# Patient Record
Sex: Female | Born: 1995
Health system: Southern US, Community
[De-identification: ages and names within clinical notes are randomized; demographics above are authoritative.]

## PROBLEM LIST (undated history)

## (undated) DIAGNOSIS — F419 Anxiety disorder, unspecified: Secondary | ICD-10-CM

## (undated) DIAGNOSIS — F32A Depression, unspecified: Secondary | ICD-10-CM

## (undated) DIAGNOSIS — M419 Scoliosis, unspecified: Secondary | ICD-10-CM

## (undated) DIAGNOSIS — T7840XA Allergy, unspecified, initial encounter: Secondary | ICD-10-CM

## (undated) DIAGNOSIS — J302 Other seasonal allergic rhinitis: Secondary | ICD-10-CM

## (undated) DIAGNOSIS — F329 Major depressive disorder, single episode, unspecified: Secondary | ICD-10-CM

## (undated) HISTORY — DX: Major depressive disorder, single episode, unspecified: F32.9

## (undated) HISTORY — DX: Allergy, unspecified, initial encounter: T78.40XA

## (undated) HISTORY — DX: Depression, unspecified: F32.A

## (undated) HISTORY — DX: Scoliosis, unspecified: M41.9

## (undated) HISTORY — DX: Anxiety disorder, unspecified: F41.9

## (undated) HISTORY — PX: WISDOM TOOTH EXTRACTION: SHX21

## (undated) HISTORY — DX: Other seasonal allergic rhinitis: J30.2

## (undated) HISTORY — PX: NO PAST SURGERIES: SHX2092

## (undated) SURGERY — Surgical Case
Anesthesia: *Unknown

---
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
## 2014-01-24 ENCOUNTER — Other Ambulatory Visit (HOSPITAL_COMMUNITY): Payer: Self-pay | Admitting: *Deleted

## 2014-01-24 DIAGNOSIS — F988 Other specified behavioral and emotional disorders with onset usually occurring in childhood and adolescence: Secondary | ICD-10-CM

## 2014-01-24 MED ORDER — ATOMOXETINE HCL 40 MG PO CAPS: 40.0000 mg | ORAL_CAPSULE | Freq: Every evening | ORAL | Status: DC

## 2014-01-24 NOTE — Telephone Encounter (Signed)
Fax request from Pharmacy requested 3 month supply of Strattera Authorized by [employer]	[place]

## 2014-02-27 ENCOUNTER — Ambulatory Visit (INDEPENDENT_AMBULATORY_CARE_PROVIDER_SITE_OTHER): Payer: 59 | Admitting: Psychiatry

## 2014-02-27 VITALS — BP 88/56 | Wt 112.4 lb

## 2014-02-27 DIAGNOSIS — F988 Other specified behavioral and emotional disorders with onset usually occurring in childhood and adolescence: Secondary | ICD-10-CM

## 2014-02-27 DIAGNOSIS — F329 Major depressive disorder, single episode, unspecified: Secondary | ICD-10-CM

## 2014-02-27 DIAGNOSIS — F411 Generalized anxiety disorder: Secondary | ICD-10-CM

## 2014-02-27 NOTE — Progress Notes (Signed)
Patient ID: Rebekah Tate, female   DOB: 03/23/1996, 18 y.o.   MRN: [id number]   [facility] Follow-up Outpatient Visit     Subjective: Patient is a 18 year old female diagnosed with ADD inattentive type, major depressive disorder who presents today for a followup visit.   Patient reports that she is doing well with her depression and anxiety.On a scale of 0-10, with 0 being no symptoms and 10 being the worst, patient reports that anxiety is a 1/10 and so is her depression. She adds that she stopped her medications about 2 weeks ago as she was doing well. She states that she wants to try off medications. She denies any suicidal thoughts, any depressive symptoms, any thoughts of self-harm or harm to others. She has that she's also doing well socially. Mom agrees with the patient.  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 She states that she's excited about going to college. She denies any problems with sleep  They both deny any other concerns, any other side effects of the medications, any safety concerns at this visit.  Active Ambulatory Problems    Diagnosis Date Noted  . Anxiety 08/29/2011  . MDD (major depressive disorder) 08/29/2011   Resolved Ambulatory Problems    Diagnosis Date Noted  . No Resolved Ambulatory Problems   Past Medical History  Diagnosis Date  . Asthma   . Seasonal allergies   . Depression    No family history on file. patient is adopted.  Social history: Patient is graduated from high school and will be starting at [employer] this fall She lives with her parents [place], [place]  No current outpatient prescriptions on file.   Review of Systems  Constitutional: Negative.  Negative for fever and weight loss.  HENT: Negative.  Negative for congestion and sore throat.   Eyes: Negative.   Respiratory: Negative.  Negative for shortness of breath and  wheezing.   Cardiovascular: Negative.  Negative for chest pain and palpitations.  Gastrointestinal: Negative.  Negative for heartburn, nausea, vomiting, abdominal pain, diarrhea, constipation, blood in stool and melena.  Genitourinary: Negative.  Negative for dysuria.  Musculoskeletal: Negative.  Negative for myalgias.  Skin: Negative.  Negative for rash.  Neurological: Negative.  Negative for dizziness, focal weakness, seizures, loss of consciousness, weakness and headaches.  Endo/Heme/Allergies: Negative.  Negative for environmental allergies.  Psychiatric/Behavioral: Negative.  Negative for depression, suicidal ideas, hallucinations and substance abuse. The patient is not nervous/anxious and does not have insomnia.    Blood pressure 88/56, weight 112 lb 6.4 oz (50.984 kg). General Appearance: alert, oriented, no acute distress and well nourished  Musculoskeletal: Strength & Muscle Tone: within normal limits Gait & Station: normal Patient leans: N/A Mental Status Examination  Appearance: Casually dressed Alert: Yes Attention: fair  Cooperative: Yes Eye Contact: Fair Speech: Normal in volume, rate, tone, spontaneous  Psychomotor Activity: Normal Memory/Concentration: OK Oriented: person, place and situation Mood: Euthymic Affect: Full Range Thought Processes and Associations: Goal Directed Fund of Knowledge: Fair Thought Content: Suicidal ideation, Homicidal ideation, Auditory hallucinations, Visual hallucinations, Delusions and Paranoia,none reported Insight: Fair to poor Judgement: Fair to poor Language: Fair  Diagnosis: Maj. depressive disorder, generalized anxiety disorder, ADHD, inattentive subtype  Treatment Plan: Discontinue Prozac and Strattera as the patient is no longer taking it Call when necessary Followup in the 6 to 8 weeks 50% of this appointment was spent discussing coping mechanisms,  symptoms of depression as patient is no longer on an antidepressant. Also  discussed ADD symptoms and treatment options at length at this visit. This visit was of moderate complexity as the patient has discontinued her medications Start time 2:50 PM Stop time 3:20 PM Nelly RoutKUMAR,Jakeia Carreras, MD

## 2014-02-28 ENCOUNTER — Encounter (HOSPITAL_COMMUNITY): Payer: Self-pay | Admitting: Psychiatry

## 2014-04-04 ENCOUNTER — Ambulatory Visit (HOSPITAL_COMMUNITY): Payer: Self-pay | Admitting: Psychiatry

## 2014-04-18 ENCOUNTER — Ambulatory Visit (INDEPENDENT_AMBULATORY_CARE_PROVIDER_SITE_OTHER): Payer: 59 | Admitting: Psychiatry

## 2014-04-18 ENCOUNTER — Encounter (HOSPITAL_COMMUNITY): Payer: Self-pay | Admitting: Psychiatry

## 2014-04-18 VITALS — BP 136/62 | HR 61 | Ht 59.0 in | Wt 114.0 lb

## 2014-04-18 DIAGNOSIS — F331 Major depressive disorder, recurrent, moderate: Secondary | ICD-10-CM

## 2014-04-18 MED ORDER — FLUOXETINE HCL 40 MG PO CAPS: 40.0000 mg | ORAL_CAPSULE | Freq: Every day | ORAL | Status: DC

## 2014-04-18 NOTE — Progress Notes (Signed)
Patient ID: Rebekah Tate, female   DOB: 1996-07-05, 18 y.o.   MRN: [id number]   [facility] Follow-up Outpatient Visit     Subjective: Patient is a 18 year old female diagnosed with ADD inattentive type, major depressive disorder who presents today for a followup visit.   Patient reports that she has been struggling with her depresion and anxiety.On a scale of 0-10, with 0 being no symptoms and 10 being the worst, patient reports that anxiety is a 5/10 and her depression is a 3/10. She adds that she is stressed about going to college and being by herself. She feels she needs to be back on the Prozac and restarted it a week ago. Mom agrees with the patient.Patient denies any other aggravating factor. She currently denies any relieving factors.She states she started feeling anxious and depressed 3 weeks ago and that it has progressively worsened.  Patient denies any feelings of not wanting to live, any feelings of hopelessness, helplessness, any self mutilating behaviors  They both deny any other concerns, any side effects of the medication, any safety concerns at this visit.  Active Ambulatory Problems    Diagnosis Date Noted  . Anxiety 08/29/2011  . MDD (major depressive disorder) 08/29/2011   Resolved Ambulatory Problems    Diagnosis Date Noted  . No Resolved Ambulatory Problems   Past Medical History  Diagnosis Date  . Asthma   . Seasonal allergies   . Depression    No family history on file. patient is adopted.  Social history: Patient will be starting at [employer] this fall She lives with her parents in [place], [place]  Current outpatient prescriptions:FLUoxetine (PROZAC) 40 MG capsule, Take 1 capsule (40 mg total) by mouth daily., Disp: 30 capsule, Rfl: 2   Review of Systems  Constitutional: Negative.  Negative for fever and weight loss.  HENT: Negative.  Negative for congestion and sore throat.   Eyes: Negative.   Respiratory: Negative.   Negative for shortness of breath and wheezing.   Cardiovascular: Negative.  Negative for chest pain and palpitations.  Gastrointestinal: Negative.  Negative for heartburn, nausea, vomiting, abdominal pain, diarrhea, constipation, blood in stool and melena.  Genitourinary: Negative.  Negative for dysuria.  Musculoskeletal: Negative.  Negative for myalgias.  Skin: Negative.  Negative for rash.  Neurological: Negative.  Negative for dizziness, focal weakness, seizures, loss of consciousness, weakness and headaches.  Endo/Heme/Allergies: Negative.  Negative for environmental allergies.  Psychiatric/Behavioral: Negative.  Negative for depression, suicidal ideas, hallucinations and substance abuse. The patient is not nervous/anxious and does not have insomnia.    Blood pressure 136/62, pulse 61, height 4\' 11"  (1.499 m), weight 114 lb (51.71 kg). General Appearance: alert, oriented, no acute distress and well nourished  Musculoskeletal: Strength & Muscle Tone: within normal limits Gait & Station: normal Patient leans: N/A Mental Status Examination  Appearance: Casually dressed Alert: Yes Attention: fair  Cooperative: Yes Eye Contact: Fair Speech: Normal in volume, rate, tone, spontaneous  Psychomotor Activity: Normal Memory/Concentration: OK Oriented: person, place and situation Mood: Euthymic Affect: Full Range Thought Processes and Associations: Goal Directed Fund of Knowledge: Fair Thought Content: Suicidal ideation, Homicidal ideation, Auditory hallucinations, Visual hallucinations, Delusions and Paranoia,none reported Insight: Fair to poor Judgement: Fair to poor Language: Fair  Diagnosis: Maj. depressive disorder, generalized anxiety disorder, ADHD, inattentive subtype  Treatment Plan:  Restart Prozac 40 MG one in the morning for anxiety and depression. The risks and benefits were discussed with mom and patient and they were agreeable  with this plan Call when  necessary Followup in 2 months 50% of this appointment was spent discussing coping mechanisms, symptoms of depression and anxiety. Discussed the need for patient to see a counselor through [employer] for a few times once she starts they're to help her transition to college and also to help with anxiety Nelly RoutKUMAR,Kyrel Leighton, MD

## 2014-08-21 ENCOUNTER — Ambulatory Visit (HOSPITAL_COMMUNITY): Payer: 59 | Admitting: Psychiatry

## 2014-08-21 ENCOUNTER — Encounter (HOSPITAL_COMMUNITY): Payer: Self-pay | Admitting: Medical

## 2014-08-21 VITALS — BP 108/65 | HR 75 | Ht 59.0 in | Wt 119.0 lb

## 2014-08-21 DIAGNOSIS — F900 Attention-deficit hyperactivity disorder, predominantly inattentive type: Secondary | ICD-10-CM

## 2014-08-21 DIAGNOSIS — F411 Generalized anxiety disorder: Secondary | ICD-10-CM

## 2014-08-21 DIAGNOSIS — F331 Major depressive disorder, recurrent, moderate: Secondary | ICD-10-CM

## 2014-08-21 MED ORDER — FLUOXETINE HCL 40 MG PO CAPS: 40.0000 mg | ORAL_CAPSULE | Freq: Every day | ORAL | Status: DC

## 2014-08-21 NOTE — Progress Notes (Addendum)
Patient ID: Rebekah Tate, female   DOB: 1995-09-23, 18 y.o.   MRN: [id number]   [facility] Follow-up Outpatient Visit     Subjective: Patient is a 18 year old female diagnosed with ADD inattentive type, major depressive disorder who presents today for a followup visit.   Patient reports that is doing better now with her depression and anxiety. She has that initially was really worse when she started college, was struggling socially and emotionally. She states that she started seeing a therapist there, has been on the Prozac and now is doing much better. She also reports that she has some friends at school.  On a scale of 0-10, with 0 being no symptoms and 10 being the worst, patient reports that anxiety is a 3/10 and her depression is a 2/10. She adds that having some friends at school has helped. She denies any aggravating factors at this time.  Patient denies any feelings of not wanting to live, any feelings of hopelessness, helplessness, any self mutilating behaviors  They both deny any other concerns, any side effects of the medication, any safety concerns at this visit.  Active Ambulatory Problems    Diagnosis Date Noted  . Anxiety 08/29/2011  . MDD (major depressive disorder) 08/29/2011   Resolved Ambulatory Problems    Diagnosis Date Noted  . No Resolved Ambulatory Problems   Past Medical History  Diagnosis Date  . Asthma   . Seasonal allergies   . Depression    No family history on file. patient is adopted.  Social history: Patient will be starting [facility] [employer] this fall She [place]  Current outpatient prescriptions: FLUoxetine (PROZAC) 40 MG capsule, Take 1 capsule (40 mg total) by mouth daily., Disp: 90 capsule, Rfl: 2   Review of Systems  Constitutional: Negative.  Negative for fever and weight loss.  HENT: Negative.  Negative for congestion and sore throat.   Eyes: Negative.   Respiratory:  Negative.  Negative for shortness of breath and wheezing.   Cardiovascular: Negative.  Negative for chest pain and palpitations.  Gastrointestinal: Negative.  Negative for heartburn, nausea, vomiting, abdominal pain, diarrhea, constipation, blood in stool and melena.  Genitourinary: Negative.  Negative for dysuria.  Musculoskeletal: Negative.  Negative for myalgias.  Skin: Negative.  Negative for rash.  Neurological: Negative.  Negative for dizziness, focal weakness, seizures, loss of consciousness, weakness and headaches.  Endo/Heme/Allergies: Negative.  Negative for environmental allergies.  Psychiatric/Behavioral: Negative.  Negative for depression, suicidal ideas, hallucinations and substance abuse. The patient is not nervous/anxious and does not have insomnia.    Blood pressure 108/65, pulse 75, height 4\' 11"  (1.499 m), weight 119 lb (53.978 kg).  General Appearance: alert, oriented, no acute distress and well nourished  Musculoskeletal: Strength & Muscle Tone: within normal limits Gait & Station: normal Patient leans: N/A Mental Status Examination  Appearance: Casually dressed Alert: Yes Attention: fair  Cooperative: Yes Eye Contact: Fair Speech: Normal in volume, rate, tone, spontaneous  Psychomotor Activity: Normal Memory/Concentration: OK Oriented: person, place and situation Mood: Euthymic Affect: Full Range Thought Processes and Associations: Goal Directed Fund of Knowledge: Fair Thought Content: Suicidal ideation, Homicidal ideation, Auditory hallucinations, Visual hallucinations, Delusions and Paranoia,none reported Insight: Fair to poor Judgement: Fair to poor Language: Fair  Diagnosis: Maj. depressive disorder, generalized anxiety disorder, ADHD, inattentive subtype  Treatment Plan:  Continue Prozac 40 MG one in the morning for anxiety and depression.  Continue to see therapist regularly at college  Call when necessary Followup in 4 months 50% of this  appointment was spent discussing coping mechanisms, social interactions along with the initial struggles with the transition to college  Nelly RoutKUMAR,Gracen Southwell, MD

## 2014-09-01 NOTE — Addendum Note (Signed)
Addended by: Nelly RoutKUMAR, Delphia Kaylor on: 09/01/2014 07:24 AM   Modules accepted: Level of Service

## 2015-01-17 ENCOUNTER — Telehealth (HOSPITAL_COMMUNITY): Payer: Self-pay

## 2015-01-17 DIAGNOSIS — F331 Major depressive disorder, recurrent, moderate: Secondary | ICD-10-CM

## 2015-01-17 NOTE — Telephone Encounter (Signed)
Telephone call with Brett CanalesSteve, pharmacist at [facility] to question appropriate conversion from 40mg  daily dosage of Fluoxetine after receiving Dr. Remus BlakeKumar's email to switch patient to weekly formula of medication due to reflux issues.  Pharmacist and Dr. Carolanne GrumblingGerald Taylor reported typically there is no real conversion that equals the 40mg  but would just begin with 90mg  dosage of weekly formula.  Message sent back to [name] to verify and to get appropriate verbal order.

## 2015-01-18 MED ORDER — FLUOXETINE HCL 90 MG PO CPDR: 90.0000 mg | DELAYED_RELEASE_CAPSULE | ORAL | Status: DC

## 2015-01-18 NOTE — Telephone Encounter (Signed)
Telephone call with [name] to verify dosage she wanted patient to take as the weekly Prozac dosage.  E-scribed in Prozac 90mg , one tablet every 7 days, #4 with 1 refill as patient is scheduled to return on 02/27/15 for her next evaluation.  Telephone call with Ms. Lindie SpruceWyatt, patient's Mother to inform the new medication was e-scribed over to [facility].  Discussed patient's current problems with gastric reflux and suggested patient take medication weekly with a piece of bread, crackers or something of that nature to help with digestion.  Requested patient keep us informed if any problems with medication change and will see again on 02/27/15.

## 2015-02-25 ENCOUNTER — Ambulatory Visit (INDEPENDENT_AMBULATORY_CARE_PROVIDER_SITE_OTHER): Payer: Self-pay | Admitting: Internal Medicine

## 2015-02-25 VITALS — BP 100/70 | HR 54 | Temp 98.4°F | Ht 59.0 in | Wt 113.1 lb

## 2015-02-25 DIAGNOSIS — H18891 Other specified disorders of cornea, right eye: Secondary | ICD-10-CM

## 2015-02-25 MED ORDER — TOBRAMYCIN-DEXAMETHASONE 0.3-0.1 % OP SUSP: 1.0000 [drp] | OPHTHALMIC | Status: AC

## 2015-02-25 NOTE — Progress Notes (Signed)
[facility] [address] [place] [address] [phone]  Date:  02/25/2015   Name:  Rebekah Tate   DOB:  [birth year]   MRN:  [id number]  PCP:  No primary care provider on file.    Chief Complaint: Eye Problem   History of Present Illness:  This is a 19 y.o. female with PMH anxiety and depression who is presenting with a "scatched cornea". States last night she was lying on the ground and her friend was pretending to step on her eye but his foot slipped and actually did step on her eye. Afterward she felt fine. A few hours later she started to have pain and irritation. She states her eye feels like "there is sand in it". She wears contacts and was wearing contacts when the incident happened. She has not worn contacts since then. Today she is stating she is no longer having pain but is still having irritation especially when blinking and closing her eyes. She does have blurred vision because of not wearing her contacts. Vision is not worse than normal without her contacts in. She is not having redness of the eye. She is not having discharge from the eye.  Review of Systems:  Review of Systems  Constitutional: Negative for fever and chills.  Eyes: Positive for pain. Negative for discharge, redness, itching and visual disturbance.  Gastrointestinal: Negative for nausea and vomiting.  Skin: Negative for color change.  Neurological: Negative for numbness.  Hematological: Negative for adenopathy.    Patient Active Problem List   Diagnosis Date Noted  . Anxiety 08/29/2011  . MDD (major depressive disorder) 08/29/2011    Prior to Admission medications   Medication Sig Start Date End Date Taking? Authorizing Provider  FLUoxetine (PROZAC WEEKLY) 90 MG DR capsule Take 1 capsule (90 mg total) by mouth every 7 (seven) days. 01/18/15 01/18/16 Yes Nelly Rout, MD  Norethindrone Acetate-Ethinyl Estrad-FE (LOESTRIN 24 FE) 1-20 MG-MCG(24) tablet Take 1 tablet by mouth  daily.   Yes Historical Provider, MD    Allergies  Allergen Reactions  . Cat Hair Extract     History reviewed. No pertinent past surgical history.  History  Substance Use Topics  . Smoking status: Never Smoker   . Smokeless tobacco: Not on file  . Alcohol Use: No    Family History  Problem Relation Age of Onset  . Adopted: Yes  . Family history unknown: Yes    Medication list has been reviewed and updated.  Physical Examination:  Physical Exam  Constitutional: She is oriented to person, place, and time. She appears well-developed and well-nourished. No distress.  HENT:  Head: Normocephalic and atraumatic.  Right Ear: Hearing normal.  Left Ear: Hearing normal.  Nose: Nose normal.  Eyes: EOM and lids are normal. Pupils are equal, round, and reactive to light. Right eye exhibits no discharge. No foreign body present in the right eye. Left eye exhibits no discharge. No foreign body present in the left eye. Right conjunctiva is injected (mildly injected). No scleral icterus.  Woods lamp with fluorscein dye did not produce uptake over the cornea  Cardiovascular: Normal rate and regular rhythm.   Pulmonary/Chest: Effort normal. No respiratory distress.  Musculoskeletal: Normal range of motion.  Neurological: She is alert and oriented to person, place, and time.  Skin: Skin is warm, dry and intact. No lesion and no rash noted.  Psychiatric: She has a normal mood and affect. Her speech is normal and behavior is normal.  Thought content normal.   BP 100/70 mmHg  Pulse 54  Temp(Src) 98.4 F (36.9 C) (Oral)  Ht 4\' 11"  (1.499 m)  Wt 113 lb 2 oz (51.313 kg)  BMI 22.84 kg/m2  SpO2 98%   Visual Acuity Screening   Right eye Left eye Both eyes  Without correction:   20 70  With correction:     Comments: Unable to complete vision screening due to inability to wear contacts.   Assessment and Plan:  1. Corneal irritation, right No corneal abrasion on woods lamp. Tobradex q 4  hours for 3 days for corneal irritation. Wears glasses instead of contacts until pain improves. Return if symptoms worsen or if not getting better in 5-7 days.  - tobramycin-dexamethasone (TOBRADEX) ophthalmic solution; Place 1 drop into the right eye every 4 (four) hours while awake.  Dispense: 5 mL; Refill: 0   Nicole V. Dyke BrackettBush, PA-C, MHS [facility]  02/25/2015  I have participated in the care of this patient with the Advanced Practice Provider and agree with Diagnosis and Plan as documented. Robert P. Merla Richesoolittle, M.D.

## 2015-02-25 NOTE — Patient Instructions (Signed)
Use eyedrops every 4 hours while awake for 3 days. Wear glasses instead of contacts. Return if not getting better in 5-7 days.

## 2015-02-27 ENCOUNTER — Ambulatory Visit (INDEPENDENT_AMBULATORY_CARE_PROVIDER_SITE_OTHER): Payer: 59 | Admitting: Psychiatry

## 2015-02-27 VITALS — BP 104/72 | Ht 59.0 in | Wt 114.0 lb

## 2015-02-27 DIAGNOSIS — F411 Generalized anxiety disorder: Secondary | ICD-10-CM

## 2015-02-27 DIAGNOSIS — F329 Major depressive disorder, single episode, unspecified: Secondary | ICD-10-CM

## 2015-02-27 DIAGNOSIS — F900 Attention-deficit hyperactivity disorder, predominantly inattentive type: Secondary | ICD-10-CM

## 2015-02-27 DIAGNOSIS — F331 Major depressive disorder, recurrent, moderate: Secondary | ICD-10-CM

## 2015-02-27 MED ORDER — FLUOXETINE HCL 90 MG PO CPDR: 90.0000 mg | DELAYED_RELEASE_CAPSULE | ORAL | Status: DC

## 2015-02-28 NOTE — Progress Notes (Signed)
Patient ID: Rebekah Tate, female   DOB: 10-22-1995, 19 y.o.   MRN: [id number]   [facility]al Health Follow-up Outpatient Visit   Date of visit: 02/27/2015  Subjective: Patient is a 19 year old female diagnosed with ADD inattentive type, major depressive disorder who presents today for a followup visit.   Patient reports that she had a good year academically but struggled a lot socially. She has that because of the social struggles, she felt that she did not fit in. She states that she's going to do the second year at [employer], see how she does socially and if she continues to struggle, plans to transfer out. She reports that her GPA is 3.8 and so she would rather transfer out if she does not have a lot of friends, and if she continues to feel she does not fit in that culture. Patient states that because of the social issues, she was struggling with depression, having at times suicidal thoughts. She reports that she's no longer having them as she is back home. She denies having any plans on acting on those thoughts.  On a scale of 0-10, with 0 being no symptoms and 10 being the worst, patient reports that currently her anxiety is a 2/10 and her depression is a 3/10. She currently denies any aggravating factors as she is back home and adds that being back home has helped with her depression and anxiety. 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 She has that she know she needs to see a therapist and wants to start back with Leanne and continue to see Leanne for therapy through her second year at [employer].  Patient reports that she's eating fine, sleeping well, denies any feelings of hopelessness, worthlessness or guilt. She also currently denies any suicidal thoughts, having thoughts of not wanting to live, any homicidal thoughts, any psychotic symptoms or any symptoms of mania.  She states that the Prozac  has helped with her depression and she plans to continue to take it. She denies any side effects of the medication, any safety issues at this visit  Active Ambulatory Problems    Diagnosis Date Noted  . Anxiety 08/29/2011  . MDD (major depressive disorder) 08/29/2011   Resolved Ambulatory Problems    Diagnosis Date Noted  . No Resolved Ambulatory Problems   Past Medical History  Diagnosis Date  . Asthma   . Seasonal allergies   . Depression   . Allergy    Family History  Problem Relation Age of Onset  . Adopted: Yes  . Family history unknown: Yes   patient is adopted.  Social history: Patient has completed her first year at [employer], plans to return back next year. She lives [place] in [place], [place]   Current outpatient prescriptions:  .  FLUoxetine (PROZAC WEEKLY) 90 MG DR capsule, Take 1 capsule (90 mg total) by mouth every 7 (seven) days., Disp: 4 capsule, Rfl: 4 .  Norethindrone Acetate-Ethinyl Estrad-FE (LOESTRIN 24 FE) 1-20 MG-MCG(24) tablet, Take 1 tablet by mouth daily., Disp: , Rfl:  .  tobramycin-dexamethasone (TOBRADEX) ophthalmic solution, Place 1 drop into the right eye every 4 (four) hours while awake., Disp: 5 mL, Rfl: 0   Review of Systems  Constitutional: Negative.  Negative for fever and weight loss.  HENT: Negative.  Negative for congestion and sore throat.   Eyes: Negative.   Respiratory: Negative.  Negative for shortness of breath and wheezing.   Cardiovascular: Negative.  Negative for chest pain and palpitations.  Gastrointestinal: Negative.  Negative for heartburn, nausea, vomiting, abdominal pain, diarrhea, constipation, blood in stool and melena.  Genitourinary: Negative.  Negative for dysuria.  Musculoskeletal: Negative.  Negative for myalgias.  Skin: Negative.  Negative for rash.  Neurological: Negative.  Negative for dizziness, focal weakness, seizures, loss of consciousness, weakness and headaches.   Endo/Heme/Allergies: Negative.  Negative for environmental allergies.  Psychiatric/Behavioral: Negative.  Negative for depression, suicidal ideas, hallucinations and substance abuse. The patient is not nervous/anxious and does not have insomnia.     General Appearance: alert, oriented, no acute distress and well nourished Blood pressure 104/72, height 4\' 11"  (1.499 m), weight 114 lb (51.71 kg).  Musculoskeletal: Strength & Muscle Tone: within normal limits Gait & Station: normal Patient leans: N/A Mental Status Examination  Appearance: Casually dressed Alert: Yes Attention: fair  Cooperative: Yes Eye Contact: Fair Speech: Normal in volume, rate, tone, spontaneous  Psychomotor Activity: Normal Memory/Concentration: OK Oriented: person, place and situation Mood: Euthymic Affect: Full Range Thought Processes and Associations: Goal Directed Fund of Knowledge: Fair Thought Content: Suicidal ideation, Homicidal ideation, Auditory hallucinations, Visual hallucinations, Delusions and Paranoia,none reported Insight: Fair to poor Judgement: Fair to poor Language: Fair  Diagnosis: Maj. depressive disorder, generalized anxiety disorder, ADHD, inattentive subtype  Treatment Plan:  Continue Prozac 40 MG one in the morning for anxiety and depression.  Continue to see therapist regularly at college Call when necessary Followup in 4 months 50% of this appointment was spent discussing coping mechanisms, symptoms of depression, crisis and safety plan if patient feels overwhelmed and has suicidal thoughts which include contacting parents, calling 911. Also discussed the need for patient to restart seeing Forde RadonLeanne Yates for individual counseling as patient is struggling socially and emotionally at college. This visit was of moderate complexity due to safety issues, obtaining information about her academic year, coming up with the safety plan, discussing coping mechanisms and the need for patient to  see a therapist on a regular basis to next school year. This visit exceeded 25 minutes Nelly RoutKUMAR,Rease Swinson, MD

## 2015-03-01 ENCOUNTER — Encounter (HOSPITAL_COMMUNITY): Payer: Self-pay | Admitting: Psychiatry

## 2015-05-03 ENCOUNTER — Ambulatory Visit (HOSPITAL_COMMUNITY): Payer: Self-pay | Admitting: Psychiatry

## 2015-06-21 ENCOUNTER — Ambulatory Visit (INDEPENDENT_AMBULATORY_CARE_PROVIDER_SITE_OTHER): Payer: 59 | Admitting: Psychiatry

## 2015-06-21 VITALS — BP 104/64 | HR 51 | Ht 60.0 in | Wt 113.0 lb

## 2015-06-21 DIAGNOSIS — F909 Attention-deficit hyperactivity disorder, unspecified type: Secondary | ICD-10-CM

## 2015-06-21 DIAGNOSIS — G4700 Insomnia, unspecified: Secondary | ICD-10-CM

## 2015-06-21 DIAGNOSIS — F331 Major depressive disorder, recurrent, moderate: Secondary | ICD-10-CM | POA: Diagnosis not present

## 2015-06-21 MED ORDER — HYDROXYZINE PAMOATE 50 MG PO CAPS: 50.0000 mg | ORAL_CAPSULE | Freq: Every evening | ORAL | Status: DC | PRN

## 2015-06-21 MED ORDER — FLUOXETINE HCL 40 MG PO CAPS: 40.0000 mg | ORAL_CAPSULE | Freq: Every day | ORAL | Status: DC

## 2015-06-21 NOTE — Progress Notes (Signed)
Patient ID: Rebekah Tate, female   DOB: 11-28-1995, 19 y.o.   MRN: [id number]   [facility] Behavioral Health Follow-up Outpatient Visit   Date of visit: 06/21/2015  Subjective: Patient is a 19 year old female diagnosed with ADD inattentive type, major depressive disorder who presents today for a followup visit.   Patient reports that she she has been struggling with her mood over the past 4 weeks, adds that she's not doing well socially or academically at school. She states that she is trying but has not been able to make good friends,that she needs to continue to work on it. She states that the once weekly Prozac does not seem to be helping with her depression. She also adds that she started seeing a therapist there, has seen her once and plans to see her on a weekly basis.  On a scale of 0-10, with 0 being no symptoms and 10 being the worst, patient reports that currently her anxiety is a 2/10 and her depression is a 6/10. She reports that being back at school, not having friends has been an aggravating factor. Patient states that she does not like changes and not having social supports at school has been hard for her. She states that she's been working hard to make friends but sometimes feels that she overdoes it. She has that she started seeing a counselor at school and plans to see the counselor weekly to help her with her coping skills her depression and her social situation.  Patient reports that she's also struggling with falling asleep at night and staying asleep. She states she needs something to help her with her sleep. She denies having any thoughts of not wanting to live, hurting herself but does report that she feels hopeless and helpless at times. She denies any self mutilating behaviors.  She states that the Prozac has helped with her depression and she feels that changing to weekly Prozac might also be a contribution factor. She has that she is okay with switching back to Prozac daily as  she did better in regards to her depression and anxiety on it.She denies any side effects of the medication, any safety issues at this visit  Active Ambulatory Problems    Diagnosis Date Noted  . Anxiety 08/29/2011  . MDD (major depressive disorder) 08/29/2011   Resolved Ambulatory Problems    Diagnosis Date Noted  . No Resolved Ambulatory Problems   Past Medical History  Diagnosis Date  . Asthma   . Seasonal allergies   . Depression   . Allergy    Family History  Problem Relation Age of Onset  . Adopted: Yes  . Family history unknown: Yes   patient is adopted.  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 She lives with her parents in [place], [place]   Current outpatient prescriptions:  .  FLUoxetine (PROZAC WEEKLY) 90 MG DR capsule, Take 1 capsule (90 mg total) by mouth every 7 (seven) days., Disp: 4 capsule, Rfl: 4 .  Norethindrone Acetate-Ethinyl Estrad-FE (LOESTRIN 24 FE) 1-20 MG-MCG(24) tablet, Take 1 tablet by mouth daily., Disp: , Rfl:    Review of Systems  Constitutional: Positive for malaise/fatigue. Negative for fever and weight loss.  HENT: Negative.  Negative for congestion and sore throat.   Eyes: Negative.  Negative for blurred vision, double vision, photophobia, discharge and redness.  Respiratory: Negative.  Negative for shortness of breath and wheezing.   Cardiovascular: Negative.  Negative for chest pain and palpitations.  Gastrointestinal: Negative.  Negative for heartburn, nausea, vomiting, abdominal pain, diarrhea, constipation, blood in stool and melena.  Genitourinary: Negative.  Negative for dysuria.  Musculoskeletal: Negative.  Negative for myalgias.  Skin: Negative.  Negative for rash.  Neurological: Negative.  Negative for dizziness, tingling, tremors, focal weakness, seizures, loss of consciousness, weakness and headaches.  Endo/Heme/Allergies: Negative.  Negative for environmental allergies.   Psychiatric/Behavioral: Positive for depression. Negative for suicidal ideas, hallucinations and substance abuse. The patient has insomnia. The patient is not nervous/anxious.     General Appearance: alert, oriented, no acute distress and well nourished Blood pressure 104/64, pulse 51, height 5' (1.524 m), weight 113 lb (51.256 kg).  Musculoskeletal: Strength & Muscle Tone: within normal limits Gait & Station: normal Patient leans: N/A Mental Status Examination  Appearance: Casually dressed Alert: Yes Attention: fair  Cooperative: Yes Eye Contact: Fair Speech: Normal in volume, rate, tone, spontaneous  Psychomotor Activity: Normal Memory/Concentration: OK Oriented: person, place and situation Mood: Depressed and Dysphoric Affect: Congruent and Tearful Thought Processes and Associations: Goal Directed Fund of Knowledge: Fair Thought Content: Suicidal ideation, Homicidal ideation, Auditory hallucinations, Visual hallucinations, Delusions and Paranoia,none reported Insight: Fair to poor Judgement: Fair to poor Language: Fair  Diagnosis: Maj. depressive disorder, generalized anxiety disorder, ADHD, inattentive subtype  Treatment Plan:  Change Prozac to 40 MG one in the morning for anxiety and depression.  Start Vistaril 50 mg at bedtime, may repeat once more if needed for sleep. The risks and benefits along with the side effects were discussed with patient and mom and they were agreeable with this plan. Also discussed sleep hygiene in length with patient at this visit Continue birth control as prescribed by her PCP Continue to see therapist weekly at college to help with her coping skills, social skills, communication skills and cognitive distortions. Call when necessary Followup in 4 weeks 50% of this appointment was spent in discussing medications for depression along with coping mechanisms, crisis and safety plan if patient feels overwhelmed and has suicidal thoughts which  include contacting parents, calling 911. Also discussed social aspects which affect patient's depression, ways to include social networking including clubs at school, other projects. This visit exceeded 25 minutes Nelly Rout, MD

## 2015-06-25 ENCOUNTER — Encounter (HOSPITAL_COMMUNITY): Payer: Self-pay | Admitting: Psychiatry

## 2015-07-12 ENCOUNTER — Ambulatory Visit (INDEPENDENT_AMBULATORY_CARE_PROVIDER_SITE_OTHER): Payer: 59 | Admitting: Psychiatry

## 2015-07-12 ENCOUNTER — Encounter (HOSPITAL_COMMUNITY): Payer: Self-pay | Admitting: Psychiatry

## 2015-07-12 VITALS — BP 121/68 | HR 66 | Ht 59.25 in | Wt 107.8 lb

## 2015-07-12 DIAGNOSIS — F411 Generalized anxiety disorder: Secondary | ICD-10-CM | POA: Diagnosis not present

## 2015-07-12 DIAGNOSIS — F329 Major depressive disorder, single episode, unspecified: Secondary | ICD-10-CM | POA: Diagnosis not present

## 2015-07-12 DIAGNOSIS — F909 Attention-deficit hyperactivity disorder, unspecified type: Secondary | ICD-10-CM | POA: Diagnosis not present

## 2015-07-12 DIAGNOSIS — F988 Other specified behavioral and emotional disorders with onset usually occurring in childhood and adolescence: Secondary | ICD-10-CM

## 2015-07-12 MED ORDER — ATOMOXETINE HCL 40 MG PO CAPS: 40.0000 mg | ORAL_CAPSULE | Freq: Every evening | ORAL | Status: DC

## 2015-07-12 NOTE — Progress Notes (Signed)
Patient ID: Lucia EstelleKathryn M Kriesel, female   DOB: 08-Jun-1996, 19 y.o.   MRN: [id number]   [facility] Follow-up Outpatient Visit   Date of visit: 07/12/2015  Subjective: Patient is a 19 year old female diagnosed with ADD inattentive type, major depressive disorder who presents today for a followup visit.   Patient reports that that she is doing better in regards to her mood, adds that she struggling with focus but other than that is doing much better. On being asked to elaborate, patient reports that her energy is better, reports that her depression has improved and that on a scale of 0-10, with 0 being no symptoms and 10 being the worst, her depression is currently a 4 out of 10. She adds that taking the Prozac regularly is helping her mood and anxiety. She denies any symptoms of activation such as racing thoughts, increased energy, decreased need for sleep. She also reports that she's eating better, adds that her sleep has improved.  In regards to therapy, patient reports that she is looking for a new therapist as occurring therapist cannot see her weekly. She feels that seeing a therapist will help with her coping skills, gets someone she can talk to regularly. She states that she knows that will help her significantly. She reports that her only aggravating factor currently is her lack of focus. She has that she was doing fairly well on the Strattera and would like to retry it.  Patient states that she is a good roommate, is working on her social issues. She adds that having a concern roommate has been helpful. She states that she plans to get this academic year a good chance so that she can complete it successfully.  She denies any side effects of the medication, any safety issues at this visit  Active Ambulatory Problems    Diagnosis Date Noted  . Anxiety 08/29/2011  . MDD (major depressive disorder) (HCC) 08/29/2011   Resolved Ambulatory Problems    Diagnosis Date Noted  . No  Resolved Ambulatory Problems   Past Medical History  Diagnosis Date  . Asthma   . Seasonal allergies   . Depression   . Allergy    Family History  Problem Relation Age of Onset  . Adopted: Yes  . Family history unknown: Yes   patient is adopted.  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 She [place] her [place]   Current outpatient prescriptions:  .  FLUoxetine (PROZAC) 40 MG capsule, Take 1 capsule (40 mg total) by mouth daily., Disp: 30 capsule, Rfl: 2 .  hydrOXYzine (VISTARIL) 50 MG capsule, Take 1 capsule (50 mg total) by mouth at bedtime and may repeat dose one time if needed., Disp: 60 capsule, Rfl: 1 .  Norethindrone Acetate-Ethinyl Estrad-FE (LOESTRIN 24 FE) 1-20 MG-MCG(24) tablet, Take 1 tablet by mouth daily., Disp: , Rfl:    Review of Systems  Constitutional: Positive for malaise/fatigue. Negative for fever and weight loss.  HENT: Negative.  Negative for congestion and sore throat.   Eyes: Negative.  Negative for blurred vision, double vision, photophobia, discharge and redness.  Respiratory: Negative.  Negative for shortness of breath and wheezing.   Cardiovascular: Negative.  Negative for chest pain and palpitations.  Gastrointestinal: Negative.  Negative for heartburn, nausea, vomiting, abdominal pain, diarrhea, constipation, blood in stool and melena.  Genitourinary: Negative.  Negative for dysuria.  Musculoskeletal: Negative.  Negative for myalgias.  Skin: Negative.  Negative for rash.  Neurological: Negative.  Negative for dizziness, tingling, tremors, focal weakness, seizures, loss of consciousness, weakness and headaches.  Endo/Heme/Allergies: Negative.  Negative for environmental allergies.  Psychiatric/Behavioral: Positive for depression. Negative for suicidal ideas, hallucinations and substance abuse. The patient is not nervous/anxious and does not have insomnia.        Concentration    General  Appearance: alert, oriented, no acute distress and well nourished There were no vitals taken for this visit.  Musculoskeletal: Strength & Muscle Tone: within normal limits Gait & Station: normal Patient leans: N/A Mental Status Examination  Appearance: Casually dressed Alert: Yes Attention: fair  Cooperative: Yes Eye Contact: Fair Speech: Normal in volume, rate, tone, spontaneous  Psychomotor Activity: Normal Memory/Concentration: OK Oriented: person, place and situation Mood: Depressed and Dysphoric Affect: Congruent and Tearful Thought Processes and Associations: Goal Directed Fund of Knowledge: Fair Thought Content: Suicidal ideation, Homicidal ideation, Auditory hallucinations, Visual hallucinations, Delusions and Paranoia,none reported Insight: Fair to poor Judgement: Fair to poor Language: Fair  Diagnosis: Maj. depressive disorder, generalized anxiety disorder, ADHD, inattentive subtype  Treatment Plan:  Continue Prozac 40 MG one in the morning for anxiety and depression.  Continue Vistaril 50 mg one at bedtime as needed for sleep, may repeat once more. To start Strattera 40 mg 1 in the evening to help with focus. The risks and benefits along with the side effects were discussed with patient and she was agreeable with this plan. Continue birth control as prescribed by her PCP Start seeing a therapist weekly to help with her coping skills, social skills, communication skills and cognitive distortions. Call when necessary Followup in 4 weeks 50% of this appointment was spent in discussing medications for concentration as patient is diagnosed with ADD, the need for weekly therapy to help with her coping skills and social skills, the crisis and safety plan was also discussed in length even though patient denied any suicidal thoughts at this visit Nelly Rout, MD

## 2015-07-19 ENCOUNTER — Telehealth (HOSPITAL_COMMUNITY): Payer: Self-pay

## 2015-07-19 ENCOUNTER — Other Ambulatory Visit (HOSPITAL_COMMUNITY): Payer: Self-pay | Admitting: Psychiatry

## 2015-07-19 DIAGNOSIS — F988 Other specified behavioral and emotional disorders with onset usually occurring in childhood and adolescence: Secondary | ICD-10-CM

## 2015-07-19 MED ORDER — DEXMETHYLPHENIDATE HCL ER 10 MG PO CP24: 10.0000 mg | ORAL_CAPSULE | Freq: Every day | ORAL | Status: DC

## 2015-07-19 NOTE — Telephone Encounter (Signed)
Discontinue Strattera as patient is having reflux on it and to start Focalin XR 10 mg 1 in the morning. This was discussed with patient over the phone.

## 2015-07-19 NOTE — Telephone Encounter (Signed)
07/19/15 3:26PM Patient's mother came and pick-up rx script  [id number].Marland Kitchen.Marguerite Olea/sh

## 2015-09-10 ENCOUNTER — Encounter (HOSPITAL_COMMUNITY): Payer: Self-pay | Admitting: Psychiatry

## 2015-09-10 ENCOUNTER — Ambulatory Visit (INDEPENDENT_AMBULATORY_CARE_PROVIDER_SITE_OTHER): Payer: 59 | Admitting: Psychiatry

## 2015-09-10 VITALS — BP 118/83 | HR 63 | Ht 59.0 in | Wt 110.0 lb

## 2015-09-10 DIAGNOSIS — F909 Attention-deficit hyperactivity disorder, unspecified type: Secondary | ICD-10-CM

## 2015-09-10 DIAGNOSIS — F331 Major depressive disorder, recurrent, moderate: Secondary | ICD-10-CM

## 2015-09-10 DIAGNOSIS — F988 Other specified behavioral and emotional disorders with onset usually occurring in childhood and adolescence: Secondary | ICD-10-CM

## 2015-09-10 MED ORDER — FLUOXETINE HCL 20 MG PO CAPS: 60.0000 mg | ORAL_CAPSULE | Freq: Every day | ORAL | Status: DC

## 2015-09-10 MED ORDER — DEXMETHYLPHENIDATE HCL ER 10 MG PO CP24: 10.0000 mg | ORAL_CAPSULE | Freq: Every day | ORAL | Status: DC

## 2015-09-10 NOTE — Progress Notes (Signed)
Patient ID: Rebekah Tate, female   DOB: 02-19-1996, 19 y.o.   MRN: [id number]   [facility]up Out[facility] Visit   Date of visit: 09/10/2015  Subjective: Patient is a 19 year old female diagnosed with ADD inattentive type, major depressive disorder who presents today for a followup visit.   Patient reports that her depression has worsened since her head injury and concussion. She states that prior to the concussion her depression on a scale of 0-10, with 0 being no symptoms in 10 being the worst was a 3 out of 10 and after the concussion became an 8 or 9 out of 10. She states that not being able to focus, having to be off the Focalin, not being able to complete her assignments worsened her depression. She states that since she's been back home, her depression is now a 7 out of 10. She also adds that not having friends at school has worsened her depression and reports that being back home around friends is helpful. Patient also reports that she last cut herself a week and a half ago, adds that she was frustrated with the work missed, how much she had to catch up. She states that going to [place] is going to be a relieving factor, adds that school is an aggravating factor as she does not have friends at [employer].  Patient reports that she is going to start seeing new therapist in [place] after she returns back on a regular basis. th her coping skills, gets someone she can talk to regularly. She states that she knows that will help her significantly.   Patient states that she is a good roommate, adds that she is not socializing with her and really does not have friends at school. She states that she has plans on how she can make friends, feels that seeing a therapist on a weekly basis will help her with that.   She denies any side effects of the medication, any safety issues at this visit  Active Ambulatory Problems    Diagnosis Date Noted  . Anxiety 08/29/2011  . MDD (major  depressive disorder) (HCC) 08/29/2011   Resolved Ambulatory Problems    Diagnosis Date Noted  . No Resolved Ambulatory Problems   Past Medical History  Diagnosis Date  . Asthma   . Seasonal allergies   . Depression   . Allergy    Family History  Problem Relation Age of Onset  . Adopted: Yes  . Family history unknown: Yes   patient is adopted.  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 [place]   Current outpatient prescriptions:  .  albuterol (PROVENTIL HFA) 108 (90 BASE) MCG/ACT inhaler, Inhale 2 puffs into the lungs daily as needed for wheezing or shortness of breath., Disp: , Rfl:  .  dexmethylphenidate (FOCALIN XR) 10 MG 24 hr capsule, Take 1 capsule (10 mg total) by mouth daily., Disp: 90 capsule, Rfl: 0 .  hydrOXYzine (VISTARIL) 50 MG capsule, Take 1 capsule (50 mg total) by mouth at bedtime and may repeat dose one time if needed., Disp: 60 capsule, Rfl: 1 .  Norethindrone Acetate-Ethinyl Estrad-FE (LOESTRIN 24 FE) 1-20 MG-MCG(24) tablet, Take 1 tablet by mouth daily., Disp: , Rfl:  .  atovaquone-proguanil (MALARONE) 250-100 MG TABS tablet, , Disp: , Rfl: 0 .  ciprofloxacin (CIPRO) 500 MG tablet, , Disp: , Rfl: 0 .  FLUoxetine (PROZAC) 20 MG capsule, Take 3 capsules (60 mg  total) by mouth daily., Disp: 90 capsule, Rfl: 2   Review of Systems  Constitutional: Positive for malaise/fatigue. Negative for fever and weight loss.  HENT: Negative.  Negative for congestion and sore throat.   Eyes: Negative.  Negative for blurred vision, double vision, photophobia, discharge and redness.  Respiratory: Negative.  Negative for shortness of breath and wheezing.   Cardiovascular: Negative.  Negative for chest pain and palpitations.  Gastrointestinal: Negative.  Negative for heartburn, nausea, vomiting, abdominal pain, diarrhea, constipation, blood in stool and melena.  Genitourinary: Negative.  Negative for  dysuria.  Musculoskeletal: Negative.  Negative for myalgias.  Skin: Negative.  Negative for rash.  Neurological: Negative for dizziness, tingling, tremors, focal weakness, seizures, loss of consciousness, weakness and headaches.       Had head injury with concussion in the first week of november  Endo/Heme/Allergies: Negative.  Negative for environmental allergies.  Psychiatric/Behavioral: Positive for depression. Negative for suicidal ideas, hallucinations, memory loss and substance abuse. The patient is nervous/anxious. The patient does not have insomnia.     General Appearance: alert, oriented, no acute distress and well nourished Blood pressure 118/83, pulse 63, height  (1.499 m), weight 110 lb (49.896 kg).  Musculoskeletal: Strength & Muscle Tone: within normal limits Gait & Station: normal Patient leans: N/A Mental Status Examination  Appearance: Casually dressed Alert: Yes Attention: fair  Cooperative: Yes Eye Contact: Fair Speech: Normal in volume, rate, tone, spontaneous  Psychomotor Activity: Normal Memory/Concentration: OK Oriented: person, place and situation Mood: Depressed and Dysphoric Affect: Congruent and Tearful Thought Processes and Associations: Goal Directed Fund of Knowledge: Fair Thought Content: Suicidal ideation, Homicidal ideation, Auditory hallucinations, Visual hallucinations, Delusions and Paranoia,none reported Insight: Fair to poor Judgement: Fair to poor Language: Fair  Diagnosis: Maj. depressive disorder, generalized anxiety disorder, ADHD, inattentive subtype  Treatment Plan:  Increase Prozac to 60 mg, take 320 mg tablets in the morning for anxiety and depression.  Continue Vistaril 50 mg one at bedtime as needed for sleep, may repeat once more. Continue Focalin XR 10 mg 1 in the morning for ADD inattentive type Continue birth control as prescribed by her PCP Start seeing a therapist weekly to help with her coping skills, social  skills, communication skills and cognitive distortions. Call when necessary Followup in 4 weeks 50% of this appointment was spent in discussing medications for depression which include Effexor XR, Remeron, Lexapro. Patient prefers to increase the Prozac at this time as she is going to be out of the country for 3 weeks in [place] and does not want to make major changes in her medication. Also discussed coping strategies in length at this visit along with a safety plan even though patient does not endorse suicidal ideation. Discussed time management and organizational skills to help patient stay on task, complete her work before she goes on her trip. Discussed coping mechanisms in regards to anxiety and depression so patient does not cut again. This visit was of moderate complexity and exceeded thirty minutes  Nelly Rout, MD

## 2015-10-25 ENCOUNTER — Telehealth (HOSPITAL_COMMUNITY): Payer: Self-pay

## 2015-10-25 ENCOUNTER — Other Ambulatory Visit (HOSPITAL_COMMUNITY): Payer: Self-pay | Admitting: Psychiatry

## 2015-10-25 NOTE — Telephone Encounter (Signed)
Medication management - Telephone call with pt's Mother to follow up on message she was in need of a new Focalin XR order.  Informed Dr. Lucianne Muss had given pt. a 90 day order 09/09/16.  Ms. Horseman reported pt. had been out of the country and may have it in her room as states she did not take her medication with her when she went out of country. Mrs. Parcell reported plan to look for the 90 day prescription in patient's room and will call back if any problems of if unable to locate.

## 2015-10-26 ENCOUNTER — Telehealth (HOSPITAL_COMMUNITY): Payer: Self-pay

## 2015-10-26 ENCOUNTER — Other Ambulatory Visit (HOSPITAL_COMMUNITY): Payer: Self-pay | Admitting: Psychiatry

## 2015-10-26 DIAGNOSIS — F988 Other specified behavioral and emotional disorders with onset usually occurring in childhood and adolescence: Secondary | ICD-10-CM

## 2015-10-26 MED ORDER — DEXMETHYLPHENIDATE HCL ER 10 MG PO CP24: 10.0000 mg | ORAL_CAPSULE | Freq: Every day | ORAL | Status: DC

## 2015-10-26 NOTE — Telephone Encounter (Signed)
Refill for focalin XR done 

## 2015-10-26 NOTE — Telephone Encounter (Signed)
Nettie Elm called this patients mother to let her know that the rx for Focalin was ready for pick up and patients mother informed her that they had found the old one and were able to get it filled. I have voided the rx and will let Dr. Lucianne Muss know.

## 2015-10-26 NOTE — Telephone Encounter (Signed)
Dr. Lucianne Muss came to my office and asked that I reprint this patients rx, apparently she had printed it but it was not up front and she was already logged out. I printed the new rx.

## 2015-10-29 DIAGNOSIS — F4323 Adjustment disorder with mixed anxiety and depressed mood: Secondary | ICD-10-CM | POA: Diagnosis not present

## 2015-11-08 DIAGNOSIS — F4323 Adjustment disorder with mixed anxiety and depressed mood: Secondary | ICD-10-CM | POA: Diagnosis not present

## 2015-12-31 DIAGNOSIS — M25551 Pain in right hip: Secondary | ICD-10-CM | POA: Diagnosis not present

## 2015-12-31 DIAGNOSIS — M25552 Pain in left hip: Secondary | ICD-10-CM | POA: Diagnosis not present

## 2015-12-31 DIAGNOSIS — M545 Low back pain: Secondary | ICD-10-CM | POA: Diagnosis not present

## 2015-12-31 DIAGNOSIS — M25561 Pain in right knee: Secondary | ICD-10-CM | POA: Diagnosis not present

## 2016-01-02 DIAGNOSIS — M25561 Pain in right knee: Secondary | ICD-10-CM | POA: Diagnosis not present

## 2016-01-02 DIAGNOSIS — M25552 Pain in left hip: Secondary | ICD-10-CM | POA: Diagnosis not present

## 2016-01-02 DIAGNOSIS — M25551 Pain in right hip: Secondary | ICD-10-CM | POA: Diagnosis not present

## 2016-01-02 DIAGNOSIS — M545 Low back pain: Secondary | ICD-10-CM | POA: Diagnosis not present

## 2016-01-09 DIAGNOSIS — M25552 Pain in left hip: Secondary | ICD-10-CM | POA: Diagnosis not present

## 2016-01-09 DIAGNOSIS — M25561 Pain in right knee: Secondary | ICD-10-CM | POA: Diagnosis not present

## 2016-01-09 DIAGNOSIS — M545 Low back pain: Secondary | ICD-10-CM | POA: Diagnosis not present

## 2016-01-09 DIAGNOSIS — M25551 Pain in right hip: Secondary | ICD-10-CM | POA: Diagnosis not present

## 2016-01-11 DIAGNOSIS — M545 Low back pain: Secondary | ICD-10-CM | POA: Diagnosis not present

## 2016-01-11 DIAGNOSIS — M25561 Pain in right knee: Secondary | ICD-10-CM | POA: Diagnosis not present

## 2016-01-11 DIAGNOSIS — M25551 Pain in right hip: Secondary | ICD-10-CM | POA: Diagnosis not present

## 2016-01-11 DIAGNOSIS — M25552 Pain in left hip: Secondary | ICD-10-CM | POA: Diagnosis not present

## 2016-01-14 DIAGNOSIS — M25551 Pain in right hip: Secondary | ICD-10-CM | POA: Diagnosis not present

## 2016-01-14 DIAGNOSIS — M25552 Pain in left hip: Secondary | ICD-10-CM | POA: Diagnosis not present

## 2016-01-14 DIAGNOSIS — M545 Low back pain: Secondary | ICD-10-CM | POA: Diagnosis not present

## 2016-01-14 DIAGNOSIS — M25561 Pain in right knee: Secondary | ICD-10-CM | POA: Diagnosis not present

## 2016-01-17 DIAGNOSIS — M545 Low back pain: Secondary | ICD-10-CM | POA: Diagnosis not present

## 2016-01-17 DIAGNOSIS — M25561 Pain in right knee: Secondary | ICD-10-CM | POA: Diagnosis not present

## 2016-01-17 DIAGNOSIS — M25551 Pain in right hip: Secondary | ICD-10-CM | POA: Diagnosis not present

## 2016-01-17 DIAGNOSIS — M25552 Pain in left hip: Secondary | ICD-10-CM | POA: Diagnosis not present

## 2016-01-23 DIAGNOSIS — M25561 Pain in right knee: Secondary | ICD-10-CM | POA: Diagnosis not present

## 2016-01-23 DIAGNOSIS — M545 Low back pain: Secondary | ICD-10-CM | POA: Diagnosis not present

## 2016-01-23 DIAGNOSIS — M25551 Pain in right hip: Secondary | ICD-10-CM | POA: Diagnosis not present

## 2016-01-23 DIAGNOSIS — M25552 Pain in left hip: Secondary | ICD-10-CM | POA: Diagnosis not present

## 2016-02-27 DIAGNOSIS — M545 Low back pain: Secondary | ICD-10-CM | POA: Diagnosis not present

## 2016-02-27 DIAGNOSIS — M25561 Pain in right knee: Secondary | ICD-10-CM | POA: Diagnosis not present

## 2016-02-27 DIAGNOSIS — F419 Anxiety disorder, unspecified: Secondary | ICD-10-CM | POA: Diagnosis not present

## 2016-02-27 DIAGNOSIS — F329 Major depressive disorder, single episode, unspecified: Secondary | ICD-10-CM | POA: Diagnosis not present

## 2016-02-27 DIAGNOSIS — M25551 Pain in right hip: Secondary | ICD-10-CM | POA: Diagnosis not present

## 2016-02-27 DIAGNOSIS — M25552 Pain in left hip: Secondary | ICD-10-CM | POA: Diagnosis not present

## 2016-04-03 DIAGNOSIS — F419 Anxiety disorder, unspecified: Secondary | ICD-10-CM | POA: Diagnosis not present

## 2016-04-03 DIAGNOSIS — F329 Major depressive disorder, single episode, unspecified: Secondary | ICD-10-CM | POA: Diagnosis not present

## 2016-04-28 DIAGNOSIS — F329 Major depressive disorder, single episode, unspecified: Secondary | ICD-10-CM | POA: Diagnosis not present

## 2016-04-28 DIAGNOSIS — F419 Anxiety disorder, unspecified: Secondary | ICD-10-CM | POA: Diagnosis not present

## 2016-05-15 ENCOUNTER — Ambulatory Visit (INDEPENDENT_AMBULATORY_CARE_PROVIDER_SITE_OTHER): Payer: 59 | Admitting: Psychiatry

## 2016-05-15 ENCOUNTER — Encounter (HOSPITAL_COMMUNITY): Payer: Self-pay | Admitting: Psychiatry

## 2016-05-15 VITALS — BP 102/66 | HR 62 | Ht 60.0 in | Wt 113.6 lb

## 2016-05-15 DIAGNOSIS — G4700 Insomnia, unspecified: Secondary | ICD-10-CM | POA: Diagnosis not present

## 2016-05-15 DIAGNOSIS — F988 Other specified behavioral and emotional disorders with onset usually occurring in childhood and adolescence: Secondary | ICD-10-CM | POA: Insufficient documentation

## 2016-05-15 DIAGNOSIS — F909 Attention-deficit hyperactivity disorder, unspecified type: Secondary | ICD-10-CM

## 2016-05-15 DIAGNOSIS — F331 Major depressive disorder, recurrent, moderate: Secondary | ICD-10-CM

## 2016-05-15 MED ORDER — HYDROXYZINE PAMOATE 100 MG PO CAPS: 100.0000 mg | ORAL_CAPSULE | Freq: Every day | ORAL | 1 refills | Status: DC

## 2016-05-15 MED ORDER — DEXMETHYLPHENIDATE HCL ER 10 MG PO CP24: 10.0000 mg | ORAL_CAPSULE | Freq: Every day | ORAL | 0 refills | Status: DC

## 2016-05-15 NOTE — Patient Instructions (Signed)
Work on making a list and realistic goals

## 2016-05-16 NOTE — Progress Notes (Signed)
Patient ID: HONI NAME, female   DOB: 1995/10/11, 20 y.o.   MRN: [id number]   [facility] Follow-up Outpatient Visit   Date of visit: 05/15/2016  Subjective: Patient is a 20 year old female diagnosed with ADD inattentive type, major depressive disorder who presents today for a followup visit.   Patient reports that her depression and anxiety are much better as she is no longer at [facility]. She states that she's taking any and year off, plans to look at school options in [place]. She reports that she is currently working at [employer], helping with the field hockey team at [employer]. 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 She states that she takes on more than she can handle, is working on managing her time better, saying no rather than getting overwhelmed.   On a scale of 0-10, with 0 being no symptoms in 10 being the worst , patient reports that her depression is a 2 out of 10 . She states that she's been spending the summer with her friends, is afraid that everyone will be going back to college now, is concerned that she might get overwhelmed. She reports that she is busy with work, has made some friends at work and is hoping that she does fairly well.  Patient states that she's seeing a therapist in [place] every other week, likes her.  In regards to home, patient reports her parents are supportive but she is concerned about living at home. She has that she's not had any any problems this summer..   She denies any side effects of the medication, any safety issues at this visit  Active Ambulatory Problems    Diagnosis Date Noted  . Anxiety 08/29/2011  . MDD (major depressive disorder) (HCC) 08/29/2011  . ADD (attention deficit disorder) without hyperactivity 05/15/2016   Resolved Ambulatory Problems    Diagnosis Date Noted  . No Resolved Ambulatory Problems   Past  Medical History:  Diagnosis Date  . Allergy   . Anxiety   . Asthma   . Depression   . Seasonal allergies    Family History  Problem Relation Age of Onset  . Adopted: Yes  . Family history unknown: Yes   patient is adopted.  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 She lives [place] her parents in [place]   Current Outpatient Prescriptions:  .  albuterol (PROVENTIL HFA) 108 (90 BASE) MCG/ACT inhaler, Inhale 2 puffs into the lungs daily as needed for wheezing or shortness of breath., Disp: , Rfl:  .  atovaquone-proguanil (MALARONE) 250-100 MG TABS tablet, , Disp: , Rfl: 0 .  ciprofloxacin (CIPRO) 500 MG tablet, , Disp: , Rfl: 0 .  dexmethylphenidate (FOCALIN XR) 10 MG 24 hr capsule, Take 1 capsule (10 mg total) by mouth daily., Disp: 90 capsule, Rfl: 0 .  FLUoxetine (PROZAC) 20 MG capsule, Take 3 capsules (60 mg total) by mouth daily., Disp: 90 capsule, Rfl: 2 .  hydrOXYzine (VISTARIL) 100 MG capsule, Take 1 capsule (100 mg total) by mouth at bedtime., Disp: 30 capsule, Rfl: 1 .  Norethindrone Acetate-Ethinyl Estrad-FE (LOESTRIN 24 FE) 1-20 MG-MCG(24) tablet, Take 1 tablet by mouth daily., Disp: , Rfl:    Review of Systems  Constitutional: Negative.  Negative for fever, malaise/fatigue and weight loss.  HENT: Negative.  Negative for congestion and sore throat.   Eyes: Negative.  Negative for blurred vision, double vision, photophobia, discharge and redness.  Respiratory: Negative.  Negative for shortness of breath and wheezing.   Cardiovascular: Negative.  Negative for chest pain and palpitations.  Gastrointestinal: Negative.  Negative for abdominal pain, blood in stool, constipation, diarrhea, heartburn, melena, nausea and vomiting.  Genitourinary: Negative.  Negative for dysuria.  Musculoskeletal:  Negative.  Negative for myalgias.  Skin: Negative.  Negative for rash.  Neurological: Negative for dizziness, tingling, tremors, focal weakness, seizures, loss of consciousness, weakness and headaches.       Had head injury with concussion in the first week of november  Endo/Heme/Allergies: Negative.  Negative for environmental allergies.  Psychiatric/Behavioral: Negative for depression, hallucinations, memory loss, substance abuse and suicidal ideas. The patient is not nervous/anxious and does not have insomnia.     General Appearance: alert, oriented, no acute distress and well nourished Blood pressure 102/66, pulse 62, height 5' (1.524 m), weight 113 lb 9.6 oz (51.5 kg).  Musculoskeletal: Strength & Muscle Tone: within normal limits Gait & Station: normal Patient leans: N/A Mental Status Examination  Appearance: Casually dressed Alert: Yes Attention: fair  Cooperative: Yes Eye Contact: Fair Speech: Normal in volume, rate, tone, spontaneous  Psychomotor Activity: Normal Memory/Concentration: OK Oriented: person, place and situation Mood: Depressed and Dysphoric Affect: Congruent and Tearful Thought Processes and Associations: Goal Directed Fund of Knowledge: Fair Thought Content: Suicidal ideation, Homicidal ideation, Auditory hallucinations, Visual hallucinations, Delusions and Paranoia,none reported Insight: Fair to poor Judgement: Fair to poor Language: Fair  Diagnosis: Maj. depressive disorder, generalized anxiety disorder, ADHD, inattentive subtype  Treatment Plan:  Continue Prozac 60 mg, take three 20 mg tablets in the morning for anxiety and depression.  Continue Vistaril 50 mg one at bedtime as needed for sleep, may repeat once more if needed Continue Focalin XR 10 mg 1 in the morning for ADD inattentive type Continue birth control as prescribed by her PCP Start seeing a therapist weekly to help with her coping skills, social skills, communication skills and  cognitive distortions. Call when necessary Followup in 2 months 50% of this appointment was spent in discussing the need to take medications regularly, the need to start seeing a therapist on a regular basis every other week, the need to work on coping skills, stress management. Also discussed in length with patient that she needs to not say yes to everything, better manage her time and work on schooling options. Crisis and safety plan was also discussed in length along with her moving back home to live with parents. Nelly RoutKUMAR,Chesky Heyer, MD

## 2016-06-05 DIAGNOSIS — F329 Major depressive disorder, single episode, unspecified: Secondary | ICD-10-CM | POA: Diagnosis not present

## 2016-06-05 DIAGNOSIS — F419 Anxiety disorder, unspecified: Secondary | ICD-10-CM | POA: Diagnosis not present

## 2016-06-16 ENCOUNTER — Other Ambulatory Visit (HOSPITAL_COMMUNITY): Payer: Self-pay | Admitting: Psychiatry

## 2016-06-16 DIAGNOSIS — F331 Major depressive disorder, recurrent, moderate: Secondary | ICD-10-CM

## 2016-06-20 DIAGNOSIS — F329 Major depressive disorder, single episode, unspecified: Secondary | ICD-10-CM | POA: Diagnosis not present

## 2016-06-20 DIAGNOSIS — Z113 Encounter for screening for infections with a predominantly sexual mode of transmission: Secondary | ICD-10-CM | POA: Diagnosis not present

## 2016-06-20 DIAGNOSIS — F419 Anxiety disorder, unspecified: Secondary | ICD-10-CM | POA: Diagnosis not present

## 2016-06-20 DIAGNOSIS — Z6822 Body mass index (BMI) 22.0-22.9, adult: Secondary | ICD-10-CM | POA: Diagnosis not present

## 2016-06-20 DIAGNOSIS — Z01419 Encounter for gynecological examination (general) (routine) without abnormal findings: Secondary | ICD-10-CM | POA: Diagnosis not present

## 2016-06-26 ENCOUNTER — Ambulatory Visit (HOSPITAL_COMMUNITY): Payer: Self-pay | Admitting: Psychiatry

## 2016-07-24 ENCOUNTER — Ambulatory Visit (INDEPENDENT_AMBULATORY_CARE_PROVIDER_SITE_OTHER): Payer: 59 | Admitting: Sports Medicine

## 2016-07-24 ENCOUNTER — Encounter: Payer: Self-pay | Admitting: Sports Medicine

## 2016-07-24 ENCOUNTER — Ambulatory Visit: Payer: Self-pay

## 2016-07-24 ENCOUNTER — Ambulatory Visit
Admission: RE | Admit: 2016-07-24 | Discharge: 2016-07-24 | Disposition: A | Discharge status: Home / Self Care | Payer: 59 | Source: Ambulatory Visit | Attending: Sports Medicine | Admitting: Sports Medicine

## 2016-07-24 VITALS — BP 124/81 | HR 69 | Ht 60.0 in | Wt 107.0 lb

## 2016-07-24 DIAGNOSIS — M67952 Unspecified disorder of synovium and tendon, left thigh: Secondary | ICD-10-CM | POA: Diagnosis not present

## 2016-07-24 DIAGNOSIS — M25552 Pain in left hip: Secondary | ICD-10-CM

## 2016-07-24 DIAGNOSIS — M62838 Other muscle spasm: Secondary | ICD-10-CM | POA: Diagnosis not present

## 2016-07-24 DIAGNOSIS — M4185 Other forms of scoliosis, thoracolumbar region: Secondary | ICD-10-CM | POA: Diagnosis not present

## 2016-07-24 DIAGNOSIS — M4120 Other idiopathic scoliosis, site unspecified: Secondary | ICD-10-CM | POA: Insufficient documentation

## 2016-07-24 DIAGNOSIS — M79606 Pain in leg, unspecified: Secondary | ICD-10-CM | POA: Insufficient documentation

## 2016-07-24 MED ORDER — MELOXICAM 7.5 MG PO TABS: ORAL_TABLET | ORAL | 2 refills | Status: DC

## 2016-07-24 NOTE — Assessment & Plan Note (Signed)
We will start with some shakeout exercises and try to reduce the trapezius spasm

## 2016-07-24 NOTE — Progress Notes (Signed)
  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 About one year ago she started having left lateral hip pain This will sometimes radiate down to her knee Sometimes it causes tightness in the left low back She rarely has any significant low back pain   this would bother her after too much exercise she does not remember a specific injury Now she gets pain with walking She has pain with standing too long She gets actually less pain when she runs  Shoulder tightness is chronic She knows that her left shoulder looks higher when she sits or stands Thought to relate to tension No known injury  Social history Never a smoker [employer] student Mother is a Warden/rangerpsychologist with pediatrics  Past history Attention deficit disorder Anxiety Maj. Depression  Review of systems Denies any sciatica No low back pain No cough or sneeze pain No neck pain  Physical exam Physically fit young female in no acute distress BP 124/81   Pulse 69   Ht 5' (1.524 m)   Wt 107 lb (48.5 kg)   BMI 20.90 kg/m   Both hips show a normal range of motion Hip flexion is 5 out of 5 bilaterally Quadriceps strength is 5 out of 5 bilaterally Hip abduction on the right is 5 out of 5 Hip abduction on the left is barely 4/5 Gluteus maximum the left and tensor fascia lata are strong Tenderness over the greater trochanter  Examination of the upper back reveals a probable thoracic curve Right side of the back sits higher  spasm over the left trapezius  Ultrasound of left lateral hip Attachment of the gluteus medius at the iliac crest is normal Attachment of the gluteus medius and minimus tendons at the greater trochanter appears normal There is some slight hypoechoic fluid surrounding the tendons on longitudinal view On transverse view there is hypoechoic change suggestive  of fluid just beneath the combined tendons at the insertion to the greater trochanter 2 cm proximal to the greater trochanter there is some hypoechoic change and transverse scans significant for some chronic tendinopathy  Impression; 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

## 2016-07-24 NOTE — Assessment & Plan Note (Signed)
Home exercise program to emphasize strengthening of the gluteus medius  Mobic 7.5 mg x14 days and then when necessary  Recheck this in 6 weeks to assess for healing

## 2016-07-30 ENCOUNTER — Other Ambulatory Visit: Payer: Self-pay | Admitting: *Deleted

## 2016-07-30 DIAGNOSIS — M62838 Other muscle spasm: Secondary | ICD-10-CM

## 2016-08-04 ENCOUNTER — Telehealth (HOSPITAL_COMMUNITY): Payer: Self-pay | Admitting: Psychiatry

## 2016-08-05 ENCOUNTER — Ambulatory Visit (INDEPENDENT_AMBULATORY_CARE_PROVIDER_SITE_OTHER): Payer: 59 | Admitting: Psychiatry

## 2016-08-05 ENCOUNTER — Encounter (HOSPITAL_COMMUNITY): Payer: Self-pay | Admitting: Psychiatry

## 2016-08-05 VITALS — BP 110/68 | HR 64 | Ht 59.75 in | Wt 104.0 lb

## 2016-08-05 DIAGNOSIS — F322 Major depressive disorder, single episode, severe without psychotic features: Secondary | ICD-10-CM | POA: Diagnosis not present

## 2016-08-05 DIAGNOSIS — F411 Generalized anxiety disorder: Secondary | ICD-10-CM | POA: Diagnosis not present

## 2016-08-05 DIAGNOSIS — F900 Attention-deficit hyperactivity disorder, predominantly inattentive type: Secondary | ICD-10-CM

## 2016-08-05 DIAGNOSIS — F5101 Primary insomnia: Secondary | ICD-10-CM | POA: Diagnosis not present

## 2016-08-05 MED ORDER — TRAZODONE HCL 50 MG PO TABS: 50.0000 mg | ORAL_TABLET | Freq: Every evening | ORAL | 1 refills | Status: DC | PRN

## 2016-08-05 NOTE — Progress Notes (Signed)
Patient ID: Rebekah Tate, female   DOB: 1996-06-02, 20 y.o.   MRN: [id number]   [facility]   Date of visit: 08/05/2016  Subjective: Patient is a 20 year old female diagnosed with ADD inattentive type, major depressive disorder who presents today for an urgent follow-up visit along with mom due to worsening of depression, suicidal ideation with no plans and feeling overwhelmed.  Patient reports that her depression and anxiety have gotten worse over the past week, adds that she's been having on and off thoughts of not wanting to live, ending her life but no plans. She also reports that she cut herself a few weeks ago, adds that she's not done it since then. She states that cutting does help relieve her stress.   On a scale of 0-10, with 0 being no symptoms in 10 being the worst , patient reports that her depression is a 8 out of 10 and her anxiety is a 7 out of 10. Patient states that not knowing what she wants to do as a profession in the future, not having any goals are the stressors. She adds that currently there are no relieving factors. She states that even though she's not in school, is seeing a therapist regularly she continues to struggle with depression.  Patient reports that she's been self isolating herself, feels tired a lot, is not socializing with her friends, feels hopeless and helpless, has been having on and off suicidal thoughts but no plan, wishing that she was dead, has been struggling with motivation, feeling worthless and guilty for not knowing what she wants to do in regards to her future. She states that her parents are supportive but that she is frustrated with herself and feels like giving up. Patient states that she is willing to contract for safety, does not feel she needs inpatient at this time but needs help. Patient also reports that she's not sleeping at night, continues to worry about everything.  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 She denies any symptoms of mania but does report feeling irritable secondary to her lack of direction in life. Patient denies any symptoms of psychosis, any physical or sexual abuse. Patient also denies any panic attacks  She denies any side effects of the medications. Patient states that she is willing to keep herself safe, mom was present at this visit stated that she would monitor patient closely and does not feel that patient needs to be hospitalized at this time.  Active Ambulatory Problems    Diagnosis Date Noted  . Anxiety 08/29/2011  . MDD (major depressive disorder) 08/29/2011  . ADD (attention deficit disorder) without hyperactivity 05/15/2016  . Tendinopathy of left gluteus medius 07/24/2016  . Trapezius muscle spasm 07/24/2016   Resolved Ambulatory Problems    Diagnosis Date Noted  . No Resolved Ambulatory Problems   Past Medical History:  Diagnosis Date  . Allergy   . Anxiety   . Asthma   . Depression   . Seasonal allergies    Family History  Problem Relation Age of Onset  . Adopted: Yes  . Family history unknown: Yes   patient is adopted.  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 She [place]   Current Outpatient Prescriptions:  .  albuterol (PROVENTIL HFA) 108 (90 BASE) MCG/ACT inhaler, Inhale 2 puffs into the lungs daily as needed for wheezing or shortness of breath., Disp: , Rfl:  .  dexmethylphenidate (FOCALIN XR) 10 MG 24 hr capsule, Take 1 capsule (10 mg total) by mouth daily., Disp: 90 capsule, Rfl: 0 .  FLUoxetine (PROZAC) 20 MG capsule, Take 3 capsules (60 mg total) by mouth daily., Disp: 90 capsule, Rfl: 0 .  hydrOXYzine (VISTARIL) 100 MG capsule, Take 1 capsule (100 mg total) by mouth at bedtime., Disp: 30 capsule, Rfl: 1 .   meloxicam (MOBIC) 7.5 MG tablet, Take daily for 2 weeks, then as needed. Take with food, Disp: 30 tablet, Rfl: 2 .  Norethindrone Acetate-Ethinyl Estrad-FE (LOESTRIN 24 FE) 1-20 MG-MCG(24) tablet, Take 1 tablet by mouth daily., Disp: , Rfl:    Review of Systems  Constitutional: Positive for malaise/fatigue. Negative for fever and weight loss.  HENT: Negative.  Negative for congestion, hearing loss and sore throat.   Eyes: Negative.  Negative for blurred vision, double vision, photophobia, discharge and redness.  Respiratory: Negative.  Negative for shortness of breath and wheezing.   Cardiovascular: Negative.  Negative for chest pain and palpitations.  Gastrointestinal: Negative.  Negative for abdominal pain, blood in stool, constipation, diarrhea, heartburn, melena, nausea and vomiting.  Genitourinary: Negative.  Negative for dysuria.  Musculoskeletal: Negative.  Negative for falls, joint pain, myalgias and neck pain.  Skin: Negative.  Negative for rash.  Neurological: Negative.  Negative for dizziness, tingling, tremors, focal weakness, seizures, loss of consciousness, weakness and headaches.       Had head injury with concussion in the first week of november  Endo/Heme/Allergies: Negative.  Negative for environmental allergies.  Psychiatric/Behavioral: Positive for depression and suicidal ideas. Negative for hallucinations, memory loss and substance abuse. The patient is nervous/anxious and has insomnia.     General Appearance: alert, oriented, no acute distress and well nourished Blood pressure 110/68, pulse 64, height 4' 11.75" (1.518 m), weight 104 lb (47.2 kg).  Musculoskeletal: Strength & Muscle Tone: within normal limits Gait & Station: normal Patient leans: N/A Mental Status Examination  Appearance: Casually dressed, tearful at times Alert: Yes Attention: fair  Cooperative: Yes Eye Contact: Fair Speech: Normal in volume, rate, tone, spontaneous  Psychomotor Activity:  Normal Memory/Concentration: OK Oriented: person, place and situation Mood: Anxious, Depressed, Dysphoric, Hopeless, Irritable and Worthless Affect: Congruent and Tearful Thought Processes and Associations: Coherent, Goal Directed and Descriptions of Associations: Intact Fund of Knowledge: Fair Thought Content: Suicidal ideation with no plan, on and off. Patient denies any homicidal ideation, delusions, paranoia Insight: Fair to poor Judgement: Poor  Language: Fair  Diagnosis: Major depressive disorder, recurrent, severe with no psychotic features, generalized anxiety disorder, ADHD, inattentive subtype  Treatment Plan:  Continue Prozac 60 mg, take three 20 mg tablets in the morning for anxiety and depression.  Discontinue Vistaril and to start trazodone 50 mg as needed at bedtime, may repeat once more. The medications to help with insomnia. The risks and benefits along with the side effects were discussed with patient and she was agreeable with this plan Continue Focalin XR 10 mg 1 in the morning for ADD inattentive type Continue birth control as prescribed by her PCP Patient to start partial hospitalization program to help improve her coping mechanisms, help her adjust her medications to help manage her depression and prevent hospitalization. Call when necessary Patient to follow-up in the PHP program 50% of this appointment was spent in discussing patient's depression, evaluating  safety as this was an urgent appointment due to patient decompensating, discussed sleep hygiene, medications to help improve patient's sleep, [facility] help adjust patient's medications to help with her depression and anxiety. Crisis and safety plan was discussed in length with patient and mom at this visit this was a 40 minute appointment. Patient was also seen by counselor at this visit to help [facility]. This visit was of severe medical  complexity due to the patient's presentation, need for assessment of safety, the need to have a safety plan in which mother was involved. Also discussed calling 911, coming to the ER or behavioral health Hospital if patient feels unsafe at any time. Discussed the need to lock up all sharps, medications and for patient to be monitored closely by parents. Nelly RoutKUMAR,Jamiah Recore, MD

## 2016-08-05 NOTE — Patient Instructions (Signed)
Crisis and safety plan discussed in length with patient and mom Discussed patient starting partial program to prevent hospitalization

## 2016-08-08 DIAGNOSIS — F419 Anxiety disorder, unspecified: Secondary | ICD-10-CM | POA: Diagnosis not present

## 2016-08-08 DIAGNOSIS — F329 Major depressive disorder, single episode, unspecified: Secondary | ICD-10-CM | POA: Diagnosis not present

## 2016-08-11 ENCOUNTER — Other Ambulatory Visit (HOSPITAL_COMMUNITY): Payer: 59 | Attending: Psychiatry | Admitting: Licensed Clinical Social Worker

## 2016-08-11 ENCOUNTER — Telehealth (HOSPITAL_COMMUNITY): Payer: Self-pay

## 2016-08-11 ENCOUNTER — Other Ambulatory Visit (HOSPITAL_COMMUNITY): Payer: 59 | Admitting: Licensed Clinical Social Worker

## 2016-08-11 DIAGNOSIS — F411 Generalized anxiety disorder: Secondary | ICD-10-CM | POA: Diagnosis not present

## 2016-08-11 DIAGNOSIS — R45851 Suicidal ideations: Secondary | ICD-10-CM | POA: Diagnosis not present

## 2016-08-11 DIAGNOSIS — F900 Attention-deficit hyperactivity disorder, predominantly inattentive type: Secondary | ICD-10-CM | POA: Insufficient documentation

## 2016-08-11 DIAGNOSIS — F322 Major depressive disorder, single episode, severe without psychotic features: Secondary | ICD-10-CM

## 2016-08-11 NOTE — Telephone Encounter (Signed)
Patients father called, he was planning a trip to [place] but patient is going to be doing PHP, her father is cancelling the trip, but needs a letter to get his money back. Would it be okay to write the letter? Please review and advise, thank you

## 2016-08-12 ENCOUNTER — Other Ambulatory Visit (HOSPITAL_COMMUNITY): Payer: 59 | Admitting: Licensed Clinical Social Worker

## 2016-08-12 ENCOUNTER — Other Ambulatory Visit (HOSPITAL_COMMUNITY): Payer: 59 | Admitting: Specialist

## 2016-08-12 DIAGNOSIS — F900 Attention-deficit hyperactivity disorder, predominantly inattentive type: Secondary | ICD-10-CM

## 2016-08-12 DIAGNOSIS — F322 Major depressive disorder, single episode, severe without psychotic features: Secondary | ICD-10-CM

## 2016-08-12 DIAGNOSIS — R45851 Suicidal ideations: Secondary | ICD-10-CM | POA: Diagnosis not present

## 2016-08-12 DIAGNOSIS — F411 Generalized anxiety disorder: Secondary | ICD-10-CM

## 2016-08-12 NOTE — Psych (Signed)
   [facility] THERAPIST PROGRESS NOTE  Rebekah Tate [id number]  Session Time: 9 AM - 2 PM  Participation Level: Active  Behavioral Response: CasualAlertDepressed  Type of Therapy: Group Therapy  Treatment Goals addressed: Coping  Interventions: CBT, DBT, Supportive and Reframing  Summary: Clinician facilitated check-in regarding current stressors and situation, and review of patient completed diary card. Clinician utilized active listening and empathetic response and validated patient emotions. Clinician facilitated check-in regarding current stressors and situation, and review of patient completed diary card. Clinician utilized active listening and empathetic response and validated patient emotions. Clinician facilitated discussion on hobbies, relationship stress, and coping skills. Clinician introduced topic of healthy relationships. Clinician led activity to determine ideal traits in a relationship and to differentiate healthy/unhealthy relationship characteristics. Clinician assessed for immediate needs, medication compliance and efficacy, and safety concerns.    Suicidal/Homicidal: Nowithout intent/plan  Therapist Response: Rebekah EstelleKathryn M Dalpe is a 20 y.o. female who presents with depression symptoms. Patient arrived within time allowed and reports she is feeling "okay." Patient rates her mood at a 4 on a 1- 10 scale with 10 being great. Patient engaged in activity and discussion. Patient identified what she wants in an ideal relationship and was able to accurately recognize unhealthy relationship traits. Patient demonstrates some progress as evidenced by particvipation and openness with group on her first day of group. Patient denies SI/HI/self-harm thoughts   Plan: Patient will continue in PHP and medication management. Work towards decreasing depression symptoms and increase emotional regulation and positive coping skills.    Diagnosis: Severe major depression without psychotic  features (HCC) [F32.2]    1. Severe major depression without psychotic features (HCC)   2. GAD (generalized anxiety disorder)   3. Attention deficit hyperactivity disorder (ADHD), predominantly inattentive type       Donia GuilesJenny Adriell Polansky, LCSW 08/13/2016

## 2016-08-12 NOTE — Psych (Signed)
Comprehensive Clinical Assessment (CCA) Note  08/12/2016 Rebekah Tate [id number]  Visit Diagnosis:      ICD-9-CM ICD-10-CM   1. Severe major depression without psychotic features (HCC) 296.23 F32.2   2. GAD (generalized anxiety disorder) 300.02 F41.1   3. Attention deficit hyperactivity disorder (ADHD), predominantly inattentive type 314.00 F90.0       CCA Part One  Part One has been completed on paper by the patient.  (See scanned document in Chart Review)  CCA Part Two A  Intake/Chief Complaint:  CCA Intake With Chief Complaint CCA Part Two Date: 08/11/16 CCA Part Two Time: 0930 Chief Complaint/Presenting Problem: Pt presents as referred by her psychiatrist for PHP due to increase of depression and anxiety symptoms paried with increased thoughts of death and not wanting to live. Pt denies active SI/HI, plan and intent. Pt presents with monotone voice and flat affect.  Patients Currently Reported Symptoms/Problems: Pt reports depressed mood, increased anhedonia, low motivation, emotional numbness, critical self talk, self harm behaviors, constant worry, preoccupation with weight/calorie intake, increased ambivalence about life and thoughts about death. Pt denies plan or intent for SI/HI.  Pt reports not sleeping well, approximately 3 hours a night.  Individual's Strengths: Pt is agreeable to treatment, reports strong supports in her parents, and a safe hom environment.   Mental Health Symptoms Depression:  Depression: Change in energy/activity, Fatigue, Hopelessness, Sleep (too much or little), Worthlessness  Mania:     Anxiety:   Anxiety: Restlessness, Sleep, Tension, Worrying, Fatigue  Psychosis:     Trauma:     Obsessions:     Compulsions:     Inattention:     Hyperactivity/Impulsivity:     Oppositional/Defiant Behaviors:     Borderline Personality:     Other Mood/Personality Symptoms:      Mental Status Exam Appearance and self-care  Stature:  Stature: Average   Weight:  Weight: Thin  Clothing:  Clothing: Casual  Grooming:  Grooming: Normal  Cosmetic use:  Cosmetic Use: Age appropriate  Posture/gait:  Posture/Gait: Slumped  Motor activity:  Motor Activity: Not Remarkable  Sensorium  Attention:  Attention: Normal  Concentration:  Concentration: Normal  Orientation:  Orientation: X5  Recall/memory:  Recall/Memory: Normal  Affect and Mood  Affect:  Affect: Flat, Depressed  Mood:  Mood: Depressed  Relating  Eye contact:  Eye Contact: Normal  Facial expression:  Facial Expression: Depressed  Attitude toward examiner:  Attitude Toward Examiner: Cooperative  Thought and Language  Speech flow: Speech Flow: Normal  Thought content:  Thought Content: Appropriate to mood and circumstances  Preoccupation:     Hallucinations:     Organization:     Company secretaryxecutive Functions  Fund of Knowledge:  Fund of Knowledge: Average  Intelligence:  Intelligence: [employer]  Abstraction:  Abstraction: Normal  Judgement:  Judgement: Fair  Dance movement psychotherapisteality Testing:  Reality Testing: Adequate  Insight:  Insight: Fair  Decision Making:  Decision Making: Normal  Social Functioning  Social Maturity:     Social Judgement:     Stress  Stressors:  Stressors: Transitions  Coping Ability:  Coping Ability: Normal  Skill Deficits:     Supports:      Family and Psychosocial History: Family history Marital status: Single  Childhood History:  Childhood History By whom was/is the patient raised?: Adoptive parents (Pt reports she was raised by both adoptive parents who she has been with since age 33. ) Patient's description of current relationship with people who raised him/her: Pt reports a positive relationship  with her parents and that they are supportive.  Does patient have siblings?: No Did patient suffer any verbal/emotional/physical/sexual abuse as a child?: No Did patient suffer from severe childhood neglect?: No Has patient ever been sexually abused/assaulted/raped as  an adolescent or adult?: No Was the patient ever a victim of a crime or a disaster?: No Witnessed domestic violence?: No Has patient been effected by domestic violence as an adult?: No  CCA Part Two B  Employment/Work Situation: Employment / Work Psychologist, occupationalituation Employment situation: Employed Where is patient currently employed?: [employer]; [employer] Patient's job has been impacted by current illness: Yes Describe how patient's job has been impacted: Pt has taken a break from [employer] to accomodate treatment Has patient ever been in the Eli Lilly and Companymilitary?: No Are There Guns or Other Weapons in Your Home?: No  Education: Education Last Grade Completed: 14 Did Garment/textile technologistYou Graduate From [employer]?: Yes Did Theme park managerYou Attend College?: Yes (Pt reports she attended [employer] for 2 years and is taking a gap year and plans to transfer)  Religion:    Leisure/Recreation: Leisure / Recreation Leisure and Hobbies: 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 ) Have You Gained or Lost A Significant Amount of Weight in the Past Six Months?: No Do You Follow a Special Diet?:  (Pt reports she is limited in what she eats because she worries about calories.  Pt reports some restrictive intake) Do You Have Any Trouble Sleeping?: Yes Explanation of Sleeping Difficulties: Pt states "normal" is 3-5 hours for her. Pt recently began a sleep medication.  CCA Part Two C  Alcohol/Drug Use: Alcohol / Drug Use Pain Medications: Pt denies Prescriptions: Focalin; Trazodone; Prozac; Mobic; Albuterol Over the Counter: Pt denies History of alcohol / drug use?: No history of alcohol / drug abuse        CCA Part Three  ASAM's:  Six Dimensions of Multidimensional Assessment  Dimension 1:  Acute  Intoxication and/or Withdrawal Potential:     Dimension 2:  Biomedical Conditions and Complications:     Dimension 3:  Emotional, Behavioral, or Cognitive Conditions and Complications:     Dimension 4:  Readiness to Change:     Dimension 5:  Relapse, Continued use, or Continued Problem Potential:     Dimension 6:  Recovery/Living Environment:      Substance use Disorder (SUD)    Social Function:     Stress:  Stress Stressors: Transitions Coping Ability: Normal Patient Takes Medications The Way The Doctor Instructed?: Yes Priority Risk: Moderate Risk  Risk Assessment- Self-Harm Potential: Risk Assessment For Self-Harm Potential Thoughts of Self-Harm: No current thoughts Method: No plan Additional Information for Self-Harm Potential: Acts of Self-harm Additional Comments for Self-Harm Potential: Pt reports thinking about death and an ambivalence towards life, however does not indicate intention for action  Risk Assessment -Dangerous to Others Potential: Risk Assessment For Dangerous to Others Potential Method: No Plan  DSM5 Diagnoses: Patient Active Problem List   Diagnosis Date Noted  . Tendinopathy of left gluteus medius 07/24/2016  . Trapezius muscle spasm 07/24/2016  . ADD (attention deficit disorder) without hyperactivity 05/15/2016  . Anxiety 08/29/2011  . Severe recurrent major depression without psychotic features (HCC) 08/29/2011    Patient Centered Plan: Patient is on the following Treatment Plan(s):  Depression  Recommendations for Services/Supports/Treatments: Recommendations for Services/Supports/Treatments Recommendations For Services/Supports/Treatments: Partial Hospitalization (Pt will benefit from PHP due to sudden increase in depression symptoms and thoughts of death. )  Treatment Plan Summary: OP Treatment Plan Summary: Pt states: "To want to do things again. To have motivation."  Referrals to Alternative Service(s): Referred to Alternative  Service(s):   Place:   Date:   Time:    Referred to Alternative Service(s):   Place:   Date:   Time:    Referred to Alternative Service(s):   Place:   Date:   Time:    Referred to Alternative Service(s):   Place:   Date:   Time:     [name], MSW, LCSW, LCAS

## 2016-08-12 NOTE — Therapy (Addendum)
[facility] [address] [place], [place], [address] Phone: [phone]   Fax:  [phone]  Occupational Therapy Evaluation  Patient Details  Name: Rebekah Tate MRN: [id number] Date of Birth: February 24, 1996 No Data Recorded  Encounter Date: 08/12/2016      OT End of Session - 08/12/16 2221    Visit Number 1   Number of Visits 6   Date for OT Re-Evaluation 09/11/16   Authorization Type UMR   OT Start Time 1030   OT Stop Time 1145   OT Time Calculation (min) 75 min      Past Medical History:  Diagnosis Date  . Allergy   . Anxiety   . Asthma   . Depression   . Seasonal allergies     No past surgical history on file.  There were no vitals filed for this visit.      Subjective Assessment - 08/12/16 2221    Currently in Pain? No/denies        OT assessment Diagnosis major depressive disorder Past medical history adhd  Living situation with family ADLs independent Work [employer] Leisure n/a Social support no Struggles feeling calm OT goal learn coping strategies. General Causality Orientation Scale   Subscore Percentile Score  Autonomy 62 77.62  Control 29 2.97  Impersonal 63 93   Motivation Type  Motivation type Explanation  o Autonomy-oriented The individual is clear about what he or she is doing.  There is clear connection between behavior and interest/personal goals.  Motivation is intact.  Assessment:  Patient demonstrates autonomy motivation type.  Patient will benefit from occupational therapy intervention in order to improve time management, financial management, stress management, job readiness skills, social skills,sleep hygiene, exercise and healthy eating habits,  and health management skills and other psychosocial skills needed for preparation to return to full time community living and to be a productive community member.   Plan:  Patient will participate in skilled  occupational therapy sessions individually or in a group setting to improve coping skills, psychosocial skills, and emotional skills required to return to prior level of function as a productive community member. Treatment will be 1-2 times per week for 2-6 weeks.     S:  I stress about life in general O:  Patient actively participated in the following skilled occupational therapy group this date:  Stress management Patient required facilitation from occupational therapist (min) to remain focused and engaged in group. A:  Patient participated in skilled occupational therapy group for stress management skills this date.  Patient was minimally engaged. P:  Continue participation in skilled occupational therapy groups  1-2 times per week for 3 weeks in order to gain the necessary skills needed to return to full time community living and learn effective coping strategies to be a productive community resident.                       OT Education - 08/12/16 2221    Education provided Yes   Education Details stress management skills   Person(s) Educated Patient   Methods Explanation;Handout   Comprehension Verbalized understanding          OT Short Term Goals - 08/12/16 2223      OT SHORT TERM GOAL #1   Title Patient will be educated on strategies to improve psychosocial skills needed to participate fully in all daily, work, and leisure activities.   Time 3  Period Weeks   Status New     OT SHORT TERM GOAL #2   Title Patient will be educated on a HEP and independent with implementation of HEP.3   Time 3   Period Weeks   Status New     OT SHORT TERM GOAL #3   Title Patient will independently apply psychosocial skills and coping mechanisms to her daily actiivties in order to function independently   Time 3   Period Weeks   Status New                  Plan - 08/12/16 2222    Rehab Potential Good   OT Frequency 2x / week   OT Duration 4 weeks   OT  Treatment/Interventions Self-care/ADL training      Patient will benefit from skilled therapeutic intervention in order to improve the following deficits and impairments:   (decreased coping strategies, decreased pyschosocial skills)  Visit Diagnosis: Severe major depression without psychotic features (HCC)  GAD (generalized anxiety disorder)  Attention deficit hyperactivity disorder (ADHD), predominantly inattentive type    Problem List Patient Active Problem List   Diagnosis Date Noted  . Tendinopathy of left gluteus medius 07/24/2016  . Trapezius muscle spasm 07/24/2016  . ADD (attention deficit disorder) without hyperactivity 05/15/2016  . Anxiety 08/29/2011  . Severe recurrent major depression without psychotic features [facility](HCC) 08/29/2011    Shirlean MylarBethany H. Samyah Bilbo, MHA, OTR/L [phone]  08/12/2016, 10:25 PM  [facility] [address] [place], [place], [address] Phone: [phone]   Fax:  [phone]  Name: Rebekah Tate MRN: [id number] Date of Birth: June 02, 1996

## 2016-08-13 ENCOUNTER — Other Ambulatory Visit (HOSPITAL_COMMUNITY): Payer: 59 | Admitting: Licensed Clinical Social Worker

## 2016-08-13 DIAGNOSIS — F322 Major depressive disorder, single episode, severe without psychotic features: Secondary | ICD-10-CM

## 2016-08-13 DIAGNOSIS — F900 Attention-deficit hyperactivity disorder, predominantly inattentive type: Secondary | ICD-10-CM | POA: Diagnosis not present

## 2016-08-13 DIAGNOSIS — F411 Generalized anxiety disorder: Secondary | ICD-10-CM

## 2016-08-13 DIAGNOSIS — R45851 Suicidal ideations: Secondary | ICD-10-CM | POA: Diagnosis not present

## 2016-08-13 NOTE — Psych (Signed)
   [facility]  Rebekah EstelleKathryn M Leard [id number]  Session Time: 9 AM - 2 PM  Participation Level: Active  Behavioral Response: CasualAlertDepressed  Type of Therapy: Group Therapy  Treatment Goals addressed: Coping  Interventions: CBT, DBT, Supportive and Reframing  Summary: Clinician facilitated check-in regarding current stressors and situation, and review of patient completed diary card. Clinician utilized active listening and empathetic response and validated patient emotions. Clinician facilitated discussion on anxiety and how to manage it as well as relationship stress. Clinician introduced topic of the holidays and particular stresses that may arise and how to deal with them. Clinician assessed for immediate needs, medication compliance and efficacy, and safety concerns.    Suicidal/Homicidal: Nowithout intent/plan  Therapist Response: Rebekah EstelleKathryn M Mandelbaum is a 20 y.o. female who presents with depression symptoms. Patient arrived within time allowed and reports she is feeling "okay." Patient rates her mood at a 6 on a 1- 10 scale with 10 being great. Patient engaged in activity and discussion. Patient was able to identify issues which may be problematic and how to address them. Patient shared continued critical language regarding food and eating as well as judgments for eating. Patient demonstrates some progress as evidenced by continued active participation and openness in group. Patient is eager to offer support to other group members. Patient denies SI/HI/self-harm thoughts   Plan: Patient will continue in PHP and medication management. Work towards decreasing depression symptoms and increase emotional regulation and positive coping skills.    Diagnosis: Severe major depression without psychotic features (HCC) [F32.2]    1. Severe major depression without psychotic features (HCC)   2. GAD (generalized anxiety disorder)   3. Attention deficit hyperactivity  disorder (ADHD), predominantly inattentive type       Donia GuilesJenny Beacher Every, LCSW 08/13/2016

## 2016-08-13 NOTE — Psych (Signed)
   [facility]  Rebekah EstelleKathryn M Brands [id number]  Session Time: 9 AM - 2 PM  Participation Level: Active  Behavioral Response: CasualAlertDepressed  Type of Therapy: Group Therapy  Treatment Goals addressed: Coping  Interventions: CBT, DBT, Supportive and Reframing  Summary: Clinician facilitated check-in regarding current stressors and situation, and review of patient completed diary card. Clinician utilized active listening and empathetic response and validated patient emotions. Clinician facilitated discussion on motivation and goal setting. Clinician introduced topic positive psychology and ways to "re-train" your brain for positivity. Clinician led gratitude activity and group participated in coping skill bingo.  Clinician assessed for immediate needs, medication compliance and efficacy, and safety concerns.    Suicidal/Homicidal: Nowithout intent/plan  Therapist Response: Rebekah Tate is a 20 y.o. female who presents with depression symptoms. Patient arrived within time allowed and reports she is feeling "okay." Patient rates her mood at a 4 on a 1- 10 scale with 10 being great due to "eating too much" last night. Patient reported some self harming thoughts and states she did not act because "it's Thanksgiving and I didn't want to do anything stupid."  Patient engaged in activity and discussion.  Patient was able to share one baby step to take towards a goal. Patient  reports understanding of topic and named gratitudes and coping skills to utilize on holiday break.  Patient demonstrates some progress as evidenced by continued active participation. Patient is shut down when it comes to problem solving or re-thinking harmful thought patterns regarding eating which is a common theme in her discussion.  Patient denies SI/HI/self-harm thoughts.    Plan: Patient will continue in PHP and medication management. Work towards decreasing depression symptoms and increase  emotional regulation and positive coping skills.    Diagnosis: Severe major depression without psychotic features (HCC) [F32.2]    1. Severe major depression without psychotic features (HCC)   2. GAD (generalized anxiety disorder)   3. Attention deficit hyperactivity disorder (ADHD), predominantly inattentive type       Rebekah GuilesJenny Herve Haug, LCSW 08/13/2016

## 2016-08-14 ENCOUNTER — Ambulatory Visit (HOSPITAL_COMMUNITY): Payer: Self-pay

## 2016-08-18 ENCOUNTER — Other Ambulatory Visit (HOSPITAL_COMMUNITY): Payer: 59 | Admitting: Licensed Clinical Social Worker

## 2016-08-18 DIAGNOSIS — F322 Major depressive disorder, single episode, severe without psychotic features: Secondary | ICD-10-CM | POA: Diagnosis not present

## 2016-08-18 DIAGNOSIS — F411 Generalized anxiety disorder: Secondary | ICD-10-CM

## 2016-08-18 DIAGNOSIS — R45851 Suicidal ideations: Secondary | ICD-10-CM | POA: Diagnosis not present

## 2016-08-18 DIAGNOSIS — F900 Attention-deficit hyperactivity disorder, predominantly inattentive type: Secondary | ICD-10-CM

## 2016-08-19 ENCOUNTER — Other Ambulatory Visit (HOSPITAL_COMMUNITY): Payer: 59 | Admitting: Occupational Therapy

## 2016-08-19 ENCOUNTER — Encounter (HOSPITAL_COMMUNITY): Payer: Self-pay | Admitting: Occupational Therapy

## 2016-08-19 ENCOUNTER — Other Ambulatory Visit (HOSPITAL_COMMUNITY): Payer: 59 | Admitting: Licensed Clinical Social Worker

## 2016-08-19 DIAGNOSIS — F900 Attention-deficit hyperactivity disorder, predominantly inattentive type: Secondary | ICD-10-CM | POA: Diagnosis not present

## 2016-08-19 DIAGNOSIS — F411 Generalized anxiety disorder: Secondary | ICD-10-CM

## 2016-08-19 DIAGNOSIS — R45851 Suicidal ideations: Secondary | ICD-10-CM | POA: Diagnosis not present

## 2016-08-19 DIAGNOSIS — F322 Major depressive disorder, single episode, severe without psychotic features: Secondary | ICD-10-CM

## 2016-08-19 NOTE — Psych (Signed)
   [facility]  Rebekah EstelleKathryn M Tate [id number]  Session Time: 9 AM - 2 PM  Participation Level: Active  Behavioral Response: CasualAlertDepressed  Type of Therapy: Group Therapy  Treatment Goals addressed: Coping  Interventions: CBT, DBT, Supportive and Reframing  Summary: Clinician facilitated check-in regarding current stressors and situation, and review of patient completed diary card. Clinician utilized active listening and empathetic response and validated patient emotions. Clinician facilitated discussion on the holiday break and how it went. Group discussed ways to increase sociability and positive activities into their every day. Clinician introduced topic of friendships and self esteem. Clinician assessed for immediate needs, medication compliance and efficacy, and safety concerns.    Suicidal/Homicidal: Nowithout intent/plan  Therapist Response: Rebekah Tate is a 20 y.o. female who presents with depression symptoms. Patient arrived within time allowed and reports she is feeling "good." Patient rates her mood at a 5 on a 1- 10 scale with 10 being great.  Patient engaged in activity and discussion.  Patient shared experiences from the break and how she managed. Patient brainstormed ways to reach out and join new social groups. Patient verbalized ways in which her self esteem effected her relationships. Patient demonstrates some progress as evidenced by offering positive feedback to other group members and demonstrating some insight into her problems. Patient reports trying to hang out with friends outside of group. Patient denies SI/HI/self-harm thoughts.    Plan: Patient will continue in PHP and medication management. Work towards decreasing depression symptoms and increase emotional regulation and positive coping skills.    Diagnosis: Severe major depression without psychotic features (HCC) [F32.2]    1. Severe major depression without psychotic features  (HCC)   2. GAD (generalized anxiety disorder)   3. Attention deficit hyperactivity disorder (ADHD), predominantly inattentive type       Donia GuilesJenny Avelyn Touch, LCSW 08/19/2016

## 2016-08-19 NOTE — Therapy (Signed)
[facility] [address] [place], [place], [address] Phone: [phone]   Fax:  [phone]  Occupational Therapy Treatment  Patient Details  Name: Rebekah Tate MRN: [id number] Date of Birth: 06-07-1996 No Data Recorded  Encounter Date: 08/19/2016      OT End of Session - 08/19/16 1238    Visit Number 2   Number of Visits 6   Date for OT Re-Evaluation 09/11/16   Authorization Type UMR   OT Start Time 1030   OT Stop Time 1130   OT Time Calculation (min) 60 min   Activity Tolerance Patient tolerated treatment well   Behavior During Therapy [facility] for tasks assessed/performed      Past Medical History:  Diagnosis Date  . Allergy   . Anxiety   . Asthma   . Depression   . Seasonal allergies     No past surgical history on file.  There were no vitals filed for this visit.      Subjective Assessment - 08/19/16 1238    Currently in Pain? No/denies         OT Treatment Group   S:  "I do better with less sleep."  O:  Patient actively participated in the following skilled occupational therapy treatment session this date: Sleep hygiene - discussed ideal amount of sleep one needs, Rebekah Tate's current sleep schedule and ideal schedule. Rebekah Tate currently get approximately 5 hours of sleep per night and is pleased with this number. Discussed reasons current sleep schedule is not ideal for her social and school needs and strategies that may be utilized to decrease the amount of time he sleeps and increase her activity and accountability level.  Discussed developing a routine for leading up to going to bed to promote falling asleep quickly and remaining asleep.  Patient remained engaged and focused during session. Rebekah Tate participated in group discussion of factors that interfere with sleep and lifestyle factors promoting sleep. Also discussed moving to a different environment if trying to remain awake.  A:  Patient participated  in skilled occupational therapy group for sleep hygiene skills this date.  Patient was engaged and appears open to strategies introduced.  Patient is getting a varied amount of sleep each night, including daily naps ranging in length and time of day.  Discussed strategies to make environment less conducive to sleep during daylight hours, managing diet, and developing an exercise schedule.  P:  Continue participation in skilled occupational therapy groups  1-2 times per week in order to gain the necessary skills needed to return to full time community living and learn effective coping strategies to be a productive community resident. Follow up on HEP for developing a evening/night-time routine and finding a daily exercise outlet of moderate intensity.            OT Short Term Goals - 08/19/16 1239      OT SHORT TERM GOAL #1   Title Patient will be educated on strategies to improve psychosocial skills needed to participate fully in all daily, work, and leisure activities.   Time 3   Period Weeks   Status On-going     OT SHORT TERM GOAL #2   Title Patient will be educated on a HEP and independent with implementation of HEP.3   Time 3   Period Weeks   Status On-going     OT SHORT TERM GOAL #3   Title Patient will independently apply psychosocial skills and coping mechanisms to her  daily activties in order to function independently   Time 3   Period Weeks   Status On-going                  Plan - 08/19/16 1238    Rehab Potential Good   OT Frequency --  1-2x/week   OT Duration --  3 weeks   OT Treatment/Interventions Self-care/ADL training  coping skills mechanism training, psychosocial skill development, community reintegration   Consulted and Agree with Plan of Care Patient      Patient will benefit from skilled therapeutic intervention in order to improve the following deficits and impairments:   (decreased coping skills, decreased psychosocial skills)  Visit  Diagnosis: Severe major depression without psychotic features (HCC)  GAD (generalized anxiety disorder)    Problem List Patient Active Problem List   Diagnosis Date Noted  . Tendinopathy of left gluteus medius 07/24/2016  . Trapezius muscle spasm 07/24/2016  . ADD (attention deficit disorder) without hyperactivity 05/15/2016  . Anxiety 08/29/2011  . Severe recurrent major depression without psychotic features [facility](HCC) 08/29/2011    Ezra SitesLeslie Deaisa Merida, OTR/L  [phone] 08/19/2016, 12:40 PM  [facility] [address] [place], [place], [address] Phone: [phone]   Fax:  [phone]  Name: Rebekah Tate MRN: [id number] Date of Birth: [birth year]

## 2016-08-20 ENCOUNTER — Inpatient Hospital Stay (HOSPITAL_COMMUNITY)
Admission: RE | Admit: 2016-08-20 | Discharge: 2016-08-24 | DRG: 885 | Disposition: A | Discharge status: Home / Self Care | Payer: 59 | Attending: Psychiatry | Admitting: Psychiatry

## 2016-08-20 ENCOUNTER — Other Ambulatory Visit (HOSPITAL_COMMUNITY): Payer: 59 | Admitting: Licensed Clinical Social Worker

## 2016-08-20 ENCOUNTER — Encounter (HOSPITAL_COMMUNITY): Payer: Self-pay

## 2016-08-20 DIAGNOSIS — F332 Major depressive disorder, recurrent severe without psychotic features: Principal | ICD-10-CM | POA: Diagnosis present

## 2016-08-20 DIAGNOSIS — F419 Anxiety disorder, unspecified: Secondary | ICD-10-CM | POA: Diagnosis not present

## 2016-08-20 DIAGNOSIS — Z23 Encounter for immunization: Secondary | ICD-10-CM

## 2016-08-20 DIAGNOSIS — Z91048 Other nonmedicinal substance allergy status: Secondary | ICD-10-CM | POA: Diagnosis not present

## 2016-08-20 DIAGNOSIS — R630 Anorexia: Secondary | ICD-10-CM | POA: Diagnosis present

## 2016-08-20 DIAGNOSIS — F411 Generalized anxiety disorder: Secondary | ICD-10-CM | POA: Diagnosis not present

## 2016-08-20 DIAGNOSIS — F988 Other specified behavioral and emotional disorders with onset usually occurring in childhood and adolescence: Secondary | ICD-10-CM | POA: Diagnosis present

## 2016-08-20 DIAGNOSIS — Z79899 Other long term (current) drug therapy: Secondary | ICD-10-CM

## 2016-08-20 DIAGNOSIS — F322 Major depressive disorder, single episode, severe without psychotic features: Secondary | ICD-10-CM | POA: Diagnosis not present

## 2016-08-20 DIAGNOSIS — R45851 Suicidal ideations: Secondary | ICD-10-CM | POA: Diagnosis present

## 2016-08-20 DIAGNOSIS — Z91018 Allergy to other foods: Secondary | ICD-10-CM | POA: Diagnosis not present

## 2016-08-20 DIAGNOSIS — F900 Attention-deficit hyperactivity disorder, predominantly inattentive type: Secondary | ICD-10-CM | POA: Diagnosis not present

## 2016-08-20 MED ORDER — FLUOXETINE HCL 10 MG PO CAPS
10.0000 mg | ORAL_CAPSULE | Freq: Every day | ORAL | Status: DC
  Administered 2016-08-21: 10 mg via ORAL
  Filled 2016-08-20 (×4): qty 1

## 2016-08-20 MED ORDER — ACETAMINOPHEN 325 MG PO TABS: 650.0000 mg | ORAL_TABLET | Freq: Four times a day (QID) | ORAL | Status: DC | PRN

## 2016-08-20 MED ORDER — DEXMETHYLPHENIDATE HCL ER 5 MG PO CP24
10.0000 mg | ORAL_CAPSULE | Freq: Every day | ORAL | Status: DC
  Administered 2016-08-21 – 2016-08-24 (×4): 10 mg via ORAL
  Filled 2016-08-20 (×4): qty 2

## 2016-08-20 MED ORDER — MAGNESIUM HYDROXIDE 400 MG/5ML PO SUSP: 30.0000 mL | Freq: Every day | ORAL | Status: DC | PRN

## 2016-08-20 MED ORDER — TRAZODONE HCL 50 MG PO TABS
50.0000 mg | ORAL_TABLET | Freq: Every evening | ORAL | Status: DC | PRN
  Filled 2016-08-20: qty 1

## 2016-08-20 MED ORDER — HYDROXYZINE HCL 25 MG PO TABS
25.0000 mg | ORAL_TABLET | Freq: Four times a day (QID) | ORAL | Status: DC | PRN
  Filled 2016-08-20: qty 1

## 2016-08-20 MED ORDER — ALUM & MAG HYDROXIDE-SIMETH 200-200-20 MG/5ML PO SUSP: 30.0000 mL | ORAL | Status: DC | PRN

## 2016-08-20 MED ORDER — INFLUENZA VAC SPLIT QUAD 0.5 ML IM SUSY
0.5000 mL | PREFILLED_SYRINGE | INTRAMUSCULAR | Status: AC
  Administered 2016-08-21: 0.5 mL via INTRAMUSCULAR
  Filled 2016-08-20: qty 0.5

## 2016-08-20 MED ORDER — NORETHIN-ETH ESTRADIOL-FE 0.4-35 MG-MCG PO CHEW
1.0000 | CHEWABLE_TABLET | Freq: Every day | ORAL | Status: DC
  Administered 2016-08-23 – 2016-08-24 (×2): 1 via ORAL
  Filled 2016-08-20 (×2): qty 1

## 2016-08-20 NOTE — Psych (Signed)
   [facility]  Lucia EstelleKathryn M Nobbe [id number]  Session Time: 9 AM - 2 PM  Participation Level: Active  Behavioral Response: CasualAlertDepressed  Type of Therapy: Group Therapy  Treatment Goals addressed: Coping  Interventions: CBT, DBT, Supportive and Reframing  Summary: Clinician facilitated check-in regarding current stressors and situation, and review of patient completed diary card. Clinician utilized active listening and empathetic response and validated patient emotions. Clinician facilitated discussion on managing anxiety symptoms and relationships. Clinician led skill review of distress tolerance skills, mindfulness, radical acceptance, and cognitive check skills. Clinician assessed for immediate needs, medication compliance and efficacy, and safety concerns.    Suicidal/Homicidal: Nowithout intent/plan  Therapist Response: Lucia EstelleKathryn M Brennan is a 20 y.o. female who presents with depression symptoms. Patient arrived within time allowed and reports she is feeling "good." Patient rates her mood at a 8 on a 1- 10 scale with 10 being great. Patient states feeling positive and upbeat. Patient engaged in activity and discussion.  Patient participated in skill review and reported understanding of how and when to use skills.  Patient demonstrates some progress as evidenced by a brightened demeanor and sharing social plans she had made for this afternoon. Patient denies SI/HI/self-harm thoughts.    Plan: Patient will continue in PHP and medication management. Work towards decreasing depression symptoms and increase emotional regulation and positive coping skills.    Diagnosis: Severe major depression without psychotic features (HCC) [F32.2]    1. Severe major depression without psychotic features (HCC)   2. GAD (generalized anxiety disorder)       Donia GuilesJenny Larnie Heart, LCSW 08/20/2016

## 2016-08-20 NOTE — Progress Notes (Signed)
Bronson CurbKathyrn is a 20 year old female being admitted voluntarily to [facility] as a walk in.  [facility]  She reported suicidal thoughts with an intent and reported that she has been thinking of multiple plan.  She has been experiencing depression for a number of years and doesn't feel like it has been helping.  SHe is experiencing hopelessness, helplessness, worthless, isolating, no energy and anhedonia.  SHe has a history of cutting but no recent episodes of cutting.  She reproted not feeling safe at home because of the suicidal ideation.  She is diagnosed with Major Depressive Disorder, recurrent, severe w/o psychotic features.  She denies any medical problems and appears to be in no physical distress.  Admission paperwork completed and signed.  Belongings searched and secured in locker # 41.  Skin assessment completed and no skin issues noted.  Q 15 minute checks initiated for safety.  We will monitor the progress towards her goals.

## 2016-08-20 NOTE — Tx Team (Signed)
Initial Treatment Plan 08/20/2016 6:28 PM Rebekah EstelleKathryn M Koerner ZOX:[id number]    PATIENT STRESSORS: Other: Pt denies any stressors   PATIENT STRENGTHS: MetallurgistCommunication skills Financial means General fund of knowledge Physical Health   PATIENT IDENTIFIED PROBLEMS: Depression  Anxiety  Suicidal ideation  "feeling better"  "Figure out meds"             DISCHARGE CRITERIA:  Improved stabilization in mood, thinking, and/or behavior Verbal commitment to aftercare and medication compliance  PRELIMINARY DISCHARGE PLAN: Outpatient therapy Medication management  PATIENT/FAMILY INVOLVEMENT: This treatment plan has been presented to and reviewed with the patient, Rebekah Tate.  The patient and family have been given the opportunity to ask questions and make suggestions.  Levin BaconHeather V Arti Trang, RN 08/20/2016, 6:28 PM

## 2016-08-20 NOTE — Progress Notes (Signed)
Ms Rebekah Tate remains depressed.  She says she is no a believer in therapy, psychiatry or psychology dismissing the disciplines as if they are not legitimate.  She realizes that sounds strange as both her parents are psychologists.  Nothing has ever helped over the years she says.  She has tried medication and different therapies.  She has been anxious all her life and remains so.  Depression was most noticeable since the ninth grade but this bout is the worst.  It started when she went to college at [employer].  She made every effort to be involved in activities and student life and to make friends but nothing brought her pleasure or happiness and she just got worse.  This is a gap year for her and she is still depressed.  On the surface she says her life is wonderful in that she has wonderful parents, no trauma, never wanted for anything,has friends, makes good grades and is talented at sports.  Nevertheless she feels depressed all the time.  She was bullied in elementary school and that was bad.   Recently she has had no appetite.  Food makes her stomach hurt and just the idea of eating repulses her.  She looks in the mirror and sees herself as fat even though she weighs 102 lbs.  Says she will not get down to unhealthy weight but she does need to lose more she believes.  No evidence of purging just limited intake. The group is not particularly helpful she believes, just going through the motions.  Her only points of pleasure are her dog and her parents. Plan:  Leave meds as they are.  Consider formal diagnosis of eating disorder.  Doubt that group will be that helpful as she is dismissive of all that we do but would still give it a chance as she is a capable though depressed person who might benefit from helping others if nothing else.

## 2016-08-20 NOTE — H&P (Signed)
Behavioral Health Medical Screening Exam  Rebekah EstelleKathryn M Tate is an 20 y.o. female who [facility] as a [facility], with her mother. She has severe depression with significant suicidal ideation with a plan.   Total Time spent with patient: 15 minutes  Psychiatric Specialty Exam: Physical Exam  Constitutional: She is oriented to person, place, and time. She appears well-developed and well-nourished.  HENT:  Head: Normocephalic.  Eyes: Pupils are equal, round, and reactive to light.  Neck: Normal range of motion.  Cardiovascular: Normal rate and normal heart sounds.   Respiratory: Effort normal and breath sounds normal.  GI: Soft. Bowel sounds are normal.  Musculoskeletal: Normal range of motion.  Neurological: She is alert and oriented to person, place, and time.  Skin: Skin is warm and dry.    Review of Systems  Psychiatric/Behavioral: Positive for depression (poor appetitie, tearful, flat) and suicidal ideas. Negative for hallucinations, memory loss and substance abuse. The patient has insomnia. The patient is not nervous/anxious (poor sleep).   All other systems reviewed and are negative.   Blood pressure 122/79, pulse 67, temperature 98.9 F (37.2 C), temperature source Oral, resp. rate 18, SpO2 100 %.There is no height or weight on file to calculate BMI.  General Appearance: Casual and Fairly Groomed  Eye Contact:  Fair  Speech:  Clear and Coherent and Slow  Volume:  Decreased  Mood:  Depressed  Affect:  Congruent, Depressed, Flat and Tearful  Thought Process:  Coherent  Orientation:  Full (Time, Place, and Person)  Thought Content:  Logical  Suicidal Thoughts:  Yes.  with intent/plan  Homicidal Thoughts:  No  Memory:  Immediate;   Good Recent;   Fair Remote;   Fair  Judgement:  Fair  Insight:  Good  Psychomotor Activity:  Normal  Concentration: Concentration: Good and Attention Span: Fair  Recall:  Good  Fund of Knowledge:Good  Language: Good  Akathisia:   No  Handed:  Right  AIMS (if indicated):     Assets:  Communication Skills Desire for Improvement Financial Resources/Insurance Housing Physical Health Resilience Social Support Transportation Vocational/Educational  Sleep:       Musculoskeletal: Strength & Muscle Tone: within normal limits Gait & Station: normal Patient leans: N/A  Blood pressure 122/79, pulse 67, temperature 98.9 F (37.2 C), temperature source Oral, resp. rate 18, SpO2 100 %.  Recommendations:  Based on my evaluation the patient does not appear to have an emergency medical condition.  Pt meet criteria for inpatient psychiatric admission. [facility].   Laveda AbbeLaurie Britton Jazsmin Couse, NP 08/20/2016, 5:07 PM

## 2016-08-20 NOTE — BH Assessment (Signed)
Assessment Note  Rebekah Tate is a 20 y.o. female with a history of depressive symptoms and who is currently participating in [facility] program who presented voluntarily to [facility] as a walk-in.  She was accompanied by her mother, who remained during assessment.  Pt complained of increased depressive symptoms and suicidal ideation (with intent).  Pt reported as follows:  She has participated in the [facility] program, but finds that it is not alleviating her depressive symptoms.  Per report, "she is no longer a believer in therapy, psychiatry or psychology ... Nothing has ever helped over the years ... She has been anxious all her life and remains so .Marland Kitchen She feels depressed all the time."  Pt endorsed the following symptoms:  Significant suicidal ideation with intent (regarding plan, Pt stated, "I think about all the ways I could do it all the time"); persistent and unremitting sadness; hopelessness and worthlessness; fatigue; isolation; loss of pleasure in activities apart from spending time with her parents and dog; tearfulness; poor appetite.  In addition, Pt endorsed a history of cutting behavior, although she denied cutting herself recently.  Pt declined to plan for safety, stating that she did not feel safe going home because of suicidal ideation.  Per report, Pt has experienced depressive symptoms since 9th grade.  Her most recent bout of depression started when she began her college career at [employer] (she is currently taking a gap year and does not plan on returning to that school).  Per Partial Hospitalization note:  "On the safe, she says her life is wonderful in that she has wonderful parents, no trauma, never wanted for anything, makes good grades and is talented at sports.  Nevertheless, she feels depressed all the time."  Also per report, Pt was bullied in elementary school, and she currently struggles with body image.  "She looks in the mirror and  sees herself as fat even though she weights 102 lbs."  Per mother, Pt recently started taking Trazodone.    During assessment, Pt was calm and cooperative.  She had good eye contact, was dressed in street clothes, and appeared appropriately groomed.  Pt's mood was depressed, and affect was mood-congruent.  Pt was tearful.  Pt endorsed ongoing suicidal ideation with intent and other depressive symptoms (see above).  She denied homicidal ideation, auditory/visual hallucination, and substance use.  She endorsed a history of self-cutting, but denied recent cutting.  Pt's speech was normal in rate, rhythm, and volume.  Thought processes were within normal range; thought content was goal-oriented.  There was no evidence of delusion.  Pt's judgment and insight were fair to poor.  Impulse control was deemed fair.  Consulted with Irving Burton, NP who recommended inpatient.  Pt accepted to [facility] 400-2, per [facility] RN.  Diagnosis: Major Depressive Disorder, Recurrent, Severe, w/o psychotic features  Past Medical History:  Past Medical History:  Diagnosis Date  . Allergy   . Anxiety   . Asthma   . Depression   . Seasonal allergies     No past surgical history on file.  Family History:  Family History  Problem Relation Age of Onset  . Adopted: Yes  . Family history unknown: Yes    Social History:  reports that she has never smoked. She does not have any smokeless tobacco history on file. She reports that she does not drink alcohol or use drugs.  Additional Social History:  Alcohol / Drug Use Pain Medications: See PTA Prescriptions: See PTA  Over the Counter: See PTA History of alcohol / drug use?: No history of alcohol / drug abuse  CIWA: CIWA-Ar BP: 122/79 Pulse Rate: 67 COWS:    Allergies:  Allergies  Allergen Reactions  . Cat Hair Extract     Home Medications:  Medications Prior to Admission  Medication Sig Dispense Refill  . albuterol (PROVENTIL HFA) 108 (90 BASE) MCG/ACT inhaler Inhale  2 puffs into the lungs daily as needed for wheezing or shortness of breath.    . dexmethylphenidate (FOCALIN XR) 10 MG 24 hr capsule Take 1 capsule (10 mg total) by mouth daily. 90 capsule 0  . FLUoxetine (PROZAC) 20 MG capsule Take 3 capsules (60 mg total) by mouth daily. 90 capsule 0  . meloxicam (MOBIC) 7.5 MG tablet Take daily for 2 weeks, then as needed. Take with food 30 tablet 2  . Norethindrone Acetate-Ethinyl Estrad-FE (LOESTRIN 24 FE) 1-20 MG-MCG(24) tablet Take 1 tablet by mouth daily.    . traZODone (DESYREL) 50 MG tablet Take 1 tablet (50 mg total) by mouth at bedtime as needed and may repeat dose one time if needed for sleep. 60 tablet 1    OB/GYN Status:  No LMP recorded.  General Assessment Data [facility] Assessment: In system Is this a Tele or Face-to-Face Assessment?: Face-to-Face Is this an Initial Assessment or a Re-assessment for this encounter?: Initial Assessment Marital status: Single Is patient pregnant?: No Pregnancy Status: No Living Arrangements: Parent Can pt return to current living arrangement?: Yes Admission Status: Voluntary Is patient capable of signing voluntary admission?: Yes Referral Source: Self/Family/Friend Insurance type: UMR  Medical Screening Exam [facility] Walk[facility] ONLY) Medical Exam completed: Yes  Crisis Care Plan Living Arrangements: Parent Name of Psychiatrist: Dr. Polly CobiaGerard Taylor, [name] Name of Therapist: [facility]  Education Status Is patient currently in school?: No (Pt was at [employer], taking a gap year, not going to return there) Highest grade of school patient has completed: Some college  Risk to self with the past 6 months Suicidal Ideation: Yes-Currently Present Has patient been a risk to self within the past 6 months prior to admission? : Yes Suicidal Intent: Yes-Currently Present Has patient had any suicidal intent within the past 6 months prior to admission? :  Yes Is patient at risk for suicide?: Yes Suicidal Plan?: Yes-Currently Present Has patient had any suicidal plan within the past 6 months prior to admission? : Other (comment) Specify Current Suicidal Plan: "I think about all the ways I could do it" What has been your use of drugs/alcohol within the last 12 months?: None Previous Attempts/Gestures: No Other Self Harm Risks: Hx of self-injury Intentional Self Injurious Behavior: Cutting Comment - Self Injurious Behavior: Hx of cutting, not currently Family Suicide History: No Recent stressful life event(s): Other (Comment) (Pt cannot identify any recent stressor) Persecutory voices/beliefs?: No Depression: Yes Depression Symptoms: Despondent, Tearfulness, Isolating, Fatigue, Loss of interest in usual pleasures, Feeling worthless/self pity (Poor appetite) Substance abuse history and/or treatment for substance abuse?: No Suicide prevention information given to non-admitted patients: Not applicable  Risk to Others within the past 6 months Homicidal Ideation: No Does patient have any lifetime risk of violence toward others beyond the six months prior to admission? : No Thoughts of Harm to Others: No Current Homicidal Intent: No Current Homicidal Plan: No Access to Homicidal Means: No History of harm to others?: No Assessment of Violence: None Noted Does patient have access to weapons?: No Criminal Charges  Pending?: No Does patient have a court date: No Is patient on probation?: No  Psychosis Hallucinations: None noted Delusions: None noted  Mental Status Report Appearance/Hygiene: Unremarkable, Other (Comment) (Street clothes, appropriately groomed) Eye Contact: Good Motor Activity: Unremarkable, Freedom of movement Speech: Logical/coherent, Unremarkable Level of Consciousness: Alert, Crying Mood: Depressed Affect: Appropriate to circumstance, Depressed Anxiety Level: None Thought Processes: Coherent, Relevant Judgement:  Unimpaired Orientation: Person, Place, Time, Situation Obsessive Compulsive Thoughts/Behaviors: None  Cognitive Functioning Concentration: Good Memory: Remote Intact, Recent Intact IQ: Average Insight: Poor Impulse Control: Fair Appetite: Poor Sleep: No Change Total Hours of Sleep: 8 Vegetative Symptoms: None  ADLScreening [facility] Assessment Services) Patient's cognitive ability adequate to safely complete daily activities?: Yes Patient able to express need for assistance with ADLs?: Yes Independently performs ADLs?: Yes (appropriate for developmental age)  Prior Inpatient Therapy Prior Inpatient Therapy: No  Prior Outpatient Therapy Prior Outpatient Therapy: Yes Prior Therapy Dates: Ongoing Prior Therapy Facilty/Provider(s): [facility] Reason for Treatment: Major Depressive Disorder Does patient have an ACCT team?: No Does patient have Intensive In-House Services?  : No Does patient have [facility] services? : No Does patient have P4CC services?: No  ADL Screening (condition at time of admission) Patient's cognitive ability adequate to safely complete daily activities?: Yes Is the patient deaf or have difficulty hearing?: No Does the patient have difficulty seeing, even when wearing glasses/contacts?: No Does the patient have difficulty concentrating, remembering, or making decisions?: No Patient able to express need for assistance with ADLs?: Yes Does the patient have difficulty dressing or bathing?: No Independently performs ADLs?: Yes (appropriate for developmental age) Does the patient have difficulty walking or climbing stairs?: No Weakness of Legs: None Weakness of Arms/Hands: None  Home Assistive Devices/Equipment Home Assistive Devices/Equipment: None  Therapy Consults (therapy consults require a physician order) PT Evaluation Needed: No OT Evalulation Needed: No SLP Evaluation Needed: No Abuse/Neglect Assessment (Assessment to be complete  while patient is alone) Physical Abuse: Denies Verbal Abuse: Yes, past (Comment) (Pt endorsed bullying in elementary school) Sexual Abuse: Denies Exploitation of patient/patient's resources: Denies Self-Neglect: Denies Values / Beliefs Cultural Requests During Hospitalization: None Spiritual Requests During Hospitalization: None Consults Spiritual Care Consult Needed: No Social Work Consult Needed: No Merchant navy officerAdvance Directives (For Healthcare) Does Patient Have a Medical Advance Directive?: No    Additional Information 1:1 In Past 12 Months?: No CIRT Risk: No Elopement Risk: No Does patient have medical clearance?: Yes     Disposition:  Disposition Initial Assessment Completed for this Encounter: Yes Disposition of Patient: Inpatient treatment program Type of inpatient treatment program: Adult (Per L. Arville CareParks, NP, Pt meets inpatient criteria)  On Site Evaluation by:   Reviewed with Physician:    Dorris FetchEugene T Tranell Wojtkiewicz 08/20/2016 5:35 PM

## 2016-08-21 ENCOUNTER — Ambulatory Visit (HOSPITAL_COMMUNITY): Payer: Self-pay | Admitting: Psychiatry

## 2016-08-21 DIAGNOSIS — Z91018 Allergy to other foods: Secondary | ICD-10-CM

## 2016-08-21 LAB — COMPREHENSIVE METABOLIC PANEL
ALK PHOS: 34 U/L — AB (ref 38–126)
ALT: 15 U/L (ref 14–54)
AST: 19 U/L (ref 15–41)
Albumin: 4.7 g/dL (ref 3.5–5.0)
Anion gap: 10 (ref 5–15)
BUN: 16 mg/dL (ref 6–20)
CALCIUM: 9.7 mg/dL (ref 8.9–10.3)
CHLORIDE: 105 mmol/L (ref 101–111)
CO2: 24 mmol/L (ref 22–32)
CREATININE: 0.87 mg/dL (ref 0.44–1.00)
Glucose, Bld: 83 mg/dL (ref 65–99)
Potassium: 4 mmol/L (ref 3.5–5.1)
Sodium: 139 mmol/L (ref 135–145)
Total Bilirubin: 1.5 mg/dL — ABNORMAL HIGH (ref 0.3–1.2)
Total Protein: 8.4 g/dL — ABNORMAL HIGH (ref 6.5–8.1)

## 2016-08-21 LAB — HEPATIC FUNCTION PANEL
ALBUMIN: 4.7 g/dL (ref 3.5–5.0)
ALK PHOS: 35 U/L — AB (ref 38–126)
ALT: 14 U/L (ref 14–54)
AST: 20 U/L (ref 15–41)
BILIRUBIN INDIRECT: 1.1 mg/dL — AB (ref 0.3–0.9)
Bilirubin, Direct: 0.2 mg/dL (ref 0.1–0.5)
TOTAL PROTEIN: 8.3 g/dL — AB (ref 6.5–8.1)
Total Bilirubin: 1.3 mg/dL — ABNORMAL HIGH (ref 0.3–1.2)

## 2016-08-21 LAB — LIPID PANEL
CHOL/HDL RATIO: 2.1 ratio
CHOLESTEROL: 157 mg/dL (ref 0–200)
HDL: 75 mg/dL (ref >40)
LDL Cholesterol: 66 mg/dL (ref 0–99)
Triglycerides: 81 mg/dL (ref <150)
VLDL: 16 mg/dL (ref 0–40)

## 2016-08-21 LAB — CBC
HCT: 40.9 % (ref 36.0–46.0)
Hemoglobin: 13.3 g/dL (ref 12.0–15.0)
MCH: 28.1 pg (ref 26.0–34.0)
MCHC: 32.5 g/dL (ref 30.0–36.0)
MCV: 86.5 fL (ref 78.0–100.0)
PLATELETS: 285 10*3/uL (ref 150–400)
RBC: 4.73 MIL/uL (ref 3.87–5.11)
RDW: 12.8 % (ref 11.5–15.5)
WBC: 5.2 10*3/uL (ref 4.0–10.5)

## 2016-08-21 LAB — TSH: TSH: 2.194 u[IU]/mL (ref 0.350–4.500)

## 2016-08-21 MED ORDER — IBUPROFEN 800 MG PO TABS: 800.0000 mg | ORAL_TABLET | Freq: Three times a day (TID) | ORAL | Status: DC | PRN

## 2016-08-21 MED ORDER — FLUOXETINE HCL 20 MG PO CAPS
40.0000 mg | ORAL_CAPSULE | Freq: Every day | ORAL | Status: DC
  Administered 2016-08-22 – 2016-08-24 (×3): 40 mg via ORAL
  Filled 2016-08-21 (×6): qty 2

## 2016-08-21 MED ORDER — MIRTAZAPINE 7.5 MG PO TABS
7.5000 mg | ORAL_TABLET | Freq: Every day | ORAL | Status: DC
  Administered 2016-08-21: 7.5 mg via ORAL
  Filled 2016-08-21 (×3): qty 1

## 2016-08-21 NOTE — Plan of Care (Signed)
Problem: Safety: Goal: Periods of time without injury will increase Outcome: Progressing Pt. remains a low fall risk, denies SI/HI/AVH at this time, Q 15 checks in effect.    

## 2016-08-21 NOTE — [facility] Group Notes (Signed)
[facility] Group Therapy 08/21/2016 1:15pm  Type of Therapy: [employer] Presentation  Participation Level: Active  Participation Quality: Attentive  Affect: Appropriate  Cognitive: Oriented  Insight: Developing/Improving  Engagement in Therapy: Engaged  Modes of Intervention: Discussion, Education and Socialization  Summary of Progress/Problems: Mental [employer] Association (MHA) Speaker came to talk about his personal journey with substance abuse and addiction. The pt processed ways by which to relate to the speaker. MHA speaker provided handouts and educational information pertaining to groups and services offered by the MHA. Pt was engaged in speaker's presentation and was receptive to resources provided.    Rebekah Freeze, LCSW 08/21/2016 2:04 PM  

## 2016-08-21 NOTE — Psych (Signed)
   [facility]  Rebekah Tate [id number]  Session Time: 9 AM - 2 PM  Participation Level: Minimal  Behavioral Response: CasualAlertDepressed  Type of Therapy: Group Therapy  Treatment Goals addressed: Coping  Interventions: CBT, DBT, Supportive and Reframing  Summary: Clinician facilitated check-in regarding current stressors and situation, and review of patient completed diary card. Clinician utilized active listening and empathetic response and validated patient emotions. Clinician facilitated discussion on energy, fear, and honesty. Clinician introduced topic of needs. Group completed first half of needs assessment and discussed ways to meet needs which could be improved upon. Clinician assessed for immediate needs, medication compliance and efficacy, and safety concerns.    Suicidal/Homicidal: Nowithout intent/plan  Therapist Response: Rebekah Tate is a 20 y.o. female who presents with depression symptoms. Patient arrived within time allowed and reports she is feeling "fine." Patient rates her mood at a 4 on a 1- 10 scale with 10 being great. Patient reports having thoughts to self harm last night and felt "anxious about being upbeat."  Patient engaged in activity and discussion.  Patient participated in needs assessment and identified working towards future goals as area which she would like to improve. Patient stated ways in which she can increase meeting his needs in those areas.  Patient denies SI/HI/self-harm thoughts.    Plan: Patient will continue in PHP and medication management. Work towards decreasing depression symptoms and increase emotional regulation and positive coping skills.    Diagnosis: Severe recurrent major depression without psychotic features (HCC) [F33.2]    1. Severe recurrent major depression without psychotic features [facility](HCC)       Donia GuilesJenny Kenise Barraco, LCSW 08/21/2016

## 2016-08-21 NOTE — Progress Notes (Signed)
D: Pt. is up and visible in the milieu, seen interacting with her peers. Denies having any SI/HI/AVH/Pain at this time. Pt. presents with a flat affect and mood. Pt. is minimal & cautious with interaction. Pt. states "I don't want to be here right now; I just want to go home". Pt. was encourage to make the best of the situation and following POC. Pt. was receptive to interaction.   A: Encouragement and support given. Meds. ordered and given.   R: 1:1 interaction in private. Safety maintained with Q 15 checks. No other questions/concerns at this time.

## 2016-08-21 NOTE — H&P (Addendum)
Psychiatric Admission Assessment Adult  Patient Identification: Rebekah Tate MRN:  [id number] Date of Evaluation:  08/21/2016 Chief Complaint: " Yesterday I just felt worse, I felt unsafe" Principal Diagnosis: Severe recurrent major depression without psychotic features ([place]) Diagnosis:   Patient Active Problem List   Diagnosis Date Noted  . Suicidal ideation [R45.851] 08/20/2016  . MDD (major depressive disorder), recurrent severe, without psychosis ([place]) [F33.2] 08/20/2016  . 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 Trapezius muscle spasm [M62.838] 07/24/2016  . ADD (attention deficit disorder) without hyperactivity [F98.8] 05/15/2016  . Anxiety [F41.9] 08/29/2011  . Severe recurrent major depression without psychotic features ([place]) [F33.2] 08/29/2011   History of Present Illness: 20 year old single female, lives with parents. Had been enrolled in PHP x 1 week. She reports history of depression, anxiety and had been feeling worse recently due to which she had enrolled in PHP. States that yesterday she felt more intensely depressed, hopeless,  anxious, and "I started  feeling unsafe". States she had increased suicidal ideations, which although did not have any plan or intent, were " intense, and I felt scared" due to which she asked her mother to bring her to ED. States there was no clear trigger for increased depression yesterday. She describes a history of chronic depression starting in adolescence, which has been  intermittent . In addition to depression as related above, she describes significant and chronic anxiety, which she describes as worrying excessively .  Associated Signs/Symptoms: Depression Symptoms:  depressed mood, anhedonia, suicidal thoughts without plan, decreased appetite, has lost " some " weight (Hypo) Manic Symptoms:  Does not endorse  Anxiety Symptoms:  Excessive anxiety / worry  Psychotic Symptoms: denies  PTSD Symptoms: Denies  Total  Time spent with patient: 45 minutes  Past Psychiatric History: reports history of depression, anxiety, as above. Describes depression as chronic, intermittent. History of anxiety, which she characterizes mostly as excessive worrying. Denies agoraphobia.  Does describe some social anxiety/phobia, with severe anxiety when interacting in front of others, especially if feeling judged. Denies history of psychosis, denies history of mania or hypomania , no history of PTSD. History of self cutting, last time was one month ago. No history of suicide attempts. Of note, chart notes indicate that there has been suspicion of an underlying eating disorder. Patient does admit she is very conscious of her weight, but does not endorse vomiting ,purging. BMI is 19.7  Denies history of violence .   Is the patient at risk to self? Yes.    Has the patient been a risk to self in the past 6 months? Yes.    Has the patient been a risk to self within the distant past? Yes.    Is the patient a risk to others? No.  Has the patient been a risk to others in the past 6 months? No.  Has the patient been a risk to others within the distant past? No.   Prior Inpatient Therapy: no prior psychiatric admissions  Prior Outpatient Therapy: Prior Outpatient Therapy: Yes Prior Therapy Dates: Ongoing Prior Therapy Facilty/Provider(s): [facility] Reason for Treatment: Major Depressive Disorder Does patient have an ACCT team?: No Does patient have Intensive In-House Services?  : No Does patient have [facility] services? : No Does patient have P4CC services?: No  Was following up with Dr. Dwyane Dee for medication management .  Alcohol Screening: Patient refused Alcohol Screening Tool: Yes 1. How often do you have a drink containing alcohol?: Never 9. Have  you or someone else been injured as a result of your drinking?: No 10. Has a relative or friend or a doctor or another health worker been concerned about your drinking  or suggested you cut down?: No Alcohol Use Disorder Identification Test Final Score (AUDIT): 0 Brief Intervention: AUDIT score less than 7 or less-screening does not suggest unhealthy drinking-brief intervention not indicated Substance Abuse History in the last 12 months:  Denies alcohol abuse, denies drug abuse, but smokes cannabis 1-2 x per month Consequences of Substance Abuse: Denies  Previous Psychotropic Medications: has been on Prozac and Focalin for ADHD- has been on these for 2 + years .  Psychological Evaluations:  No  Past Medical History:  Past Medical History:  Diagnosis Date  . Allergy   . Anxiety   . Asthma   . Depression   . Seasonal allergies     Past Surgical History:  Procedure Laterality Date  . NO PAST SURGERIES     Family History: parents alive, live together, no siblings , adopted  Family History  Problem Relation Age of Onset  . Adopted: Yes  . Family history unknown: Yes   Family Psychiatric  History: no knowledge of biological parents' psychiatric history. Adoptive parents have history of depression. No suicides in family, no substance abuse in family. Tobacco Screening: Have you used any form of tobacco in the last 30 days? (Cigarettes, Smokeless Tobacco, Cigars, and/or Pipes): No Social History: single, no children, no SO, employed, but currently taking time off to focus on PHP.She was working at a [employer] . Denies legal issues. Parents described as supportive . 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  History  Alcohol Use No     History  Drug Use  . Types: Marijuana    Comment: once or twice a month    Additional Social History: Marital status: Single Does patient have children?: No    Pain Medications: See PTA Prescriptions: See PTA Over the Counter: See PTA History of alcohol  / drug use?: No history of alcohol / drug abuse  Allergies:   Allergies  Allergen Reactions  . Cat Hair Extract Other (See Comments)    Sneezing, rash, hives   Lab Results:  Results for orders placed or performed during the hospital encounter of 08/20/16 (from the past 48 hour(s))  CBC     Status: None   Collection Time: 08/21/16  6:53 AM  Result Value Ref Range   WBC 5.2 4.0 - 10.5 K/uL   RBC 4.73 3.87 - 5.11 MIL/uL   Hemoglobin 13.3 12.0 - 15.0 g/dL   HCT 40.9 36.0 - 46.0 %   MCV 86.5 78.0 - 100.0 fL   MCH 28.1 26.0 - 34.0 pg   MCHC 32.5 30.0 - 36.0 g/dL   RDW 12.8 11.5 - 15.5 %   Platelets 285 150 - 400 K/uL    Comment: Performed at [facility]  Lipid panel     Status: None   Collection Time: 08/21/16  6:53 AM  Result Value Ref Range   Cholesterol 157 0 - 200 mg/dL   Triglycerides 81 <150 mg/dL   HDL 75 >40 mg/dL   Total CHOL/HDL Ratio 2.1 RATIO   VLDL 16 0 - 40 mg/dL   LDL Cholesterol 66 0 - 99 mg/dL    Comment:  Total Cholesterol/HDL:CHD Risk Coronary Heart Disease Risk Table                     Men   Women  1/2 Average Risk   3.4   3.3  Average Risk       5.0   4.4  2 X Average Risk   9.6   7.1  3 X Average Risk  23.4   11.0        Use the calculated Patient Ratio above and the CHD Risk Table to determine the patient's CHD Risk.        ATP III CLASSIFICATION (LDL):  <100     mg/dL   Optimal  100-129  mg/dL   Near or Above                    Optimal  130-159  mg/dL   Borderline  160-189  mg/dL   High  >190     mg/dL   Very High Performed at [facility]   TSH     Status: None   Collection Time: 08/21/16  6:53 AM  Result Value Ref Range   TSH 2.194 0.350 - 4.500 uIU/mL    Comment: Performed by a 3rd Generation assay with a functional sensitivity of <=0.01 uIU/mL. Performed at [facility]   Hepatic function panel     Status: Abnormal   Collection Time: 08/21/16  6:53 AM  Result Value Ref Range    Total Protein 8.3 (H) 6.5 - 8.1 g/dL   Albumin 4.7 3.5 - 5.0 g/dL   AST 20 15 - 41 U/L   ALT 14 14 - 54 U/L   Alkaline Phosphatase 35 (L) 38 - 126 U/L   Total Bilirubin 1.3 (H) 0.3 - 1.2 mg/dL   Bilirubin, Direct 0.2 0.1 - 0.5 mg/dL   Indirect Bilirubin 1.1 (H) 0.3 - 0.9 mg/dL    Comment: Performed at [facility]  Comprehensive metabolic panel     Status: Abnormal   Collection Time: 08/21/16  6:53 AM  Result Value Ref Range   Sodium 139 135 - 145 mmol/L   Potassium 4.0 3.5 - 5.1 mmol/L   Chloride 105 101 - 111 mmol/L   CO2 24 22 - 32 mmol/L   Glucose, Bld 83 65 - 99 mg/dL   BUN 16 6 - 20 mg/dL   Creatinine, Ser 0.87 0.44 - 1.00 mg/dL   Calcium 9.7 8.9 - 10.3 mg/dL   Total Protein 8.4 (H) 6.5 - 8.1 g/dL   Albumin 4.7 3.5 - 5.0 g/dL   AST 19 15 - 41 U/L   ALT 15 14 - 54 U/L   Alkaline Phosphatase 34 (L) 38 - 126 U/L   Total Bilirubin 1.5 (H) 0.3 - 1.2 mg/dL   GFR calc non Af Amer >60 >60 mL/min   GFR calc Af Amer >60 >60 mL/min    Comment: (NOTE) The eGFR has been calculated using the CKD EPI equation. This calculation has not been validated in all clinical situations. eGFR's persistently <60 mL/min signify possible Chronic Kidney Disease.    Anion gap 10 5 - 15    Comment: Performed at [facility]    Blood Alcohol level:  No results found for: [facility]  Metabolic Disorder Labs:  No results found for: HGBA1C, MPG No results found for: PROLACTIN Lab Results  Component Value Date   CHOL 157 08/21/2016   TRIG 81 08/21/2016  HDL 75 08/21/2016   CHOLHDL 2.1 08/21/2016   VLDL 16 08/21/2016   LDLCALC 66 08/21/2016    Current Medications: Current Facility-Administered Medications  Medication Dose Route Frequency Provider Last Rate Last Dose  . acetaminophen (TYLENOL) tablet 650 mg  650 mg Oral Q6H PRN Ethelene Hal, NP      . alum & mag hydroxide-simeth (MAALOX/MYLANTA) 200-200-20 MG/5ML suspension 30 mL  30 mL Oral Q4H PRN  Ethelene Hal, NP      . dexmethylphenidate (FOCALIN XR) 24 hr capsule 10 mg  10 mg Oral Daily Ethelene Hal, NP   10 mg at 08/21/16 0926  . FLUoxetine (PROZAC) capsule 10 mg  10 mg Oral Daily Ethelene Hal, NP   10 mg at 08/21/16 3086  . hydrOXYzine (ATARAX/VISTARIL) tablet 25 mg  25 mg Oral Q6H PRN Ethelene Hal, NP      . magnesium hydroxide (MILK OF MAGNESIA) suspension 30 mL  30 mL Oral Daily PRN Ethelene Hal, NP      . Norethin-Eth Estradiol-Fe [facility] [name]) tablet 1 tablet  1 tablet Oral Daily Ethelene Hal, NP      . traZODone (DESYREL) tablet 50 mg  50 mg Oral QHS PRN Ethelene Hal, NP       PTA Medications: Prescriptions Prior to Admission  Medication Sig Dispense Refill Last Dose  . albuterol (PROVENTIL HFA) 108 (90 BASE) MCG/ACT inhaler Inhale 2 puffs into the lungs daily as needed for wheezing or shortness of breath.   Past Week at Unknown time  . dexmethylphenidate (FOCALIN XR) 10 MG 24 hr capsule Take 1 capsule (10 mg total) by mouth daily. 90 capsule 0 08/20/2016 at Unknown time  . FLUoxetine (PROZAC) 20 MG capsule Take 3 capsules (60 mg total) by mouth daily. 90 capsule 0 08/20/2016 at Unknown time  . Norethindrone Acetate-Ethinyl Estrad-FE (LOESTRIN 24 FE) 1-20 MG-MCG(24) tablet Take 1 tablet by mouth daily.   08/20/2016 at Unknown time  . traZODone (DESYREL) 50 MG tablet Take 1 tablet (50 mg total) by mouth at bedtime as needed and may repeat dose one time if needed for sleep. 60 tablet 1 Past Week at Unknown time  . meloxicam (MOBIC) 7.5 MG tablet Take daily for 2 weeks, then as needed. Take with food (Patient not taking: Reported on 08/21/2016) 30 tablet 2 Not Taking at Unknown time    Musculoskeletal: Strength & Muscle Tone: within normal limits Gait & Station: normal Patient leans: N/A  Psychiatric Specialty Exam: Physical Exam  Review of Systems  Constitutional: Negative.   HENT: Negative.    Eyes: Negative.   Respiratory: Negative.   Cardiovascular: Negative.   Gastrointestinal: Positive for nausea. Negative for diarrhea and vomiting.  Genitourinary: Negative.   Musculoskeletal: Negative.   Skin: Negative.   Neurological: Negative for seizures.  Endo/Heme/Allergies: Negative.   Psychiatric/Behavioral: Positive for depression and suicidal ideas. The patient is nervous/anxious.   All other systems reviewed and are negative.   Blood pressure 109/66, pulse 62, temperature 99.2 F (37.3 C), temperature source Oral, resp. rate 18, height 4' 11.75" (1.518 m), weight 100 lb 8 oz (45.6 kg), SpO2 100 %.Body mass index is 19.79 kg/m.  General Appearance: Well Groomed  Eye Contact:  Good  Speech:  Normal Rate  Volume:  Normal  Mood:  depressed, anxious, but reports feeling slightly better today  Affect:  mildly constricted   Thought Process:  Linear  Orientation:  Full (Time, Place, and Person)  Thought Content:  no hallucinations, no delusions , not internally preoccupied   Suicidal Thoughts:  No denies any current suicidal or self injurious ideations   Homicidal Thoughts:  No denies any homicidal or violent ideations   Memory:  recent and remote grossly intact   Judgement:  Fair  Insight:  Fair  Psychomotor Activity:  Normal  Concentration:  Concentration: Good and Attention Span: Good  Recall:  Good  Fund of Knowledge:  Good  Language:  Good  Akathisia:  Negative  Handed:  Right  AIMS (if indicated):     Assets:  Desire for Improvement Resilience  ADL's:  Intact  Cognition:  WNL  Sleep:       Treatment Plan Summary: Daily contact with patient to assess and evaluate symptoms and progress in treatment, Medication management, Plan inpatient admission and medications as below  Observation Level/Precautions:  15 minute checks  Laboratory:  as needed   Psychotherapy:  Milieu, group therapy   Medications:  We discussed, patient states she has been on Prozac and  Focalin for years, and in general has done well .  At this time prefers to continue these medications . Does agree to add Remeron for depression, insomnia, anxiety. Side effects discussed .  Consultations:  As needed   Discharge Concerns:    Estimated LOS:  Other:     Physician Treatment Plan for Primary Diagnosis: Severe recurrent major depression without psychotic features ([place]) Long Term Goal(s): Improvement in symptoms so as ready for discharge  Short Term Goals: Ability to verbalize feelings will improve, Ability to disclose and discuss suicidal ideas, Ability to demonstrate self-control will improve, Ability to identify and develop effective coping behaviors will improve and Ability to maintain clinical measurements within normal limits will improve  Physician Treatment Plan for Secondary Diagnosis: Principal Problem:   Severe recurrent major depression without psychotic features ([place]) Active Problems:   Anxiety   ADD (attention deficit disorder) without hyperactivity   Suicidal ideation   MDD (major depressive disorder), recurrent severe, without psychosis ([place])  Long Term Goal(s): Improvement in symptoms so as ready for discharge  Short Term Goals: Ability to verbalize feelings will improve, Ability to disclose and discuss suicidal ideas, Ability to demonstrate self-control will improve, Ability to identify and develop effective coping behaviors will improve and Ability to maintain clinical measurements within normal limits will improve  I certify that inpatient services furnished can reasonably be expected to improve the patient's condition.    Neita Garnet, MD 11/30/20174:16 PM

## 2016-08-21 NOTE — Progress Notes (Signed)
Ms Rebekah Tate was admitted to the inpatient unit with suicidal ideation  Plan:  [facility]  Do not see any usefulness in returning to the program as she herself dismisses the usefulness of what we offer.  Will reconsider after discharge

## 2016-08-21 NOTE — Progress Notes (Signed)
D: Pt stayed in her room majority of the evening.  A: Pt was offered support and encouragement. Pt was given scheduled medications. Pt was encourage to attend groups. Q 15 minute checks were done for safety.   R: safety maintained on unit.

## 2016-08-21 NOTE — Progress Notes (Signed)
EKG completed and shown to MD. Urine sample requested and placed cup in pt's room.

## 2016-08-21 NOTE — [facility] Group Notes (Signed)
Patient attend group. Her day was a 7. She had a good day she was able to go outside get fresh air pleasant.  She had a good visit with her parents it was passive aggressive. The visit was good. Sh miss her parents did not realize how much until today. She feels better about herself and care for her self.

## 2016-08-21 NOTE — Plan of Care (Signed)
Problem: Activity: Goal: Interest or engagement in activities will improve Outcome: Progressing Pt is out in the dayroom interacting with peers and attending groups on the unit.

## 2016-08-21 NOTE — [facility] Suicide Risk Assessment (Signed)
[facility]   Nursing information obtained from:  Patient Demographic factors:  Adolescent or young adult Current Mental Status:  Suicidal ideation indicated by patient Loss Factors:  NA Historical Factors:  Prior suicide attempts Risk Reduction Factors:  Living with another person, especially a relative  Total Time spent with patient: 45 minutes Principal Problem: Severe recurrent major depression without psychotic features ([facility]) Diagnosis:   Patient Active Problem List   Diagnosis Date Noted  . Suicidal ideation [R45.851] 08/20/2016  . MDD (major depressive disorder), recurrent severe, without psychosis ([facility]) [F33.2] 08/20/2016  . 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 Trapezius muscle spasm [M62.838] 07/24/2016  . ADD (attention deficit disorder) without hyperactivity [F98.8] 05/15/2016  . Anxiety [F41.9] 08/29/2011  . Severe recurrent major depression without psychotic features ([facility]) [F33.2] 08/29/2011   Subjective Data:    Continued Clinical Symptoms:  Alcohol Use Disorder Identification Test Final Score (AUDIT): 0 The "Alcohol Use Disorders Identification Test", Guidelines for Use in Primary Care, Second Edition.  World Science writerHealth Organization [facility](WHO). Score between 0-7:  no or low risk or alcohol related problems. Score between 8-15:  moderate risk of alcohol related problems. Score between 16-19:  high risk of alcohol related problems. Score 20 or above:  warrants further diagnostic evaluation for alcohol dependence and treatment.   CLINICAL FACTORS:  20 year old single  Female taking time off college, lives with parents , history of depression, anxiety, had enrolled in PHP. Yesterday had acute episode of increased anxiety and vague suicidal ideations, which led to admission. Chart notes indicate concern about possible underlying eating disorder, which patient currently does not endorse/denies . BMI 20. Denies any purging or  vomiting.      Psychiatric Specialty Exam: Physical Exam  ROS  Blood pressure 109/66, pulse 62, temperature 99.2 F (37.3 C), temperature source Oral, resp. rate 18, height 4' 11.75" (1.518 m), weight 100 lb 8 oz (45.6 kg), SpO2 100 %.Body mass index is 19.79 kg/m.   see admit note MSE    COGNITIVE FEATURES THAT CONTRIBUTE TO RISK:  Closed-mindedness and Loss of executive function    SUICIDE RISK:   Moderate:  Frequent suicidal ideation with limited intensity, and duration, some specificity in terms of plans, no associated intent, good self-control, limited dysphoria/symptomatology, some risk factors present, and identifiable protective factors, including available and accessible social support.   PLAN OF CARE: Patient will be admitted to inpatient psychiatric unit for stabilization and safety. Will provide and encourage milieu participation. Provide medication management and maked adjustments as needed.  Will follow daily.    I certify that inpatient services furnished can reasonably be expected to improve the patient's condition.  Nehemiah MassedOBOS, FERNANDO, MD 08/21/2016, 5:07 PM

## 2016-08-21 NOTE — Progress Notes (Signed)
D:Pt reports that prior to coming to the hospital she did not feel safe while at home and had been attending a partial hospital program. Pt reports that her parents are supportive and she felt that she needed additional help with depression and anxiety. Pt said that she was exhausted and slept well last night. She reports that she is feeling better today compared to yesterday and rated her day as a 5 on 0-10 scale with 10 being the worst.  A:Offered support, encouragement and 15 minute checks. Medications given as ordered. R:Pt denies si and hi. Safety maintained on the unit.

## 2016-08-21 NOTE — [facility] Counselor (Signed)
Adult Comprehensive Assessment  Patient ID: Rebekah Tate, female   DOB: 1996-09-21, 20 y.o.   MRN: [id number]  Information Source: Information source: Patient  Current Stressors:  Educational / Learning stressors: Pt reports that she is taking a gap year from school and is nervous about returning Employment / Job issues: None reported Family Relationships: None reported Surveyor, quantityinancial / Lack of resources (include bankruptcy): None reported Housing / Lack of housing: None reported Physical health (include injuries & life threatening diseases): Stomach has been upset more often and has not been eating as much Social relationships: None reported Substance abuse: Pt denies Bereavement / Loss: Pt denies  Living/Environment/Situation:  Living Arrangements: Parent Living conditions (as described by patient or guardian): safe and stable How long has patient lived in current situation?: whole life other than 5463yrs at college What is atmosphere in current home: Supportive, Loving, Comfortable  Family History:  Marital status: Single Does patient have children?: No  Childhood History:  By whom was/is the patient raised?: Adoptive parents Additional childhood history information: adopted at age 56 Description of patient's relationship with caregiver when they were a child: parents were very supportive Patient's description of current relationship with people who raised him/her: Pt reports a positive relationship with her parents and that they are supportive.  Does patient have siblings?: No Did patient suffer any verbal/emotional/physical/sexual abuse as a child?: No Did patient suffer from severe childhood neglect?: No Was the patient ever a victim of a crime or a disaster?: No Has patient been effected by domestic violence as an adult?: No  Education:  Highest grade of school patient has completed: Some college (currently in a gap year) Currently a student?: No Learning disability?:  No  Employment/Work Situation:   Employment situation: Employed Where is patient currently employed?: [employer]; [employer] How long has patient been employed?: since May Patient's job has been impacted by current illness: Yes Describe how patient's job has been impacted: Pt has taken a break from [employer] to accomodate treatment What is the longest time patient has a held a job?: summers from 9th-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 Has patient ever been in the [employer] and Companymilitary?: No Has patient ever served in combat?: No Did You Receive Any Psychiatric Treatment/Services While in [employer]ities traderthe Military?: No Are There Guns or Other Weapons in Your Home?: No  Financial Resources:   Surveyor, quantityinancial resources: Support from parents / caregiver, Media plannerrivate insurance Does patient have a Lawyerrepresentative payee or guardian?: No  Alcohol/Substance Abuse:   What has been your use of drugs/alcohol within the last 12 months?: Pt denies If attempted suicide, did drugs/alcohol play a role in this?: No Alcohol/Substance Abuse Treatment Hx: Denies past history Has alcohol/substance abuse ever caused legal problems?: No  Social Support System:   Forensic psychologistatient's Community Support System: Good Describe Community Support System: family and friends are very supportive Type of faith/religion: Ephriam KnucklesChristian How does patient's faith help to cope with current illness?: involved in church community  Leisure/Recreation:   Leisure and Hobbies: 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 for meds) If no, would patient like referral for services when discharged?: No Does patient have financial barriers related to discharge medications?: No  Summary/Recommendations:     Patient is a 20 year old female with a diagnosis of Major Depressive Disorder. Pt presented to the hospital with thoughts of suicide and increased depression. Pt reports primary trigger(s) for admission include more intense and frequent thoughts of dying. Patient will benefit from crisis stabilization, medication evaluation, group therapy and psycho education in addition to case management for discharge planning. At discharge it is recommended that Pt remain compliant with established discharge plan and continued treatment.   Rebekah LennertLauren C Ty Tate. 08/21/2016

## 2016-08-21 NOTE — [facility] Group Notes (Signed)
[facility] Group Notes:  (Nursing/MHT/Case Management/Adjunct)  Date:  08/21/2016  Time:  11:30 AM  Type of Therapy:  Nurse Education  Participation Level:  Active  Participation Quality:  Appropriate  Affect:  Appropriate  Cognitive:  Appropriate  Insight:  Appropriate  Engagement in Group:  Engaged  Modes of Intervention:  Education  Summary of Progress/Problems: Patient's goal today is: "Get home a s soon as I can". She was able to identify, "Mindfullness", as one of her coping skills and to describe how it works for her.  Almira Barenny G Tandy Lewin 08/21/2016, 11:30 AM

## 2016-08-22 DIAGNOSIS — Z79899 Other long term (current) drug therapy: Secondary | ICD-10-CM

## 2016-08-22 DIAGNOSIS — F332 Major depressive disorder, recurrent severe without psychotic features: Principal | ICD-10-CM

## 2016-08-22 LAB — PROLACTIN: Prolactin: 27.2 ng/mL — ABNORMAL HIGH (ref 4.8–23.3)

## 2016-08-22 LAB — PREGNANCY, URINE: PREG TEST UR: NEGATIVE

## 2016-08-22 MED ORDER — TRAZODONE HCL 50 MG PO TABS
50.0000 mg | ORAL_TABLET | Freq: Every day | ORAL | Status: DC
  Administered 2016-08-22 – 2016-08-23 (×2): 50 mg via ORAL
  Filled 2016-08-22 (×4): qty 1

## 2016-08-22 NOTE — Tx Team (Signed)
Interdisciplinary Treatment and Diagnostic Plan Update  08/22/2016 Time of Session: 9:15 AM  Rebekah Tate MRN: [id number]  Principal Diagnosis: Severe recurrent major depression without psychotic features [facility])  Secondary Diagnoses: Principal Problem:   Severe recurrent major depression without psychotic features (Rebekah Tate) Active Problems:   Anxiety   ADD (attention deficit disorder) without hyperactivity   Suicidal ideation   MDD (major depressive disorder), recurrent severe, without psychosis (Rebekah Tate)   Current Medications:  Current Facility-Administered Medications  Medication Dose Route Frequency Provider Last Rate Last Dose  . acetaminophen (TYLENOL) tablet 650 mg  650 mg Oral Q6H PRN Ethelene Hal, NP      . alum & mag hydroxide-simeth (MAALOX/MYLANTA) 200-200-20 MG/5ML suspension 30 mL  30 mL Oral Q4H PRN Ethelene Hal, NP      . dexmethylphenidate (FOCALIN XR) 24 hr capsule 10 mg  10 mg Oral Daily Ethelene Hal, NP   10 mg at 08/22/16 0857  . FLUoxetine (PROZAC) capsule 40 mg  40 mg Oral Daily Jenne Campus, MD   40 mg at 08/22/16 0857  . hydrOXYzine (ATARAX/VISTARIL) tablet 25 mg  25 mg Oral Q6H PRN Ethelene Hal, NP      . magnesium hydroxide (MILK OF MAGNESIA) suspension 30 mL  30 mL Oral Daily PRN Ethelene Hal, NP      . mirtazapine (REMERON) tablet 7.5 mg  7.5 mg Oral QHS Jenne Campus, MD   7.5 mg at 08/21/16 2157  . Norethin-Eth Estradiol-Fe [facility] [name]) tablet 1 tablet  1 tablet Oral Daily Ethelene Hal, NP        PTA Medications: Prescriptions Prior to Admission  Medication Sig Dispense Refill Last Dose  . albuterol (PROVENTIL HFA) 108 (90 BASE) MCG/ACT inhaler Inhale 2 puffs into the lungs daily as needed for wheezing or shortness of breath.   Past Week at Unknown time  . dexmethylphenidate (FOCALIN XR) 10 MG 24 hr capsule Take 1 capsule (10 mg total) by mouth daily. 90 capsule 0 08/20/2016 at  Unknown time  . FLUoxetine (PROZAC) 20 MG capsule Take 3 capsules (60 mg total) by mouth daily. 90 capsule 0 08/20/2016 at Unknown time  . Norethindrone Acetate-Ethinyl Estrad-FE (LOESTRIN 24 FE) 1-20 MG-MCG(24) tablet Take 1 tablet by mouth daily.   08/20/2016 at Unknown time  . traZODone (DESYREL) 50 MG tablet Take 1 tablet (50 mg total) by mouth at bedtime as needed and may repeat dose one time if needed for sleep. 60 tablet 1 Past Week at Unknown time  . meloxicam (MOBIC) 7.5 MG tablet Take daily for 2 weeks, then as needed. Take with food (Patient not taking: Reported on 08/21/2016) 30 tablet 2 Not Taking at Unknown time    Treatment Modalities: Medication Management, Group therapy, Case management,  1 to 1 session with clinician, Psychoeducation, Recreational therapy.  Patient Stressors: Other: Pt denies any stressors  Patient Strengths: Occupational psychologist fund of knowledge Physical Health  Physician Treatment Plan for Primary Diagnosis: Severe recurrent major depression without psychotic features ([name]) Long Term Goal(s): Improvement in symptoms so as ready for discharge  Short Term Goals: Ability to verbalize feelings will improve Ability to disclose and discuss suicidal ideas Ability to demonstrate self-control will improve Ability to identify and develop effective coping behaviors will improve Ability to maintain clinical measurements within normal limits will improve Ability to verbalize feelings will improve Ability to disclose and discuss suicidal ideas Ability to demonstrate self-control will improve Ability  to identify and develop effective coping behaviors will improve Ability to maintain clinical measurements within normal limits will improve  Medication Management: Evaluate patient's response, side effects, and tolerance of medication regimen.  Therapeutic Interventions: 1 to 1 sessions, Unit Group sessions and Medication  administration.  Evaluation of Outcomes: Not Met  Physician Treatment Plan for Secondary Diagnosis: Principal Problem:   Severe recurrent major depression without psychotic features (Rebekah Tate) Active Problems:   Anxiety   ADD (attention deficit disorder) without hyperactivity   Suicidal ideation   MDD (major depressive disorder), recurrent severe, without psychosis (Rebekah Tate)   Long Term Goal(s): Improvement in symptoms so as ready for discharge  Short Term Goals: Ability to verbalize feelings will improve Ability to disclose and discuss suicidal ideas Ability to demonstrate self-control will improve Ability to identify and develop effective coping behaviors will improve Ability to maintain clinical measurements within normal limits will improve Ability to verbalize feelings will improve Ability to disclose and discuss suicidal ideas Ability to demonstrate self-control will improve Ability to identify and develop effective coping behaviors will improve Ability to maintain clinical measurements within normal limits will improve  Medication Management: Evaluate patient's response, side effects, and tolerance of medication regimen.  Therapeutic Interventions: 1 to 1 sessions, Unit Group sessions and Medication administration.  Evaluation of Outcomes: Not Met   RN Treatment Plan for Primary Diagnosis: Severe recurrent major depression without psychotic features (Rebekah Tate) Long Term Goal(s): Knowledge of disease and therapeutic regimen to maintain health will improve  Short Term Goals: Ability to verbalize feelings will improve, Ability to disclose and discuss suicidal ideas and Ability to identify and develop effective coping behaviors will improve  Medication Management: RN will administer medications as ordered by provider, will assess and evaluate patient's response and provide education to patient for prescribed medication. RN will report any adverse and/or side effects to prescribing  provider.  Therapeutic Interventions: 1 on 1 counseling sessions, Psychoeducation, Medication administration, Evaluate responses to treatment, Monitor vital signs and CBGs as ordered, Perform/monitor CIWA, COWS, AIMS and Fall Risk screenings as ordered, Perform wound care treatments as ordered.  Evaluation of Outcomes: Not Met   LCSW Treatment Plan for Primary Diagnosis: Severe recurrent major depression without psychotic features ([place]) Long Term Goal(s): Safe transition to appropriate next level of care at discharge, Engage patient in therapeutic group addressing interpersonal concerns.  Short Term Goals: Engage patient in aftercare planning with referrals and resources, Identify triggers associated with mental health/substance abuse issues and Increase skills for wellness and recovery  Therapeutic Interventions: Assess for all discharge needs, 1 to 1 time with Social worker, Explore available resources and support systems, Assess for adequacy in community support network, Educate family and significant other(s) on suicide prevention, Complete Psychosocial Assessment, Interpersonal group therapy.  Evaluation of Outcomes: Not Met   Progress in Treatment: Attending groups: Pt is new to milieu, continuing to assess  Participating in groups: Pt is new to milieu, continuing to assess  Taking medication as prescribed: Yes, MD continues to assess for medication changes as needed Toleration medication: Yes, no side effects reported at this time Family/Significant other contact made: Yes with mother Patient understands diagnosis: Yes AEB ability to articulate needs and willingness to seek treatment for depression and anxiety Discussing patient identified problems/goals with staff: Yes Medical problems stabilized or resolved: Yes Denies suicidal/homicidal ideation: Yes Issues/concerns per patient self-inventory: None Other: N/A  New problem(s) identified: None identified at this time.   New  Short Term/Long Term Goal(s): None identified at this  time.   Discharge Plan or Barriers: Pt will return home and follow-up with outpatient services.   Reason for Continuation of Hospitalization: Anxiety Depression Medication stabilization Suicidal ideation  Estimated Length of Stay: 2-4 days  Attendees: Patient: 08/22/2016  9:15 AM  Physician: Dr. Shea Evans, Dr. Sharolyn Douglas 08/22/2016  9:15 AM  Nursing: Gershon Crane, RN 08/22/2016  9:15 AM  RN Care Manager:  08/22/2016  9:15 AM  Social Worker: Adriana Reams, LCSW; Erasmo Downer Drinkard, LCSW 08/22/2016  9:15 AM  Recreational Therapist:  08/22/2016  9:15 AM  Other: Lindell Spar, NP 08/22/2016  9:15 AM  Other:  08/22/2016  9:15 AM  Other: 08/22/2016  9:15 AM    Scribe for Treatment Team: Gladstone Lighter, LCSW 08/22/2016 9:15 AM

## 2016-08-22 NOTE — Progress Notes (Signed)
D: Patient up and visible in the milieu. Spoke with patient 1:1. Rates sleep as fair, appetite as fair, energy as normal. Patient's affect anxious, blunted and mood irritable. Rating depression at a 3/10, hopelessness at a 2/10 and anxiety at a 4/10. States goal for today is to "have positive thoughts about myself." Denies pain, physical problems.   A: Medicated per orders, no prns given or requested. Emotional support offered and self inventory reviewed. Encouraged completion of Suicide Safety Plan. Discussed POC with MD, SW.    R: Patient verbalizes understanding of POC. Patient denies SI/HI and remains safe on level III obs.

## 2016-08-22 NOTE — Progress Notes (Signed)
Patient has been up and active on the unit, attended group this evening and participated. She reports that the medication she took last night for sleep made her too groggy this morning so she took trazadone instead. Patient currently denies having pain, -si/hi/a/v hall. Support and encouragement offered, safety maintained on unit, will continue to monitor.

## 2016-08-22 NOTE — Plan of Care (Signed)
Problem: Safety: Goal: Ability to disclose and discuss suicidal ideas will improve Outcome: Progressing Patient denies SI, thoughts to self harm.  Problem: Medication: Goal: Compliance with prescribed medication regimen will improve Outcome: Progressing Patient has been med compliant.   

## 2016-08-22 NOTE — Progress Notes (Signed)
Adult Psychoeducational Group Note  Date:  08/22/2016 Time:  8:53 PM  Group Topic/Focus:  Wrap-Up Group:   The focus of this group is to help patients review their daily goal of treatment and discuss progress on daily workbooks.   Participation Level:  Minimal  Participation Quality:  Appropriate  Affect:  Appropriate  Cognitive:  Alert  Insight: Appropriate  Engagement in Group:  Engaged  Modes of Intervention:  Discussion  Additional Comments:  Pt participated in group activity.  Kaleen OdeaCOOKE, Zanaya Baize R 08/22/2016, 8:53 PM

## 2016-08-22 NOTE — [facility] Group Notes (Signed)
[facility] LCSW Group Therapy 08/22/2016 1:15pm  Type of Therapy: Group Therapy- Feelings Around Relapse and Recovery  Participation Level: Active   Participation Quality:  Negative  Affect:  Flat, Irritable  Cognitive: Alert and Oriented   Insight:  Limited  Engagement in Therapy: Developing/Improving  Modes of Intervention: Clarification, Confrontation, Discussion, Education, Exploration, Limit-setting, Orientation, Problem-solving, Rapport Building, Dance movement psychotherapisteality Testing, Socialization and Support  Summary of Progress/Problems: The topic for today was feelings about relapse. The group discussed what relapse prevention is to them and identified triggers that they are on the path to relapse. Members also processed their feeling towards relapse and were able to relate to common experiences. Group also discussed coping skills that can be used for relapse prevention.  Pt mood appeared to be regressed today as her tone and affect were more irritable and participation was blame-placing. Pt commented multiple times how bored she was in the hospital and how "once she gets out of this place", maybe then she will get better. Pt did however encourage peers to be motivated by their own well-being rather than other people's happiness.    Therapeutic Modalities:   Cognitive Behavioral Therapy Solution-Focused Therapy Assertiveness Training Relapse Prevention Therapy    Rebekah FusiLauren Aldora Perman, LCSW [phone] 08/22/2016 3:21 PM

## 2016-08-22 NOTE — Progress Notes (Signed)
Recreation Therapy Notes  Date: 08/22/16 Time: 0930 Location: [address] Dayroom  Group Topic: Stress Management  Goal Area(s) Addresses:  Patient will verbalize importance of using healthy stress management.  Patient will identify positive emotions associated with healthy stress management.   Intervention: Stress Management  Activity :  Guided Imagery.  LRT introduced the stress management technique of guided imagery.  LRT read a script so patients could experience the concept of guided imagery.  Patients were to follow along as LRT read script to engage in activity.  Education:  Stress Management, Discharge Planning.   Education Outcome: Acknowledges edcuation/In group clarification offered/Needs additional education  Clinical Observations/Feedback: Pt did not attend group.   Caroll RancherMarjette Estrella Alcaraz, LRT/CTRS         Caroll RancherLindsay, Kerry-Anne Mezo A 08/22/2016 11:49 AM

## 2016-08-22 NOTE — Progress Notes (Signed)
[facility] MD Progress Note  08/22/2016 12:15 PM Rebekah Tate  MRN:  [id number]  Subjective:  Rebekah Tate reports, "I'm doing fine, it just that the medicine I took for sleep last night is making me very drowsy & irritable today. I will rather go back to taking Trazodone again. I feel very tired as well".  Objective: Rebekah Tate is a 20 year old Asian female with hx of major depression. She is seen today. Chart reviewed. She is alert & oriented x 3. Verbally responsive & aware of situation. She presents irritated & with flat affect. She is complaining of being tired & irritated because of the medicine that she took for sleep last night. She has requested for this medication to be discontinued & asked for Trazodone 50 mg that she used to take to be reinstated. She denies any SIHI, AVH, delusional thoughts or paranoia. 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 She is participating in group sessions.  Principal Problem: Severe recurrent major depression without psychotic features ([place]) Diagnosis:   Patient Active Problem List   Diagnosis Date Noted  . Suicidal ideation [R45.851] 08/20/2016  . MDD (major depressive disorder), recurrent severe, without psychosis ([place]) [F33.2] 08/20/2016  . 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 Trapezius muscle spasm [M62.838] 07/24/2016  . ADD (attention deficit disorder) without hyperactivity [F98.8] 05/15/2016  . Anxiety [F41.9] 08/29/2011  . Severe recurrent major depression without psychotic features ([place]) [F33.2] 08/29/2011   Total Time spent with patient: 25 minutes  Past Psychiatric History: ADHD  Past Medical History:  Past Medical History:  Diagnosis Date  . Allergy   . Anxiety   . Asthma   . Depression   . Seasonal allergies     Past Surgical History:  Procedure Laterality Date  . NO PAST SURGERIES     Family History:  Family History  Problem  Relation Age of Onset  . Adopted: Yes  . Family history unknown: Yes   Family Psychiatric  History: See H&P  Social History:  History  Alcohol Use No     History  Drug Use  . Types: Marijuana    Comment: once or twice a month    Social History   Social History  . Marital status: Married    Spouse name: N/A  . Number of children: N/A  . Years of education: N/A   Social History Main Topics  . Smoking status: Never Smoker  . Smokeless tobacco: Never Used  . Alcohol use No  . Drug use:     Types: Marijuana     Comment: once or twice a month  . Sexual activity: No   Other Topics Concern  . None   Social History Narrative  . None   Additional Social History:    Pain Medications: See PTA Prescriptions: See PTA Over the Counter: See PTA History of alcohol / drug use?: No history of alcohol / drug abuse  Sleep: Good  Appetite:  Good  Current Medications: Current Facility-Administered Medications  Medication Dose Route Frequency Provider Last Rate Last Dose  . acetaminophen (TYLENOL) tablet 650 mg  650 mg Oral Q6H PRN Ethelene Hal, NP      . alum & mag hydroxide-simeth (MAALOX/MYLANTA) 200-200-20 MG/5ML suspension 30 mL  30 mL Oral Q4H PRN Ethelene Hal, NP      . dexmethylphenidate (FOCALIN XR) 24 hr capsule 10 mg  10 mg Oral Daily Ethelene Hal, NP   10 mg at 08/22/16 0857  . FLUoxetine (PROZAC) capsule 40 mg  40 mg Oral Daily Jenne Campus, MD   40 mg at 08/22/16 0857  . hydrOXYzine (ATARAX/VISTARIL) tablet 25 mg  25 mg Oral Q6H PRN Ethelene Hal, NP      . magnesium hydroxide (MILK OF MAGNESIA) suspension 30 mL  30 mL Oral Daily PRN Ethelene Hal, NP      . mirtazapine (REMERON) tablet 7.5 mg  7.5 mg Oral QHS Jenne Campus, MD   7.5 mg at 08/21/16 2157  . Norethin-Eth Estradiol-Fe [name]) tablet 1 tablet  1 tablet Oral Daily Ethelene Hal, NP       Lab Results:  Results for orders  placed or performed during the hospital encounter of 08/20/16 (from the past 48 hour(s))  Pregnancy, urine     Status: None   Collection Time: 08/21/16  6:27 AM  Result Value Ref Range   Preg Test, Ur NEGATIVE NEGATIVE    Comment:        THE SENSITIVITY OF THIS METHODOLOGY IS >20 mIU/mL. Performed at [facility]   CBC     Status: None   Collection Time: 08/21/16  6:53 AM  Result Value Ref Range   WBC 5.2 4.0 - 10.5 K/uL   RBC 4.73 3.87 - 5.11 MIL/uL   Hemoglobin 13.3 12.0 - 15.0 g/dL   HCT 40.9 36.0 - 46.0 %   MCV 86.5 78.0 - 100.0 fL   MCH 28.1 26.0 - 34.0 pg   MCHC 32.5 30.0 - 36.0 g/dL   RDW 12.8 11.5 - 15.5 %   Platelets 285 150 - 400 K/uL    Comment: Performed at [facility]  Lipid panel     Status: None   Collection Time: 08/21/16  6:53 AM  Result Value Ref Range   Cholesterol 157 0 - 200 mg/dL   Triglycerides 81 <150 mg/dL   HDL 75 >40 mg/dL   Total CHOL/HDL Ratio 2.1 RATIO   VLDL 16 0 - 40 mg/dL   LDL Cholesterol 66 0 - 99 mg/dL    Comment:        Total Cholesterol/HDL:CHD Risk Coronary Heart Disease Risk Table                     Men   Women  1/2 Average Risk   3.4   3.3  Average Risk       5.0   4.4  2 X Average Risk   9.6   7.1  3 X Average Risk  23.4   11.0        Use the calculated Patient Ratio above and the CHD Risk Table to determine the patient's CHD Risk.        ATP III CLASSIFICATION (LDL):  <100     mg/dL   Optimal  100-129  mg/dL   Near or Above                    Optimal  130-159  mg/dL   Borderline  160-189  mg/dL   High  >190     mg/dL   Very High Performed at [facility]   TSH     Status: None   Collection Time: 08/21/16  6:53 AM  Result Value Ref Range   TSH 2.194 0.350 - 4.500 uIU/mL  Comment: Performed by a 3rd Generation assay with a functional sensitivity of <=0.01 uIU/mL. Performed at [facility]   Hepatic function panel     Status: Abnormal   Collection  Time: 08/21/16  6:53 AM  Result Value Ref Range   Total Protein 8.3 (H) 6.5 - 8.1 g/dL   Albumin 4.7 3.5 - 5.0 g/dL   AST 20 15 - 41 U/L   ALT 14 14 - 54 U/L   Alkaline Phosphatase 35 (L) 38 - 126 U/L   Total Bilirubin 1.3 (H) 0.3 - 1.2 mg/dL   Bilirubin, Direct 0.2 0.1 - 0.5 mg/dL   Indirect Bilirubin 1.1 (H) 0.3 - 0.9 mg/dL    Comment: Performed at [facility]  Prolactin     Status: Abnormal   Collection Time: 08/21/16  6:53 AM  Result Value Ref Range   Prolactin 27.2 (H) 4.8 - 23.3 ng/mL    Comment: (NOTE) Performed At: [facility] [place], [place] [id number] Lindon Romp MD RW:[id number] Performed at [facility]   Comprehensive metabolic panel     Status: Abnormal   Collection Time: 08/21/16  6:53 AM  Result Value Ref Range   Sodium 139 135 - 145 mmol/L   Potassium 4.0 3.5 - 5.1 mmol/L   Chloride 105 101 - 111 mmol/L   CO2 24 22 - 32 mmol/L   Glucose, Bld 83 65 - 99 mg/dL   BUN 16 6 - 20 mg/dL   Creatinine, Ser 0.87 0.44 - 1.00 mg/dL   Calcium 9.7 8.9 - 10.3 mg/dL   Total Protein 8.4 (H) 6.5 - 8.1 g/dL   Albumin 4.7 3.5 - 5.0 g/dL   AST 19 15 - 41 U/L   ALT 15 14 - 54 U/L   Alkaline Phosphatase 34 (L) 38 - 126 U/L   Total Bilirubin 1.5 (H) 0.3 - 1.2 mg/dL   GFR calc non Af Amer >60 >60 mL/min   GFR calc Af Amer >60 >60 mL/min    Comment: (NOTE) The eGFR has been calculated using the CKD EPI equation. This calculation has not been validated in all clinical situations. eGFR's persistently <60 mL/min signify possible Chronic Kidney Disease.    Anion gap 10 5 - 15    Comment: Performed at [facility]    Blood Alcohol level:  No results found for: [facility]  Metabolic Disorder Labs: No results found for: HGBA1C, MPG Lab Results  Component Value Date   PROLACTIN 27.2 (H) 08/21/2016   Lab Results  Component Value Date   CHOL 157 08/21/2016   TRIG 81 08/21/2016   HDL 75 08/21/2016    CHOLHDL 2.1 08/21/2016   VLDL 16 08/21/2016   LDLCALC 66 08/21/2016   Physical Findings: AIMS: Facial and Oral Movements Muscles of Facial Expression: None, normal Lips and Perioral Area: None, normal Jaw: None, normal Tongue: None, normal,Extremity Movements Upper (arms, wrists, hands, fingers): None, normal Lower (legs, knees, ankles, toes): None, normal, Trunk Movements Neck, shoulders, hips: None, normal, Overall Severity Severity of abnormal movements (highest score from questions above): None, normal Incapacitation due to abnormal movements: None, normal Patient's awareness of abnormal movements (rate only patient's report): No Awareness, Dental Status Current problems with teeth and/or dentures?: No Does patient usually wear dentures?: No  CIWA:    COWS:     Musculoskeletal: Strength & Muscle Tone: within normal limits Gait & Station: normal Patient leans: N/A  Psychiatric Specialty Exam: Physical Exam  Constitutional: She appears well-developed.  HENT:  Head: Normocephalic.  Eyes: Pupils are equal, round, and reactive to light.  Neck: Normal range of motion.  Cardiovascular: Normal rate.   Respiratory: Effort normal.  GI: Soft.  Genitourinary:  Genitourinary Comments: Deferred  Musculoskeletal: Normal range of motion.  Neurological: She is alert.    ROS: Reviewed Vital signs & nurses noted, stable.  Blood pressure 103/67, pulse 64, temperature 99.3 F (37.4 C), temperature source Oral, resp. rate 18, height 4' 11.75" (1.518 m), weight 45.6 kg (100 lb 8 oz), SpO2 100 %.Body mass index is 19.79 kg/m.  General Appearance: Well Groomed  Eye Contact:  Good  Speech:  Normal Rate  Volume:  Normal  Mood:  depressed, anxious, but reports feeling slightly better today  Affect:  mildly constricted   Thought Process:  Linear  Orientation:  Full (Time, Place, and Person)  Thought Content:  no hallucinations, no delusions , not internally preoccupied   Suicidal  Thoughts:  No denies any current suicidal or self injurious ideations   Homicidal Thoughts:  No denies any homicidal or violent ideations   Memory:  recent and remote grossly intact   Judgement:  Fair  Insight:  Fair  Psychomotor Activity:  Normal  Concentration:  Concentration: Good and Attention Span: Good  Recall:  Good  Fund of Knowledge:  Good  Language:  Good  Akathisia:  Negative  Handed:  Right  AIMS (if indicated):     Assets:  Desire for Improvement Resilience  ADL's:  Intact  Cognition:  WNL  Sleep: 6.75      Assessment: 20 year old single female, lives with parents. Had been enrolled in PHP x 1 week. She reports history of depression, anxiety and had been feeling worse recently due to which she had enrolled in PHP. States that yesterday she felt more intensely depressed, hopeless,  anxious, and "I started  feeling unsafe". States she had increased suicidal ideations, which although did not have any plan or intent, were " intense, and I felt scared" due to which she asked her mother to bring her to ED. States there was no clear trigger for increased depression yesterday.  Treatment plan/Summary: 1. Continue crisis management, mood stabilization & relapse prevention.. 2. Continue current medication management to reduce current symptoms to base line and improve the  patient's overall level of functioning; Fluoxetine 40 mg for depression, Focalin XR 10 mg for ADHD, Hydroxyzine 25 mg prn for anxiety. Will discontinue Mirtazapine 7.5 mg due to excessive drowsiness, initiate Trazodone 50 mg for insomnia. . 3. Treat health problems as indicated. 4. Develop treatment plan to enhance medication adeherance upon discharge & prevent the need for  readmission. 5. Psycho-social education regarding self care to maintain mood stability. 6. Will continue PRN treatment regimen per protocols. - Continue 15 minutes observation for safety concerns - Encouraged to participate in milieu therapy  and group therapy counseling sessions and also work with coping skills - Health care follow up as needed for medical problems. - Restart home medications where appropriate.  Lindell Spar I, NP, PMHNP-BC 08/22/2016, 12:15 PM

## 2016-08-22 NOTE — Progress Notes (Signed)
Patient remains irritable in mood, negative in thinking. "This place is so boring. You won't let us have anything. I mean, I understand it's protocol but we really want to do activities." Patient has been social with peers, working on Engineer, materialsjigsaw puzzle. Patient later observed on the phone with mother, raised voice, tearful. States mother told her that mother and her partial hospitalization program believe patient has an eating disorder. Patient angry, crying loudly and vehemently denies. "I want to leave and now I'm going to be stuck here because they think I don't eat. I don't like the food here. I ate 2 bowls of fruit (which was observed by this Clinical research associatewriter) at lunch." Patient offered support. Encouraged to go outside for rec time which patient did agree to do. Will continue to offer emotional support.

## 2016-08-23 DIAGNOSIS — F419 Anxiety disorder, unspecified: Secondary | ICD-10-CM

## 2016-08-23 DIAGNOSIS — F988 Other specified behavioral and emotional disorders with onset usually occurring in childhood and adolescence: Secondary | ICD-10-CM

## 2016-08-23 NOTE — Progress Notes (Signed)
Adult Psychoeducational Group Note  Date:  08/23/2016 Time:  9:20 PM  Group Topic/Focus:  Wrap-Up Group:   The focus of this group is to help patients review their daily goal of treatment and discuss progress on daily workbooks.   Participation Level:  Active  Participation Quality:  Appropriate  Affect:  Appropriate  Cognitive:  Alert  Insight: Appropriate  Engagement in Group:  Engaged  Modes of Intervention:  Discussion  Additional Comments:  Pt rated her day 8/10. She stated that she is bored with being here and ready to go. Her goal is to go to group therapy when she is discharged. Kaleen OdeaCOOKE, Hayden Mabin R 08/23/2016, 9:20 PM

## 2016-08-23 NOTE — Progress Notes (Signed)
[facility]  08/23/2016 12:19 PM Lucia EstelleKathryn M Victory  MRN:  [id number]  Subjective:  Reports to be doing better, mood improved. Remains to have constricted affect Objective: Samara DeistKathryn is a 20 year old Asian female with hx of major depression.  Alert and oriented, cooperative.tolerating meds Attention reasonable Depression improving Anxiety; not worsened Denies psychotic symptoms Remains somewhat vague about her initial presentation says its was having a bad day.  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 Suicidal ideation [R45.851] 08/20/2016  . MDD (major depressive disorder), recurrent severe, without psychosis ([facility]) [F33.2] 08/20/2016  . 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 Trapezius muscle spasm [M62.838] 07/24/2016  . ADD (attention deficit disorder) without hyperactivity [F98.8] 05/15/2016  . Anxiety [F41.9] 08/29/2011  . Severe recurrent major depression without psychotic features ([facility]) [F33.2] 08/29/2011   Total Time spent with patient: 25 minutes  Past Psychiatric History: ADHD  Past Medical History:  Past Medical History:  Diagnosis Date  . Allergy   . Anxiety   . Asthma   . Depression   . Seasonal allergies     Past Surgical History:  Procedure Laterality Date  . NO PAST SURGERIES     Family History:  Family History  Problem Relation Age of Onset  . Adopted: Yes  . Family history unknown: Yes   Family Psychiatric  History: See H&P  Social History:  History  Alcohol Use No     History  Drug Use  . Types: Marijuana    Comment: once or twice a month    Social History   Social History  . Marital status: Married    Spouse name: N/A  . Number of children: N/A  . Years of education: N/A   Social History Main Topics  .  Smoking status: Never Smoker  . Smokeless tobacco: Never Used  . Alcohol use No  . Drug use:     Types: Marijuana     Comment: once or twice a month  . Sexual activity: No   Other Topics Concern  . None   Social History Narrative  . None   Additional Social History:    Pain Medications: See PTA Prescriptions: See PTA Over the Counter: See PTA History of alcohol / drug use?: No history of alcohol / drug abuse  Sleep: Good  Appetite:  Good  Current Medications: Current Facility-Administered Medications  Medication Dose Route Frequency Provider Last Rate Last Dose  . acetaminophen (TYLENOL) tablet 650 mg  650 mg Oral Q6H PRN Laveda AbbeLaurie Britton Parks, NP      . alum & mag hydroxide-simeth (MAALOX/MYLANTA) 200-200-20 MG/5ML suspension 30 mL  30 mL Oral Q4H PRN Laveda AbbeLaurie Britton Parks, NP      . dexmethylphenidate (FOCALIN XR) 24 hr capsule 10 mg  10 mg Oral Daily Laveda AbbeLaurie Britton Parks, NP   10 mg at 08/23/16 1007  . FLUoxetine (PROZAC) capsule 40 mg  40 mg Oral Daily Craige CottaFernando A Cobos, MD   40 mg at 08/23/16 1007  . hydrOXYzine (ATARAX/VISTARIL) tablet 25 mg  25 mg Oral Q6H PRN Laveda AbbeLaurie Britton Parks, NP      . magnesium hydroxide (MILK OF MAGNESIA) suspension 30 mL  30 mL Oral Daily PRN Laveda AbbeLaurie Britton Parks, NP      . Darrol Angelorethin-Eth Estradiol-Fe [facility](FEMCON FE,WYMZYA  Cecille AmsterdamFE,ZENCHENT FE,ZEOSA) tablet 1 tablet  1 tablet Oral Daily Laveda AbbeLaurie Britton Parks, NP   1 tablet at 08/23/16 1007  . traZODone (DESYREL) tablet 50 mg  50 mg Oral QHS Sanjuana KavaAgnes I Nwoko, NP   50 mg at 08/22/16 2221   Lab Results:  No results found for this or any previous visit (from the past 48 hour(s)).  Blood Alcohol level:  [facility] found [facility]: [facility]  Metabolic Disorder Labs: No results found for: HGBA1C, MPG Lab Results  Component Value Date   PROLACTIN 27.2 (H) 08/21/2016   Lab Results  Component Value Date   CHOL 157 08/21/2016   TRIG 81 08/21/2016   HDL 75 08/21/2016   CHOLHDL 2.1 08/21/2016   VLDL 16 08/21/2016    LDLCALC 66 08/21/2016   Physical Findings: AIMS: Facial and Oral Movements Muscles of Facial Expression: None, normal Lips and Perioral Area: None, normal Jaw: None, normal Tongue: None, normal,Extremity Movements Upper (arms, wrists, hands, fingers): None, normal Lower (legs, knees, ankles, toes): None, normal, Trunk Movements Neck, shoulders, hips: None, normal, Overall Severity Severity of abnormal movements (highest score from questions above): None, normal Incapacitation due to abnormal movements: None, normal Patient's awareness of abnormal movements (rate only patient's report): No Awareness, Dental Status Current problems with teeth and/or dentures?: No Does patient usually wear dentures?: No  CIWA:    COWS:     Musculoskeletal: Strength & Muscle Tone: within normal limits Gait & Station: normal Patient leans: N/A  Psychiatric Specialty Exam: Physical Exam  HENT:  Head: Normocephalic and atraumatic.  Eyes: Pupils are equal, round, and reactive to light.  Neck: Normal range of motion.  Cardiovascular: Normal rate.   Respiratory: Effort normal.  GI: Soft.  Genitourinary:  Genitourinary Comments: Deferred  Musculoskeletal: Normal range of motion.  Neurological: She is alert.  Skin: She is not diaphoretic.    Review of Systems  Cardiovascular: Negative for chest pain.  Skin: Negative for rash.  Psychiatric/Behavioral: Positive for depression. Negative for suicidal ideas.  : Reviewed Vital signs & nurses noted, stable.  Blood pressure 117/69, pulse 75, temperature 98.7 F (37.1 C), resp. rate 16, height 4' 11.75" (1.518 m), weight 45.6 kg (100 lb 8 oz), SpO2 100 %.Body mass index is 19.79 kg/m.  General Appearance: Well Groomed  Eye Contact:  Good  Speech:  Normal Rate  Volume:  Normal  Mood:  less depressed  Affect:  mildly constricted   Thought Process:  Linear  Orientation:  Full (Time, Place, and Person)  Thought Content:  no hallucinations, no delusions  , not internally preoccupied   Suicidal Thoughts:  No denies any current suicidal or self injurious ideations   Homicidal Thoughts:  No denies any homicidal or violent ideations   Memory:  recent and remote grossly intact   Judgement:  Fair  Insight:  Fair  Psychomotor Activity:  Normal  Concentration:  Concentration: Good and Attention Span: Good  Recall:  Good  Fund of Knowledge:  Good  Language:  Good  Akathisia:  Negative  Handed:  Right  AIMS (if indicated):     Assets:  Desire for Improvement Resilience  ADL's:  Intact  Cognition:  WNL  Sleep: 6.75      Assessment: improving depression.   Treatment plan/Summary:  1. Depression; continue prozac. 2. A.DHD. Continue Folcalin 3. Anxiety: continue vistaril for breakthru anxiety Psycho education provided.  Continue current treatment plan Possible dishcarge by Monday if remains stable.    Thresa RossAKHTAR, Jhoselyn Ruffini, MD, PMHNP-BC

## 2016-08-23 NOTE — [facility] Group Notes (Signed)
Adult Group Therapy Note  Date:  08/23/2016  Time: 9:00AM-10:00AM  Group Topic/Focus: Today's group focused on the topic of healthy and unhealthy coping skills.  Commonalities were found in group members, including isolation, anger outbursts, internalizing problems, using marijuana or cocaine, stopping therapy in order to not have to feel things, smoking tobacco, bottling things up, and picking at skin/nails.  The reasons unhealthy coping skills are used were listed by group members, with a focus on them being fast, easy, and initially working.  The positive of making any decision to change were briefly listed.  Finally, we discussed how group members would go about implementing changes.  An emphasis was placed on being accountable to oneself as well as someone else when one is trying to change.  Participation Level:  Active  Participation Quality:  Resistant  Affect:  Irritable  Cognitive:  Alert  Insight: Improving  Engagement in Group:  Improving  Modes of Intervention:  Discussion and Support  Additional Comments:  The patient expressed one of her unhealthy coping skills is picking at her nails and one of her healthy coping skills is practicing mindfulness..  Someone to hold her accountable is herself.  She was somewhat insistent that she knows all the coping skills because she has been attending Partial Hospitalization Program.  She rolled her eyes a lot throughout group, but was willing to engage with patients.  Carloyn JaegerMareida J Grossman-Orr 08/23/2016 , 1:03 PM

## 2016-08-23 NOTE — Progress Notes (Signed)
D:  Per pt self inventory pt reports sleeping fair, appetite good, energy level normal, ability to pay attention good, rates depression at a 2 out of 10, hopelessness at a 1 out of 10, anxiety at a 1 out of 10, denies SI/HI/AVH, goal today: "being positive/focusing on myself, not complain", flat during interaction, pleasant and cooperative.     A:  Emotional support provided, Encouraged pt to continue with treatment plan and attend all group activities, q15 min checks maintained for safety.  R:  Pt is receptive, going to groups, pleasant and cooperative with staff and other patients on the unit.

## 2016-08-24 MED ORDER — FLUOXETINE HCL 40 MG PO CAPS: 40.0000 mg | ORAL_CAPSULE | Freq: Every day | ORAL | 0 refills | Status: DC

## 2016-08-24 MED ORDER — TRAZODONE HCL 50 MG PO TABS: 50.0000 mg | ORAL_TABLET | Freq: Every day | ORAL | 0 refills | Status: DC

## 2016-08-24 NOTE — Discharge Summary (Signed)
Physician Discharge Summary Note  Patient:  Rebekah Tate is an 20 y.o., female MRN:  [id number] DOB:  04-Feb-1996 Patient phone:  [phone] (home)  Patient address:   [address] [place] [id number],  Total Time spent with patient: 30 minutes  Date of Admission:  08/20/2016 Date of Discharge: 08/24/2016  Reason for Admission:PERH&P  20 year old single female, lives with parents. Had been enrolled in PHP x 1 week. She reports history of depression, anxiety and had been feeling worse recently due to which she had enrolled in PHP. States that yesterday she felt more intensely depressed, hopeless,  anxious, and "I started  feeling unsafe". States she had increased suicidal ideations, which although did not have any plan or intent, were " intense, and I felt scared" due to which she asked her mother to bring her to ED. States there was no clear trigger for increased depression yesterday. She describes a history of chronic depression starting in adolescence, which has been  intermittent . In addition to depression as related above, she describes significant and chronic anxiety, which she describes as worrying excessively .  Principal Problem: Severe recurrent major depression without psychotic features [facility](HCC) Discharge Diagnoses: Patient Active Problem List   Diagnosis Date Noted  . Suicidal ideation [R45.851] 08/20/2016  . MDD (major depressive disorder), recurrent severe, without psychosis (HCC) [F33.2] 08/20/2016  . 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 Trapezius muscle spasm [M62.838] 07/24/2016  . ADD (attention deficit disorder) without hyperactivity [F98.8] 05/15/2016  . Anxiety [F41.9] 08/29/2011  . Severe recurrent major depression without psychotic features (HCC) [F33.2] 08/29/2011    Past Psychiatric History:   Past Medical History:  Past Medical History:  Diagnosis Date  . Allergy   . Anxiety   . Asthma   . Depression   . Seasonal allergies      Past Surgical History:  Procedure Laterality Date  . NO PAST SURGERIES     Family History:  Family History  Problem Relation Age of Onset  . Adopted: Yes  . Family history unknown: Yes   Family Psychiatric  History:  Social History:  History  Alcohol Use No     History  Drug Use  . Types: Marijuana    Comment: once or twice a month    Social History   Social History  . Marital status: Married    Spouse name: N/A  . Number of children: N/A  . Years of education: N/A   Social History Main Topics  . Smoking status: Never Smoker  . Smokeless tobacco: Never Used  . Alcohol use No  . Drug use:     Types: Marijuana     Comment: once or twice a month  . Sexual activity: No   Other Topics Concern  . None   Social History Narrative  . None    Hospital Course:  Rebekah Tate was admitted for Severe recurrent major depression without psychotic features [facility](HCC)  and crisis management.  Pt was treated discharged with the medications listed below under Medication List.  Medical problems were identified and treated as needed.  Home medications were restarted as appropriate.  Improvement was monitored by observation and Rebekah Tate 's daily report of symptom reduction.  Emotional and mental status was monitored by daily self-inventory reports completed by Rebekah Tate and clinical staff.         Rebekah Tate was evaluated by the treatment team for stability and plans for continued  recovery upon discharge. Rebekah Tate 's motivation was an integral factor for scheduling further treatment. Employment, transportation, bed availability, health status, family support, and any pending legal issues were also considered during hospital stay. Pt was offered further treatment options upon discharge including but not limited to Residential, Intensive Outpatient, and Outpatient treatment.  Rebekah Tate will follow up with the services as listed below under Follow Up  Information.     Upon completion of this admission the patient was both mentally and medically stable for discharge denying suicidal/homicidal ideation, auditory/visual/tactile hallucinations, delusional thoughts and paranoia.      Rebekah Tate responded well to treatment with Prozac 40mg , and trazodone 50mg  without adverse effects. Pt demonstrated improvement without reported or observed adverse effects to the point of stability appropriate for outpatient management. Pertinent labs include: Cmp and  Prolactin 27.2 (high), for which outpatient follow-up is necessary for lab recheck as mentioned below. Reviewed CBC, CMP, BAL, and UDS; all unremarkable aside from noted exceptions.   Physical Findings: AIMS: Facial and Oral Movements Muscles of Facial Expression: None, normal Lips and Perioral Area: None, normal Jaw: None, normal Tongue: None, normal,Extremity Movements Upper (arms, wrists, hands, fingers): None, normal Lower (legs, knees, ankles, toes): None, normal, Trunk Movements Neck, shoulders, hips: None, normal, Overall Severity Severity of abnormal movements (highest score from questions above): None, normal Incapacitation due to abnormal movements: None, normal Patient's awareness of abnormal movements (rate only patient's report): No Awareness, Dental Status Current problems with teeth and/or dentures?: No Does patient usually wear dentures?: No  CIWA:    COWS:     Musculoskeletal: Strength & Muscle Tone: within normal limits Gait & Station: normal Patient leans: N/A  Psychiatric Specialty Exam: Physical Exam  Nursing note and vitals reviewed. Constitutional: She is oriented to person, place, and time.  Neurological: She is alert and oriented to person, place, and time.  Psychiatric: She has a normal mood and affect. Her behavior is normal.    Review of Systems  Psychiatric/Behavioral: Negative for suicidal ideas. Depression: stable. The patient is not  nervous/anxious.     Blood pressure 117/69, pulse (!) 56, temperature 98.7 F (37.1 C), temperature source Oral, resp. rate 16, height 4' 11.75" (1.518 m), weight 45.6 kg (100 lb 8 oz), SpO2 100 %.Body mass index is 19.79 kg/m.   Have you used any form of tobacco in the last 30 days? (Cigarettes, Smokeless Tobacco, Cigars, and/or Pipes): No  Has this patient used any form of tobacco in the last 30 days? (Cigarettes, Smokeless Tobacco, Cigars, and/or Pipes), No  Blood Alcohol level:  No results found for: [facility]  Metabolic Disorder Labs:  No results found for: HGBA1C, MPG Lab Results  Component Value Date   PROLACTIN 27.2 (H) 08/21/2016   Lab Results  Component Value Date   CHOL 157 08/21/2016   TRIG 81 08/21/2016   HDL 75 08/21/2016   CHOLHDL 2.1 08/21/2016   VLDL 16 08/21/2016   LDLCALC 66 08/21/2016    See Psychiatric Specialty Exam and Suicide Risk Assessment completed by Attending Physician prior to discharge.  Discharge destination:  Home  Is patient on multiple antipsychotic therapies at discharge:  No   Has Patient had three or more failed trials of antipsychotic monotherapy by history:  No  Recommended Plan for Multiple Antipsychotic Therapies: NA  Discharge Instructions    Diet - low sodium heart healthy    Complete by:  As directed    Discharge instructions    Complete  by:  As directed    Take all medications as prescribed. Keep all follow-up appointments as scheduled.  Do not consume alcohol or use illegal drugs while on prescription medications. Report any adverse effects from your medications to your primary care provider promptly.  In the event of recurrent symptoms or worsening symptoms, call 911, a crisis hotline, or go to the nearest emergency department for evaluation.   Increase activity slowly    Complete by:  As directed        Medication List    STOP taking these medications   meloxicam 7.5 MG tablet Commonly known as:  MOBIC     TAKE  these medications     Indication  dexmethylphenidate 10 MG 24 hr capsule Commonly known as:  FOCALIN XR Take 1 capsule (10 mg total) by mouth daily.  Indication:  Attention Deficit Hyperactivity Disorder   FLUoxetine 40 MG capsule Commonly known as:  PROZAC Take 1 capsule (40 mg total) by mouth daily. Start taking on:  08/25/2016 What changed:  medication strength  how much to take  Indication:  Major Depressive Disorder   Norethindrone Acetate-Ethinyl Estrad-FE 1-20 MG-MCG(24) tablet Commonly known as:  LOESTRIN 24 FE Take 1 tablet by mouth daily.  Indication:  Birth Control   PROVENTIL HFA 108 [phone] Base) MCG/ACT inhaler Generic drug:  albuterol Inhale 2 puffs into the lungs daily as needed for wheezing or shortness of breath.  Indication:  Asthma   traZODone 50 MG tablet Commonly known as:  DESYREL Take 1 tablet (50 mg total) by mouth at bedtime. What changed:  when to take this  reasons to take this  Indication:  Trouble Sleeping      Follow-up Information    BEHAVIORAL HEALTH PARTIAL HOSPITALIZATION PROGRAM Follow up.   Specialty:  Behavioral Health Why:  You may resume participation in this program the next business day following discharge. Contact information: [address] [id number] mc [place] [address] [phone]          Follow-up recommendations:  Activity:  as tolerated Diet:  heart healthy  Comments: Take all medications as prescribed. Keep all follow-up appointments as scheduled.  Do not consume alcohol or use illegal drugs while on prescription medications. Report any adverse effects from your medications to your primary care provider promptly.  In the event of recurrent symptoms or worsening symptoms, call 911, a crisis hotline, or go to the nearest emergency department for evaluation.   Signed: Oneta Rack, NP 08/24/2016, 11:02 AM  I have examined the patient and agree with the discharge plan and  findings. I have also done suicide assessment on this patient.

## 2016-08-24 NOTE — Progress Notes (Deleted)
  [facility] Dis[facility] :  Will you be returning to the same living situation after discharge:  Yes,  with parents At discharge, do you have transportation home?: Yes,  family Do you have the ability to pay for your medications: Yes,  no issues  Release of information consent forms completed and in the chart;  Patient's signature needed at discharge.  Patient to Follow up at: Follow-up Information    BEHAVIORAL HEALTH PARTIAL HOSPITALIZATION PROGRAM Follow up.   Specialty:  Behavioral Health Why:  You may resume participation in this program the next business day following discharge. Contact information: [address] [id number] mc [place] [id number] [phone]          Next level of care provider has access to [facility] Link:yes  Safety Planning and Suicide Prevention discussed: Yes,  with patient and mother  Have you used any form of tobacco in the last 30 days? (Cigarettes, Smokeless Tobacco, Cigars, and/or Pipes): No  Has patient been referred to the Quitline?: N/A patient is not a smoker  Patient has been referred for addiction treatment: N/A  Lynnell ChadMareida J Grossman-Orr 08/24/2016, 1:07 PM

## 2016-08-24 NOTE — Progress Notes (Addendum)
  [facility] :  Will you be returning to the same living situation after discharge:  Yes,  with parents At discharge, do you have transportation home?: Yes,  father Do you have the ability to pay for your medications: Yes,  has insurance, income  Release of information consent forms completed and in the chart;  Patient's signature needed at discharge.  Patient to Follow up at: Follow-up Information    BEHAVIORAL HEALTH PARTIAL HOSPITALIZATION PROGRAM Follow up.   Specialty:  Behavioral Health Why:  You may resume this program on 12/4. Contact information: [address] [id number] mc [place] [id number] [phone]       Nelly RoutKUMAR,ARCHANA, MD Follow up.   Specialty:  Psychiatry Why:  Go to your next scheduled appointment with your psychiatrist at the new address, [address], [address], [place] Contact information: [address] [place] [id number] [phone]        [name],your therapist Follow up.   Why:  Resume treatment. Contact information: [facility] [address] [place]  [id number] [phone]          Next level of care provider has access to [facility]:yes  Safety Planning and Suicide Prevention discussed: Yes,  with patient, with mother per handoff, not yet documented  Have you used any form of tobacco in the last 30 days? (Cigarettes, Smokeless Tobacco, Cigars, and/or Pipes): No  Has patient been referred to the Quitline?: N/A patient is not a smoker  Patient has been referred for addiction treatment: N/A  Rebekah ChadMareida J Tate 08/24/2016, 1:40 PM

## 2016-08-24 NOTE — Progress Notes (Signed)
Writer spoke with Rebekah Tate 1:1 and she reports feeling much better and is ready to go home now. She plans to continue to attend partial hospitalization once discharged. She has been up and active in the dayroom, attended group and compliant with her medication.  She denies si/hi/a/v hallucinations. She reports that she is trying to make the best of a bad situation and just wait to be discharged. Support given and safety maintained on unit with 15 min checks.

## 2016-08-24 NOTE — BHH Group Notes (Signed)
Adult Therapy Group Note  Date:  08/24/2016  Time:  9:00-10:00AM  Group Topic/Focus: Fears and Healthy/Unhealthy Coping Skills  Building Self Esteem:   The Focus of this group was to discuss some of the prevalent fears that patients experience, and to identify the commonalities among group members.  An exercise was used to initiate the discussion, followed by writing on the white board a group-generated list of unhealthy coping and healthy coping techniques to deal with each fear, as well as supports that could help in using healthy coping.  This included a variety of supports, and CSW emphasized professional supports such as therapist, support groups and psychiatrist.  At the end of group, a song was played that group members danced to, at their request.  Participation Level:  Active  Participation Quality:  Appropriate  Affect:  Flat and Irritable  Cognitive:  Alert  Insight: Good  Engagement in Group:  Engaged  Modes of Intervention:  Discussion, Exploration and Support  Additional Comments:  The patient expressed that one thing people would not know about her by looking at her is that she was adopted.  She was sitting next to a patient with limited understanding who kept going off topic, and she kept rolling her eyes and demonstrating her frustration.  She made helpful contributions to the discussion.  Ambrose MantleMareida Grossman-Orr, LCSW 08/24/2016   12:35pm

## 2016-08-24 NOTE — BHH Suicide Risk Assessment (Signed)
[facility]   Principal Problem: Severe recurrent major depression without psychotic features [facility](HCC) Discharge Diagnoses:  Patient Active Problem List   Diagnosis Date Noted  . Suicidal ideation [R45.851] 08/20/2016  . MDD (major depressive disorder), recurrent severe, without psychosis (HCC) [F33.2] 08/20/2016  . 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 Trapezius muscle spasm [M62.838] 07/24/2016  . ADD (attention deficit disorder) without hyperactivity [F98.8] 05/15/2016  . Anxiety [F41.9] 08/29/2011  . Severe recurrent major depression without psychotic features (HCC) [F33.2] 08/29/2011    Total Time spent with patient: 30 minutes  Musculoskeletal: Strength & Muscle Tone: within normal limits Gait & Station: normal Patient leans: no lean  Psychiatric Specialty Exam: Review of Systems  Cardiovascular: Negative for chest pain and palpitations.  Gastrointestinal: Negative for nausea.  Skin: Negative for rash.  Psychiatric/Behavioral: Negative for depression and suicidal ideas.    Blood pressure 117/69, pulse (!) 56, temperature 98.7 F (37.1 C), temperature source Oral, resp. rate 16, height 4' 11.75" (1.518 m), weight 45.6 kg (100 lb 8 oz), SpO2 100 %.Body mass index is 19.79 kg/m.  General Appearance: Casual  Eye Contact::  Fair  Speech:  Normal Rate409  Volume:  Normal  Mood:  Euthymic  Affect:  Constricted  Thought Process:  Goal Directed  Orientation:  Full (Time, Place, and Person)  Thought Content:  Rumination  Suicidal Thoughts:  No  Homicidal Thoughts:  No  Memory:  Immediate;   Fair Recent;   Good  Judgement:  Fair  Insight:  fair  Psychomotor Activity:  Normal  Concentration:  Fair  Recall:  [employer]Fair  Fund of Knowledge:Fair  Language: Fair  Akathisia:  Negative  Handed:  Right  AIMS (if indicated):     Assets:  Desire for Improvement Social Support  Sleep:  Number of Hours: 6.5  Cognition: WNL  ADL's:   Intact   Mental Status Per Nursing Assessment::   On Admission:  Suicidal ideation indicated by patient  Demographic Factors:  Adolescent or young adult  Loss Factors: family and school stress  Historical Factors: Impulsivity  Risk Reduction Factors:   Sense of responsibility to family, Living with another person, especially a relative and Positive coping skills or problem solving skills  Continued Clinical Symptoms:  Previous Psychiatric Diagnoses and Treatments  Cognitive Features That Contribute To Risk:  None    Suicide Risk:  Minimal: No identifiable suicidal ideation.  Patients presenting with no risk factors but with morbid ruminations; may be classified as minimal risk based on the severity of the depressive symptoms  Follow-up Information    BEHAVIORAL HEALTH PARTIAL HOSPITALIZATION PROGRAM Follow up.   Specialty:  Behavioral Health Why:  You may resume participation in this program the next business day following discharge. Contact information: [address] [id number] [place] [phone]        See discharge summary for details Patient continues to improve and can be discharged Follow up with referrals and appointments for continuing  stability  Plan Of Care/Follow-up recommendations:  Activity:  as tolerated Diet:  regular  Thresa RossAKHTAR, Kenisha Lynds, MD 08/24/2016, 11:45 AM

## 2016-08-24 NOTE — Progress Notes (Signed)
D Pt is readied for discharge as she completes her daily assessment this am. On this, she writes she denies SI this am and she rates her depression, hopelessness and anxiety " 1/0/2", respectively. A SHe is bright and smiling and chipper upon approach this am, stating " I have a place to go and I already have a doctor and a therapist and I'm hoping the doctor will let me be discharged". She attends her groups and is able to identify the ways she measures that she is getting better. She says: "  I see that I can change talking down to myself and I see that I also have a lot of growth to make in the future...but I feel much better". After her discharged is finalized, the dc paperwork is reviewed with her by this Clinical research associatewriter. The AVS, MD SRA, transition form and a cc of the patient-created suicide safety plan is all reviewed with her and she is given these documents to take with her. Additionally prescriptions for her meds, along with a patient satisfaction survey is given to her. Writer explained to her that Social Work will contact her Monday am to clarify / validate her possible reentry into the partial hospitalization program. ALl belongings in her locker are returned to her, pt states she " understands" dc follow up plans and she is escorted to bldg entrance and dc'd to care of her father.

## 2016-08-25 ENCOUNTER — Other Ambulatory Visit (HOSPITAL_COMMUNITY): Payer: 59 | Attending: Psychiatry | Admitting: Licensed Clinical Social Worker

## 2016-08-25 DIAGNOSIS — F322 Major depressive disorder, single episode, severe without psychotic features: Secondary | ICD-10-CM | POA: Insufficient documentation

## 2016-08-25 DIAGNOSIS — F411 Generalized anxiety disorder: Secondary | ICD-10-CM

## 2016-08-25 DIAGNOSIS — F332 Major depressive disorder, recurrent severe without psychotic features: Secondary | ICD-10-CM

## 2016-08-25 NOTE — Psych (Signed)
   [facility] THERAPIST PROGRESS [facility]  Rebekah Tate [id number]  Session Time: 9 AM - 2 PM  Participation Level: Active  Behavioral Response: CasualAlertEuthymic  Type of Therapy: Group Therapy  Treatment Goals addressed: Coping  Interventions: CBT, DBT, Supportive and Reframing  Summary: Clinician facilitated check-in regarding current stressors and situation, and review of patient completed diary card. Clinician utilized active listening and empathetic response and validated patient emotions. Clinician facilitated discussion on honesty, handling conflict, and the weekend. Clinician introduced topic of distress tolerance and educated patients on STOP, TIPP, ACCEPTS, and self soothe skills. Group created a virtual coping skills box to utilize outside of group. Clinician assessed for immediate needs, medication compliance and efficacy, and safety concerns.    Suicidal/Homicidal: Nowithout intent/plan  Therapist Response: Rebekah EstelleKathryn M Standen is a 20 y.o. female who presents with depression symptoms. Patient arrived within time allowed and reports she is feeling "really good." Patient rates her mood at a 9 on a 1- 10 scale with 10 being great. Patient reports feeling more positive and capable since discharging from the hospital. Patient engaged in activity and discussion.  Patient participated in distress tolerance activity and reports understanding of the skills and how to use them. Patient identified ways she could tailor the skills to her life. Patient demonstrates some progress as evidenced by increased socialization in group, brighter affect and mood, increased engagement. Patient denies SI/HI/self-harm thoughts.    Plan: Patient will continue in PHP and medication management. Work towards decreasing depression symptoms and increase emotional regulation and positive coping skills.    Diagnosis: Severe recurrent major depression without psychotic features (HCC) [F33.2]    1. Severe  recurrent major depression without psychotic features (HCC)   2. GAD (generalized anxiety disorder)       Donia GuilesJenny Zaylia Riolo, LCSW 08/25/2016

## 2016-08-25 NOTE — Psych (Signed)
Comprehensive Clinical Assessment (CCA) Note  08/25/2016 Rebekah Tate [id number]  Visit Diagnosis:      ICD-9-CM ICD-10-CM   1. Severe recurrent major depression without psychotic features (HCC) 296.33 F33.2   2. GAD (generalized anxiety disorder) 300.02 F41.1       CCA Part One  Part One has been completed on paper by the patient.  (See scanned document in Chart Review)  CCA Part Two A  Intake/Chief Complaint:  CCA Intake With Chief Complaint CCA Part Two Date: 08/25/16 CCA Part Two Time: 1000 Chief Complaint/Presenting Problem: Pt presents as step-down from inpatient treatment for SI. Pt was involved in PHP, which was interrupted by hospital stay, and now returns to continue treatment. Pt reports she "realizes I wasn't really wanting to get better before, but now I know I can change things and I want to." Pt denies current SI/HI.  Patients Currently Reported Symptoms/Problems: Pt reports continued depressed mood,  anhedonia, low motivation, emotional numbness, critical self talk,  constant worry, preoccupation with weight/calorie intake, struggle with interpersonal issues and feeling as if no one likes her. Pt denies plan or intent for SI/HI.   Individual's Strengths: Pt reports strong support in her parents and safe home environment. Pt reports new vigor for treatment.   Mental Health Symptoms Depression:  Depression: Change in energy/activity, Fatigue, Hopelessness, Sleep (too much or little), Worthlessness  Mania:     Anxiety:   Anxiety: Restlessness, Sleep, Tension, Worrying, Fatigue  Psychosis:     Trauma:     Obsessions:     Compulsions:     Inattention:     Hyperactivity/Impulsivity:     Oppositional/Defiant Behaviors:     Borderline Personality:  Emotional Irregularity: Mood lability  Other Mood/Personality Symptoms:      Mental Status Exam Appearance and self-care  Stature:  Stature: Average  Weight:  Weight: Thin  Clothing:  Clothing: Casual  Grooming:   Grooming: Normal  Cosmetic use:  Cosmetic Use: Age appropriate  Posture/gait:  Posture/Gait: Normal  Motor activity:  Motor Activity: Not Remarkable  Sensorium  Attention:  Attention: Normal  Concentration:  Concentration: Normal  Orientation:  Orientation: X5  Recall/memory:  Recall/Memory: Normal  Affect and Mood  Affect:  Affect: Depressed  Mood:  Mood: Depressed  Relating  Eye contact:  Eye Contact: Normal  Facial expression:  Facial Expression: Depressed  Attitude toward examiner:  Attitude Toward Examiner: Cooperative  Thought and Language  Speech flow: Speech Flow: Normal  Thought content:  Thought Content: Appropriate to mood and circumstances  Preoccupation:     Hallucinations:     Organization:     Company secretaryxecutive Functions  Fund of Knowledge:  Fund of Knowledge: Average  Intelligence:  Intelligence: [employer]  Abstraction:  Abstraction: Normal  Judgement:  Judgement: Fair  Dance movement psychotherapisteality Testing:  Reality Testing: Adequate  Insight:  Insight: Fair  Decision Making:  Decision Making: Normal  Social Functioning  Social Maturity:     Social Judgement:     Stress  Stressors:  Stressors: Transitions  Coping Ability:  Coping Ability: Horticulturist, commercialxhausted  Skill Deficits:     Supports:      Family and Psychosocial History: Family history Marital status: Single  Childhood History:  Childhood History By whom was/is the patient raised?: Adoptive parents (Pt reports she was raised by both adoptive parents who she has been with since age 62. ) Patient's description of current relationship with people who raised him/her: Pt reports a positive relationship with her parents and that they  are supportive.  Does patient have siblings?: No Did patient suffer any verbal/emotional/physical/sexual abuse as a child?: No Did patient suffer from severe childhood neglect?: No Has patient ever been sexually abused/assaulted/raped as an adolescent or adult?: No Was the patient ever a victim of a crime  or a disaster?: No Witnessed domestic violence?: No Has patient been effected by domestic violence as an adult?: No  CCA Part Two B  Employment/Work Situation: Employment / Work Psychologist, occupational Employment situation: Employed Where is patient currently employed?: [employer]; [employer] How long has patient been employed?: since May Patient's job has been impacted by current illness: Yes Describe how patient's job has been impacted: Pt has taken a break from [employer] to accomodate treatment Has patient ever been in the [employer]?: No Are There Guns or Other Weapons in Your Home?: No  Education: Education Last Grade Completed: 14 Did Garment/textile technologist From [employer]?: Yes Did Theme park manager?: Yes (Pt reports she attended [employer] for 2 years and is taking a gap year and plans to transfer)  Religion:    Leisure/Recreation: Leisure / Recreation Leisure and Hobbies: 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 ) Have You Gained or Lost A Significant Amount of Weight in the Past Six Months?: No Do You Follow a Special Diet?:  (Pt reports she is limited in what she eats because she worries about calories.  Pt reports some restrictive intake) Do You Have Any Trouble Sleeping?: Yes Explanation of Sleeping Difficulties: Pt states "normal" is 3-5 hours for her. Pt reports improvement since beginning a new sleep medication.  CCA Part Two C  Alcohol/Drug Use: Alcohol / Drug Use Pain Medications: Pt denies Prescriptions: Focalin; Trazodone; Prozac; Albuterol Over the Counter: Pt denies History of alcohol / drug use?: No history of alcohol / drug abuse     CCA Part Three  ASAM's:  Six Dimensions of Multidimensional Assessment  Dimension 1:  Acute  Intoxication and/or Withdrawal Potential:     Dimension 2:  Biomedical Conditions and Complications:     Dimension 3:  Emotional, Behavioral, or Cognitive Conditions and Complications:     Dimension 4:  Readiness to Change:     Dimension 5:  Relapse, Continued use, or Continued Problem Potential:     Dimension 6:  Recovery/Living Environment:      Substance use Disorder (SUD)  None Reported  Stress:  Stress Stressors: Transitions Coping Ability: Exhausted Patient Takes Medications The Way The Doctor Instructed?: Yes Priority Risk: Moderate Risk  Risk Assessment- Self-Harm Potential: Risk Assessment For Self-Harm Potential Thoughts of Self-Harm: No current thoughts Method: No plan Additional Information for Self-Harm Potential: Acts of Self-harm (Recent hospitalization for SI)  Risk Assessment -Dangerous to Others Potential: Risk Assessment For Dangerous to Others Potential Method: No Plan  DSM5 Diagnoses: Patient Active Problem List   Diagnosis Date Noted  . Suicidal ideation 08/20/2016  . MDD (major depressive disorder), recurrent severe, without psychosis (HCC) 08/20/2016  . Tendinopathy of left gluteus medius 07/24/2016  . Trapezius muscle spasm 07/24/2016  . ADD (attention deficit disorder) without hyperactivity 05/15/2016  . Anxiety 08/29/2011  . Severe recurrent major depression without psychotic features (HCC) 08/29/2011    Patient Centered Plan: Patient is on the following Treatment Plan(s):  Depression  Recommendations for Services/Supports/Treatments: Recommendations  for Services/Supports/Treatments Recommendations For Services/Supports/Treatments: Partial Hospitalization (Pt will re-engage in PHP to increase stability and long term progress )  Treatment Plan Summary:  Pt states: "I want to like who I am and know I can manage what is going to come."   Referrals to Alternative Service(s): Referred to Alternative Service(s):   Place:   Date:   Time:     Referred to Alternative Service(s):   Place:   Date:   Time:    Referred to Alternative Service(s):   Place:   Date:   Time:    Referred to Alternative Service(s):   Place:   Date:   Time:     Donia GuilesJenny Remmy Riffe, MSW, LCSW, LCAS

## 2016-08-25 NOTE — [facility] Suicide Risk Assessment (Signed)
[facility] INPATIENT:  Family/Significant Other Suicide Prevention Education  Suicide Prevention Education:  Education Completed; Rebekah CaroliKathryn Tate, Pt's mother [phone], has been identified by the patient as the family member/significant other with whom the patient will be residing, and identified as the person(s) who will aid the patient in the event of a mental health crisis (suicidal ideations/suicide attempt).  With written consent from the patient, the family member/significant other has been provided the following suicide prevention education, prior to the and/or following the discharge of the patient.  The suicide prevention education provided includes the following:  Suicide risk factors  Suicide prevention and interventions  National Suicide Hotline telephone number  [facility] assessment telephone number  [facility] Emergency Assistance 911  [facility] and/or [facility]idential Mobile Crisis Unit telephone number  Request made of family/significant other to:  Remove weapons (e.g., guns, rifles, knives), all items previously/currently identified as safety concern.    Remove drugs/medications (over-the-counter, prescriptions, illicit drugs), all items previously/currently identified as a safety concern.  The family member/significant other verbalizes understanding of the suicide prevention education information provided.  The family member/significant other agrees to remove the items of safety concern listed above.  Rebekah Tate 08/25/2016, 8:24 AM

## 2016-08-26 ENCOUNTER — Ambulatory Visit (HOSPITAL_COMMUNITY): Payer: Self-pay

## 2016-08-26 ENCOUNTER — Other Ambulatory Visit (HOSPITAL_COMMUNITY): Payer: 59 | Admitting: Licensed Clinical Social Worker

## 2016-08-26 ENCOUNTER — Other Ambulatory Visit (HOSPITAL_COMMUNITY): Payer: 59 | Admitting: Occupational Therapy

## 2016-08-26 ENCOUNTER — Encounter (HOSPITAL_COMMUNITY): Payer: Self-pay | Admitting: Occupational Therapy

## 2016-08-26 DIAGNOSIS — F332 Major depressive disorder, recurrent severe without psychotic features: Secondary | ICD-10-CM

## 2016-08-26 DIAGNOSIS — F411 Generalized anxiety disorder: Secondary | ICD-10-CM

## 2016-08-26 DIAGNOSIS — F322 Major depressive disorder, single episode, severe without psychotic features: Secondary | ICD-10-CM | POA: Diagnosis not present

## 2016-08-26 NOTE — Therapy (Signed)
[facility] [address] [place], [place], [address] Phone: [phone]   Fax:  [phone]  Occupational Therapy Treatment  Patient Details  Name: Rebekah Tate MRN: [id number] Date of Birth: 11/10/1995 No Data Recorded  Encounter Date: 08/26/2016      OT End of Session - 08/26/16 1624    Visit Number 3   Number of Visits 6   Date for OT Re-Evaluation 09/11/16   Authorization Type UMR   OT Start Time 1030   OT Stop Time 1130   OT Time Calculation (min) 60 min   Activity Tolerance Patient tolerated treatment well   Behavior During Therapy [facility] for tasks assessed/performed      Past Medical History:  Diagnosis Date  . Allergy   . Anxiety   . Asthma   . Depression   . Seasonal allergies     Past Surgical History:  Procedure Laterality Date  . NO PAST SURGERIES      There were no vitals filed for this visit.      Subjective Assessment - 08/26/16 1624    Currently in Pain? No/denies        OT Group: Job Readiness   S:  "I want to be either an early childhood development expert or a special education teacher." O:  Patient actively participated in the following skilled occupational therapy treatment session this date:             Job readiness-Discussed the skills necessary for job readiness including hard and soft   Skills. Discussed the difference in each skill set, [name] participated in group discussion of what constitutes each skill and why they are important. She completed the My Ideal Job worksheet and shared findings with the group. She also completed the Draw My Wall worksheet identifying various elements as preventing her from achieving a job or dream. OT, [name], and group discussed obstacles to goal achievement including health, schooling, and financial barriers.  A:  Patient participated in skilled occupational therapy group for job readiness this date.  Patient was engaged and open to discussion  and strategies introduced. Pt provided with The Do's and Don'ts of Keeping a Job handout and the [employer] Job Readiness Skills Outline. [name] completed homework contract sheet identifying her current strength for job readiness, what she wants to improve, and a goal for achieving her future career.   P:  Continue participation in skilled occupational therapy groups  1-2 times per week for 2 weeks in order to gain the necessary skills needed to return to full time community living and learn effective coping strategies to be a productive community resident. Follow up on HEP for job readiness.         OT Short Term Goals - 08/19/16 1239      OT SHORT TERM GOAL #1   Title Patient will be educated on strategies to improve psychosocial skills needed to participate fully in all daily, work, and leisure activities.   Time 3   Period Weeks   Status On-going     OT SHORT TERM GOAL #2   Title Patient will be educated on a HEP and independent with implementation of HEP.3   Time 3   Period Weeks   Status On-going     OT SHORT TERM GOAL #3   Title Patient will independently apply psychosocial skills and coping mechanisms to her daily activties in order to function independently   Time 3   Period Weeks  Status On-going                  Plan - 08/26/16 1625    Rehab Potential Good   OT Frequency --  1-2x/week   OT Duration --  3 weeks   OT Treatment/Interventions Self-care/ADL training  coping skills mechanism training, psychosocial skill development, community reintegration   Consulted and Agree with Plan of Care Patient      Patient will benefit from skilled therapeutic intervention in order to improve the following deficits and impairments:   (decreased coping skills, decreased psychosocial skills)  Visit Diagnosis: Severe recurrent major depression without psychotic features (HCC)  GAD (generalized anxiety disorder)    Problem List Patient Active Problem List    Diagnosis Date Noted  . Suicidal ideation 08/20/2016  . MDD (major depressive disorder), recurrent severe, without psychosis (HCC) 08/20/2016  . Tendinopathy of left gluteus medius 07/24/2016  . Trapezius muscle spasm 07/24/2016  . ADD (attention deficit disorder) without hyperactivity 05/15/2016  . Anxiety 08/29/2011  . Severe recurrent major depression without psychotic features (HCC) 08/29/2011   Ezra SitesLeslie Troxler, OTR/L  [phone] 08/26/2016, 4:25 PM  [facility] [address] [place], [place], [address] Phone: [phone]   Fax:  [phone]  Name: Rebekah Tate MRN: [id number] Date of Birth: 1995/11/24

## 2016-08-26 NOTE — Progress Notes (Signed)
Transfer note from inpatient to return to partial hospital program  Ms Rebekah Tate had suicidal ideation which [facility].  She was not thrilled with the treatment there but said it was an eye opener to her in that she realized she is going to have the depression/snxiety and is going to have to deal with it.  She no longer has suicidal ideation and wants to  Live she says.  Wants to put into practice the coping skills she has learned over the years and to be more open to the help offered by the mental health professionals. Diagnosis remains major depression, recurrent severe without psychotic features.  The eating disorder features seemed to be no longer an issue  [facility] therapy.  Continue current medications

## 2016-08-27 ENCOUNTER — Other Ambulatory Visit (HOSPITAL_COMMUNITY): Payer: 59 | Admitting: Licensed Clinical Social Worker

## 2016-08-27 DIAGNOSIS — F332 Major depressive disorder, recurrent severe without psychotic features: Secondary | ICD-10-CM

## 2016-08-27 DIAGNOSIS — F411 Generalized anxiety disorder: Secondary | ICD-10-CM

## 2016-08-27 DIAGNOSIS — F322 Major depressive disorder, single episode, severe without psychotic features: Secondary | ICD-10-CM | POA: Diagnosis not present

## 2016-08-27 NOTE — Psych (Signed)
   [facility]  Lucia EstelleKathryn M Krakowski [id number]  Session Time: 9 AM - 2 PM  Participation Level: Active  Behavioral Response: CasualAlertEuthymic  Type of Therapy: Group Therapy  Treatment Goals addressed: Coping  Interventions: CBT, DBT, Supportive and Reframing  Summary: Clinician facilitated check-in regarding current stressors and situation, and review of patient completed diary card. Clinician utilized active listening and empathetic response and validated patient emotions. Clinician facilitated discussion on support systems, motivation, and focus. Clinician introduced topic of assertiveness and utilized handout "An Assertive Bill of Rights." Clinician assessed for immediate needs, medication compliance and efficacy, and safety concerns.    Suicidal/Homicidal: Nowithout intent/plan  Therapist Response: Lucia EstelleKathryn M Carley is a 20 y.o. female who presents with depression symptoms. Patient arrived within time allowed and reports she is feeling "good." Patient rates her mood at a 8 on a 1- 10 scale with 10 being great.  Patient engaged in activity and discussion. Patient identified areas in which it is difficult to be assertive and why. patient problem solved ways to increase assertiveness. Patient reports becoming overwhelmed and having an "identity crisis" while discussing assertiveness. Patient was able to manage symptoms when guided by clinician. Patient demonstrates some progress as evidenced by continued upbeat demeanor and reframing in a positive direction. Patient denies SI/HI/self-harm thoughts.    Plan: Patient will continue in PHP and medication management. Work towards decreasing depression symptoms and increase emotional regulation and positive coping skills.    Diagnosis: Severe recurrent major depression without psychotic features (HCC) [F33.2]    1. Severe recurrent major depression without psychotic features [facility](HCC)       Donia GuilesJenny Vaishnav Demartin,  LCSW 08/27/2016

## 2016-08-28 ENCOUNTER — Other Ambulatory Visit (HOSPITAL_COMMUNITY): Payer: Self-pay

## 2016-08-28 ENCOUNTER — Ambulatory Visit (HOSPITAL_COMMUNITY): Payer: Self-pay

## 2016-08-28 NOTE — Psych (Signed)
   [facility]  Rebekah EstelleKathryn M Tate [id number]  Session Time: 9 AM - 2 PM  Participation Level: Active  Behavioral Response: CasualAlertEuthymic  Type of Therapy: Group Therapy  Treatment Goals addressed: Coping  Interventions: CBT, DBT, Supportive and Reframing  Summary: Clinician facilitated check-in regarding current stressors and situation, and review of patient completed diary card. Clinician utilized active listening and empathetic response and validated patient emotions. Clinician facilitated discussion on feelings of failure, trying new things, and communication. Group brainstormed ways to add positive things into life and ways to reframe disappointments to achieve positive outcomes.  Clinician assessed for immediate needs, medication compliance and efficacy, and safety concerns.    Suicidal/Homicidal: Nowithout intent/plan  Therapist Response: Rebekah Tate is a 20 y.o. female who presents with depression symptoms. Patient arrived within time allowed and reports she is feeling "good." Patient rates her mood at a 7 on a 1- 10 scale with 10 being great.  Patient engaged in activity and discussion. Patient defined concept of success for her and identified ways in which the perception of success effected her self esteem and willingness. Patient shared ways she can reframe thought patterns to alter these perceptions.  Patient demonstrates some progress as evidenced by stating how she managed escalated emotions last night, stating future social plans, and reframing negative thoughts as they arose. Patient denies SI/HI/self-harm thoughts.    Plan: Patient will continue in PHP and medication management. Work towards decreasing depression symptoms and increase emotional regulation and positive coping skills.    Diagnosis: Severe recurrent major depression without psychotic features (HCC) [F33.2]    1. Severe recurrent major depression without psychotic features  (HCC)   2. GAD (generalized anxiety disorder)       Donia GuilesJenny Boe Deans, LCSW 08/28/2016

## 2016-09-01 ENCOUNTER — Other Ambulatory Visit (HOSPITAL_COMMUNITY): Payer: 59 | Admitting: Licensed Clinical Social Worker

## 2016-09-01 DIAGNOSIS — F411 Generalized anxiety disorder: Secondary | ICD-10-CM

## 2016-09-01 DIAGNOSIS — F322 Major depressive disorder, single episode, severe without psychotic features: Secondary | ICD-10-CM | POA: Diagnosis not present

## 2016-09-01 DIAGNOSIS — F332 Major depressive disorder, recurrent severe without psychotic features: Secondary | ICD-10-CM

## 2016-09-02 ENCOUNTER — Ambulatory Visit (HOSPITAL_COMMUNITY): Payer: Self-pay

## 2016-09-02 ENCOUNTER — Encounter (HOSPITAL_COMMUNITY): Payer: Self-pay | Admitting: Occupational Therapy

## 2016-09-02 ENCOUNTER — Other Ambulatory Visit (HOSPITAL_COMMUNITY): Payer: 59 | Admitting: Occupational Therapy

## 2016-09-02 ENCOUNTER — Other Ambulatory Visit (HOSPITAL_COMMUNITY): Payer: 59 | Admitting: Licensed Clinical Social Worker

## 2016-09-02 DIAGNOSIS — F411 Generalized anxiety disorder: Secondary | ICD-10-CM

## 2016-09-02 DIAGNOSIS — R41840 Attention and concentration deficit: Secondary | ICD-10-CM | POA: Diagnosis not present

## 2016-09-02 DIAGNOSIS — F322 Major depressive disorder, single episode, severe without psychotic features: Secondary | ICD-10-CM | POA: Diagnosis not present

## 2016-09-02 DIAGNOSIS — F329 Major depressive disorder, single episode, unspecified: Secondary | ICD-10-CM | POA: Diagnosis not present

## 2016-09-02 DIAGNOSIS — F332 Major depressive disorder, recurrent severe without psychotic features: Secondary | ICD-10-CM

## 2016-09-02 DIAGNOSIS — J4520 Mild intermittent asthma, uncomplicated: Secondary | ICD-10-CM | POA: Diagnosis not present

## 2016-09-02 DIAGNOSIS — Z0000 Encounter for general adult medical examination without abnormal findings: Secondary | ICD-10-CM | POA: Diagnosis not present

## 2016-09-02 NOTE — Therapy (Signed)
[facility] PART[facility] H[facility] PROGRAM [address] [place], [place], [address] Phone: [phone]   Fax:  [phone]  Occupational Therapy Treatment  Patient Details  Name: Rebekah EstelleKathryn M Hasz MRN: [id number] Date of Birth: 08-20-1996 No Data Recorded  Encounter Date: 09/02/2016      OT End of Session - 09/02/16 1414    Visit Number 4   Number of Visits 6   Date for OT Re-Evaluation 09/11/16   Authorization Type UMR   OT Start Time 1030   OT Stop Time 1135   OT Time Calculation (min) 65 min   Activity Tolerance Patient tolerated treatment well   Behavior During Therapy [facility] for tasks assessed/performed      Past Medical History:  Diagnosis Date  . Allergy   . Anxiety   . Asthma   . Depression   . Seasonal allergies     Past Surgical History:  Procedure Laterality Date  . NO PAST SURGERIES      There were no vitals filed for this visit.      Subjective Assessment - 09/02/16 1414    Currently in Pain? No/denies      OT Group Session: Physical and Mental Health and Wellness:    S: "I struggle with patience sometimes."   O: Physical and mental health and wellness session completed with emphasis on connecting the two areas, effects of self-esteem and confidence building, how to improve physical and mental health, and goal setting. Patient provided with education on building positive physical and mental health practices to improve physical, emotional, and social well-being including exercise habits, nutrition, sleep, hand hygiene, and stress reduction.     A: Pt participated in physical and mental health and wellness occupational therapy treatment session [facility].  Pt completed self-esteem activity listing five strengths and five weaknesses, followed up with group discussion of which was easier to write and why. Pt participated in group discussion of defining emotional well-being and how this relates to both  physical and mental health. Pt participated in discussion and was receptive to education on the links between physical health and mental health, mind and body connections, and identifying ways to improve upon current health habits affecting mental and physical health and wellness. Pt identified various strategies to combat depression and ways to improve current lifestyle habits. Strategies provided to assist in improving physical and mental health balance including exercise, nutrition, stress management, sleep habits, and recognizing when changes are necessary.    P: Continue participation in skilled occupational therapy groups 1-2 times per week for 2 weeks in order to gain the necessary skills needed to return to full time community living and learn effective coping strategies to be a productive community resident.             OT Short Term Goals - 08/19/16 1239      OT SHORT TERM GOAL #1   Title Patient will be educated on strategies to improve psychosocial skills needed to participate fully in all daily, work, and leisure activities.   Time 3   Period Weeks   Status On-going     OT SHORT TERM GOAL #2   Title Patient will be educated on a HEP and independent with implementation of HEP.3   Time 3   Period Weeks   Status On-going     OT SHORT TERM GOAL #3   Title Patient will independently apply psychosocial skills and coping mechanisms to her daily activties in  order to function independently   Time 3   Period Weeks   Status On-going                  Plan - 09/02/16 1414    Rehab Potential Good   OT Frequency --  1-2x/week   OT Duration --  3 weeks   OT Treatment/Interventions Self-care/ADL training  coping skills mechanism training, psychosocial skill development, community reintegration   Consulted and Agree with Plan of Care Patient      Patient will benefit from skilled therapeutic intervention in order to improve the following deficits and impairments:    (decreased coping skills, decreased psychosocial skills)  Visit Diagnosis: Severe recurrent major depression without psychotic features (HCC)  GAD (generalized anxiety disorder)    Problem List Patient Active Problem List   Diagnosis Date Noted  . Suicidal ideation 08/20/2016  . MDD (major depressive disorder), recurrent severe, without psychosis (HCC) 08/20/2016  . Tendinopathy of left gluteus medius 07/24/2016  . Trapezius muscle spasm 07/24/2016  . ADD (attention deficit disorder) without hyperactivity 05/15/2016  . Anxiety 08/29/2011  . Severe recurrent major depression without psychotic features [facility](HCC) 08/29/2011   Ezra SitesLeslie Hailly Fess, OTR/L  [phone] 09/02/2016, 2:15 PM  [facility] [address] [place], [place], [address] Phone: [phone]   Fax:  [phone]  Name: Rebekah EstelleKathryn M Reller MRN: [id number] Date of Birth: November 15, 1995

## 2016-09-02 NOTE — Psych (Signed)
   [facility] PHP THERAPIST PROGRESS NOTE  Rebekah Tate [id number]  Session Time: 9 AM - 2 PM  Participation Level: Active  Behavioral Response: CasualAlertDepressed  Type of Therapy: Group Therapy  Treatment Goals addressed: Coping  Interventions: CBT, DBT, Supportive and Reframing  Summary: Clinician facilitated check-in regarding current stressors and situation, and review of patient completed diary card. Clinician utilized active listening and empathetic response and validated patient emotions. Clinician facilitated discussion on recovery, relationships, and anger. Clinician continued topic of self esteem including assertiveness, values, and  FAST skill. Clinician assessed for immediate needs, medication compliance and efficacy, and safety concerns.    Suicidal/Homicidal: Nowithout intent/plan  Therapist Response: Rebekah Tate is a 20 y.o. female who presents with depression symptoms. Patient arrived within time allowed and reports she is feeling "pretty good." Patient rates her mood at a 6 on a 1- 10 scale with 10 being great.  Patient engaged in activity and discussion. Patient was able to discuss areas of self esteem which are a struggle. Patient identified tools and strategies to help improve her self esteem.   Patient demonstrates some progress as evidenced by reporting awareness of anxious feelings over the weekend and sharing her attempts to self-manage. Patient denies SI/HI/self-harm thoughts.    Plan: Patient will continue in PHP and medication management. Work towards decreasing depression symptoms and increase emotional regulation and positive coping skills.    Diagnosis: Severe recurrent major depression without psychotic features (HCC) [F33.2]    1. Severe recurrent major depression without psychotic features (HCC)   2. GAD (generalized anxiety disorder)       [name], LCSW 09/02/2016

## 2016-09-03 ENCOUNTER — Other Ambulatory Visit (HOSPITAL_COMMUNITY): Payer: 59 | Admitting: Licensed Clinical Social Worker

## 2016-09-03 DIAGNOSIS — F411 Generalized anxiety disorder: Secondary | ICD-10-CM

## 2016-09-03 DIAGNOSIS — F322 Major depressive disorder, single episode, severe without psychotic features: Secondary | ICD-10-CM | POA: Diagnosis not present

## 2016-09-03 DIAGNOSIS — F332 Major depressive disorder, recurrent severe without psychotic features: Secondary | ICD-10-CM

## 2016-09-04 ENCOUNTER — Ambulatory Visit (INDEPENDENT_AMBULATORY_CARE_PROVIDER_SITE_OTHER): Payer: 59 | Admitting: Sports Medicine

## 2016-09-04 ENCOUNTER — Other Ambulatory Visit (HOSPITAL_COMMUNITY): Payer: 59 | Admitting: Specialist

## 2016-09-04 ENCOUNTER — Encounter: Payer: Self-pay | Admitting: Sports Medicine

## 2016-09-04 ENCOUNTER — Other Ambulatory Visit (HOSPITAL_COMMUNITY): Payer: 59 | Admitting: Licensed Clinical Social Worker

## 2016-09-04 ENCOUNTER — Ambulatory Visit (HOSPITAL_COMMUNITY): Payer: Self-pay

## 2016-09-04 VITALS — BP 120/67

## 2016-09-04 DIAGNOSIS — F411 Generalized anxiety disorder: Secondary | ICD-10-CM

## 2016-09-04 DIAGNOSIS — M41124 Adolescent idiopathic scoliosis, thoracic region: Secondary | ICD-10-CM

## 2016-09-04 DIAGNOSIS — F332 Major depressive disorder, recurrent severe without psychotic features: Secondary | ICD-10-CM

## 2016-09-04 DIAGNOSIS — M549 Dorsalgia, unspecified: Secondary | ICD-10-CM | POA: Diagnosis not present

## 2016-09-04 DIAGNOSIS — M67952 Unspecified disorder of synovium and tendon, left thigh: Secondary | ICD-10-CM | POA: Diagnosis not present

## 2016-09-04 DIAGNOSIS — F322 Major depressive disorder, single episode, severe without psychotic features: Secondary | ICD-10-CM | POA: Diagnosis not present

## 2016-09-04 DIAGNOSIS — S93402A Sprain of unspecified ligament of left ankle, initial encounter: Secondary | ICD-10-CM | POA: Insufficient documentation

## 2016-09-04 DIAGNOSIS — M4125 Other idiopathic scoliosis, thoracolumbar region: Secondary | ICD-10-CM | POA: Diagnosis not present

## 2016-09-04 NOTE — Psych (Signed)
[facility]2016 [facility] Psych Discharge Summary  Rebekah EstelleKathryn M Tate [id number]  Admission date: 08/26/2016 Discharge date: 09/05/2016  Reason for admission: depression  Progress in Program Toward Treatment Goals: this episode of her stay in PHP was much different in that she accepts th;at only she can make changes in her life and she is optimistic that she can do so.  She is realistic in that there will be ups and downs but believes she can maintain the direction.  She recognizes her need for control and is working on greater comfort with less control.  She has so many strengths that she should be okay in the future.  Progress (rationale): not sure what clicked into place for her she says but whatever it was she is grateful  Discharge Plan: Referral to Psychiatrist and Referral to Counselor/Psychotherapist    Carolanne GrumblingGerald Theodor Mustin, MD 09/04/2016

## 2016-09-04 NOTE — Patient Instructions (Addendum)
It was nice meeting you today Rebekah Tate!  Perform the following exercises holding a light dumbbell:  - Pull your left arm back in a rowing motion as far as you can.  - Do left arm "shake outs", moving your arm straight up and down.  - Reach your left arm out to the left and hold the stretch for 10 seconds, then repeat.   Postural stretches  Start adding Z exercises into your running.  Continue doing the standing rotations, step ups, and other hip exercises as you have been.   You can also use meloxicam (Mobic) as needed.

## 2016-09-04 NOTE — Assessment & Plan Note (Signed)
Occurred 1.5 weeks ago. Healing well. Minimal swelling of lateral malleolar region. No ecchymosis or tenderness to palpation. Good stability with one foot standing exercises.  - Begin one foot standing exercises to prevent recurrent sprains - F/u PRN

## 2016-09-04 NOTE — Psych (Signed)
   [facility]  Rebekah EstelleKathryn M Badger [id number]  Session Time: 9 AM - 2 PM  Participation Level: Active  Behavioral Response: CasualAlertEuthymic  Type of Therapy: Group Therapy  Treatment Goals addressed: Coping  Interventions: CBT, DBT, Supportive and Reframing  Summary: Clinician facilitated check-in regarding current stressors and situation, and review of patient completed diary card. Clinician utilized active listening and empathetic response and validated patient emotions. Clinician facilitated discussion on rumination, grounding, and scheduling. Group workshopped communication scenario and reviewed I Statements. Clinician introduced topic of Cognitive Distortions.  Clinician assessed for immediate needs, medication compliance and efficacy, and safety concerns.    Suicidal/Homicidal: Nowithout intent/plan  Therapist Response: Rebekah Tate is a 20 y.o. female who presents with depression symptoms. Patient arrived within time allowed and reports she is feeling "good." Patient rates her mood at a 9 on a 1- 10 scale with 10 being great.  Patient engaged in activity and discussion. Patient participated in Scientist, research (physical sciences)communication workshop. Patient offered examples of cognitive distortions from their life.  Patient demonstrates continued progress as evidenced by language evident that she is internalizing topics discussed in group. Patient reports she is restless for discharge. Patient denies SI/HI/self-harm thoughts.    Plan: Patient will continue in PHP and medication management. Work towards decreasing depression symptoms and increase emotional regulation and positive coping skills. Patient is working towards discharge on 09/05/16.    Diagnosis: Severe recurrent major depression without psychotic features (HCC) [F33.2]    1. Severe recurrent major depression without psychotic features (HCC)   2. GAD (generalized anxiety disorder)       Donia GuilesJenny Freeda Spivey,  LCSW 09/04/2016

## 2016-09-04 NOTE — Progress Notes (Signed)
   Subjective:    Patient ID: Rebekah Tate, female    DOB: 02-09-1996, 20 y.o.   MRN: [id number]  HPI  Patient presents for follow up of L hip pain and scoliosis. Also with new ankle sprain.   L hip pain Patient reports improvement in L hip pain since last visit on 11/02. Has been doing all of the hip exercises she was given three times a day since then. Frequency of pain has now decreased from about 8 times per week to 3 times per week, however pain still occurs frequently enough that it is bothersome. Patient is no longer playing field hockey. The pain occurs randomly, not associated with activity. Patient may even begin to experience pain after she has just been sitting down for a while. She took Mobic daily for two weeks after last appointment as instructed and reported improvement in symptoms, however she thought she was not to take Mobic after that, so has not tried it for a few weeks. Reports persistent pain is located in same spot as prior. Stretching does improve her pain.   Scoliosis:  Follow up to discuss exercises to lessen upper back and neck pain  Ankle sprain Reports sprained L ankle 1.5 weeks ago. Reports symptoms have significantly improved, however she still has some swelling and pain with motion. Denies any bruising. Swelling and pain located laterally. Is able to bear full weight without issue. Did not seek care before today. Has not had any imaging performed. Is able to wear shoes and walk normally.   Review of Systems Denies numbness, tingling, sharp pains in L hip or leg.     Objective:   Physical Exam  Constitutional: She is oriented to person, place, and time. She appears well-developed and well-nourished. No distress.  HENT:  Head: Normocephalic and atraumatic.  Pulmonary/Chest: Effort normal. No respiratory distress.  Musculoskeletal:  Shoulders/back -  L shoulder ~1 in higher than R on inspection L scapula rotated externally on inspection No TTP of  shoulders, back, or spine Full back ROM  Hip -  No obvious bony abnormalities or asymmetry on inspection ~110 degrees of rotation on R and L hip R pelvis slightly tighter than L on manipulation L hip abduction significantly weaker than R   Ankle -  No obvious bony abnormalities Minimal swelling near lateral malleolus No TTP  No ecchymosis, erythema Able to bear full body weight   Neurological: She is alert and oriented to person, place, and time.  Psychiatric: She has a normal mood and affect. Her behavior is normal.      Assessment & Plan:  Trapezius muscle spasm Likely associated with scoliosis.  - Begin stretching exercises outlined during appointment - Provided referral and contact information for PT with pilates - F/u in 3 months or sooner if needed   Tendinopathy of left gluteus medius Some improvement in pain with stretching, however still with some pain and weakness on exam.  - Continue exercises - F/u in 3 months   Left ankle sprain Occurred 1.5 weeks ago. Healing well. Minimal swelling of lateral malleolar region. No ecchymosis or tenderness to palpation. Good stability with one foot standing exercises.  - Begin one foot standing exercises to prevent recurrent sprains - F/u PRN  Rebekah AbernethyAbigail J Suhaib Guzzo, MD, MPH PGY-2 [facility] Pager [phone]  I observed and examined the patient with the resident and agree with assessment and plan.  Note reviewed and modified by me. Rebekah BaasKarl Fields, MD

## 2016-09-04 NOTE — Progress Notes (Signed)
Ms Rebekah Tate requests discharge today and I agree with that.  She has reached full benefit and was scheduled for discharge tomorrow. Discharge today

## 2016-09-04 NOTE — Assessment & Plan Note (Addendum)
Some improvement in pain with stretching, however still with some pain and weakness on exam.  Add some dynamic running zig zags - Continue exercises - F/u in 3 months

## 2016-09-04 NOTE — Assessment & Plan Note (Addendum)
Significant curve noted in throacic spine  Spasm Likely associated with scoliosis.  - Begin stretching exercises outlined during appointment - Provided referral and contact information for PT with pilates - F/u in 3 months or sooner if needed

## 2016-09-04 NOTE — Psych (Signed)
   [facility]  Rebekah EstelleKathryn M Dechellis [id number]  Session Time: 9 AM - 2 PM  Participation Level: Active  Behavioral Response: CasualAlertDepressed  Type of Therapy: Group Therapy  Treatment Goals addressed: Coping  Interventions: CBT, DBT, Supportive and Reframing  Summary: Clinician facilitated check-in regarding current stressors and situation, and review of patient completed diary card. Clinician utilized active listening and empathetic response and validated patient emotions. Clinician facilitated discussion on positive affirmations, patterns in behaviors, self care, and trust. Clinician introduced topic of  wise mind. Group worked on reframing negative thoughts to allow dialectical thinking. Clinician assessed for immediate needs, medication compliance and efficacy, and safety concerns.    Suicidal/Homicidal: Nowithout intent/plan  Therapist Response: Rebekah Tate is a 20 y.o. female who presents with depression symptoms. Patient arrived late due to an appointment with her PCP and reports she is feeling "pretty good." Patient rates her mood at a 8 on a 1- 10 scale with 10 being great.  Patient engaged in activity and discussion. Patient shared ways in which negative thinking effected her daily life and was able to effectively reframe those thoughts.  Patient demonstrates continued progress as evidenced by stating she feels improved in mood and outlook and is ready to return to her second job. Patient denies SI/HI/self-harm thoughts.    Plan: Patient will continue in PHP and medication management. Work towards decreasing depression symptoms and increase emotional regulation and positive coping skills.    Diagnosis: Severe recurrent major depression without psychotic features (HCC) [F33.2]    1. Severe recurrent major depression without psychotic features (HCC)   2. GAD (generalized anxiety disorder)       Donia GuilesJenny Lakesa Coste, LCSW 09/04/2016

## 2016-09-04 NOTE — Therapy (Signed)
[place], [place], [address] Phone: [phone]   Fax:  [phone]  Occupational Therapy Treatment  Patient Details  Name: Rebekah Tate MRN: [id number] Date of Birth: 10/06/1995 No Data Recorded  Encounter Date: 09/04/2016      OT End of Session - 09/04/16 1734    Visit Number 5   Number of Visits 6   Date for OT Re-Evaluation 09/11/16   Authorization Type UMR   OT Start Time 1030   OT Stop Time 1125   OT Time Calculation (min) 55 min   Activity Tolerance Patient tolerated treatment well   Behavior During Therapy [facility] for tasks assessed/performed      Past Medical History:  Diagnosis Date  . Allergy   . Anxiety   . Asthma   . Depression   . Seasonal allergies     Past Surgical History:  Procedure Laterality Date  . NO PAST SURGERIES      There were no vitals filed for this visit.      Subjective Assessment - 09/04/16 1734    Currently in Pain? No/denies   Pain Score 0-No pain          S:  I think life is stressing me out. O:  Patient actively participated in the following skilled occupational therapy group this date: o Stress management - emotional regulation, behavioral regulation, exercise nutrition Patient remained focused and engaged in group. A:  Patient participated in skilled occupational therapy group for stress management skills this date.  Patient was engaged throughout session.  P:  DC from skilled OT intervention this date. Vangie Bicker, [place], OTR/L [phone]                         OT Education - 09/04/16 1734    Education provided Yes   Education Details stress management skills and coping skills   Person(s) Educated Patient   Methods Explanation;Handout   Comprehension Verbalized understanding          OT Short Term Goals - 09/04/16 1735      OT SHORT TERM GOAL #1   Title Patient will be educated on strategies to  improve psychosocial skills needed to participate fully in all daily, work, and leisure activities.   Time 3   Period Weeks   Status Achieved     OT SHORT TERM GOAL #2   Title Patient will be educated on a HEP and independent with implementation of HEP.3   Time 3   Period Weeks   Status Achieved     OT SHORT TERM GOAL #3   Title Patient will independently apply psychosocial skills and coping mechanisms to her daily activties in order to function independently   Time 3   Period Weeks   Status Achieved                  Plan - 09/04/16 1735    OT Frequency --  1-2 x week   OT Duration --  3 weeks   OT Treatment/Interventions Self-care/ADL training   Consulted and Agree with Plan of Care Patient      Patient will benefit from skilled therapeutic intervention in order to improve the following deficits and impairments:   (decreased coping skills, decreased psychosocial skills)  Visit Diagnosis: Severe recurrent major depression without psychotic features ([employer])  GAD (generalized anxiety disorder)    Problem List Patient Active Problem List  Diagnosis Date Noted  . Left ankle sprain 09/04/2016  . Suicidal ideation 08/20/2016  . MDD (major depressive disorder), recurrent severe, without psychosis ([place]) 08/20/2016  . Tendinopathy of left gluteus medius 07/24/2016  . Idiopathic scoliosis 07/24/2016  . ADD (attention deficit disorder) without hyperactivity 05/15/2016  . Anxiety 08/29/2011  . Severe recurrent major depression without psychotic features ([place]) 08/29/2011    Vangie Bicker, [place], OTR/L [phone]  09/04/2016, 5:36 PM OCCUPATIONAL THERAPY DISCHARGE SUMMARY  Visits from Start of Care: 5  Current functional level related to goals / functional outcomes: See above   Remaining deficits: See above   Education / Equipment: See above Plan: Patient agrees to discharge.  Patient goals were partially met. Patient is being discharged due to  meeting the stated rehab goals.  ?????        Vangie Bicker, [place], OTR/L [place], [place], [address] Phone: [phone]   Fax:  [phone]  Name: Rebekah Tate MRN: [id number] Date of Birth: [birth year]

## 2016-09-04 NOTE — Psych (Signed)
   [facility]  Rebekah EstelleKathryn M Bora [id number]  Session Time: 9 AM - 2 PM  Participation Level: Active  Behavioral Response: CasualAlertEuthymic  Type of Therapy: Group Therapy  Treatment Goals addressed: Coping  Interventions: CBT, DBT, Supportive and Reframing  Summary: Clinician facilitated check-in regarding current stressors and situation, and review of patient completed diary card. Clinician utilized active listening and empathetic response and validated patient emotions. Clinician facilitated discussion on anger, healthy outlets, and relationships.  Clinician introduced topic of boundaries.  Clinician assessed for immediate needs, medication compliance and efficacy, and safety concerns.    Suicidal/Homicidal: Nowithout intent/plan  Therapist Response: Rebekah Tate is a 20 y.o. female who presents with depression symptoms. Patient arrived within time allowed and reports she is feeling "good." Patient rates her mood at a 9-10 on a 1- 10 scale with 10 being great.  Patient engaged in activity and discussion. Patient reports understanding of boundaries and was able to assess what state his boundaries are currently in. Patient demonstrates continued progress as evidenced by increased report of social activities outside of group and returned motivation for goal oriented behavior. Patient denies SI/HI/self-harm thoughts.    Plan: Patient will discharge from PHP due to meeting treatment goals of decreased depression and anxiety symptoms and increased ability to manage distress and regulate emotions. Improvement is evidenced by self report, observation, and screening tool scores. Pt is discharging a day early per her request. Psychiatrist has approved discharge. Pt will step down to outpatient treatment and has requested to return to her previous providers. [facility] and has psychiatry appointment with [name] at this  agency on 09/25/16.    Diagnosis: Severe recurrent major depression without psychotic features (HCC) [F33.2]    1. Severe recurrent major depression without psychotic features [facility](HCC)       Donia GuilesJenny Prosperity Darrough, LCSW 09/04/2016

## 2016-09-08 ENCOUNTER — Other Ambulatory Visit (HOSPITAL_COMMUNITY): Payer: Self-pay

## 2016-09-08 DIAGNOSIS — F419 Anxiety disorder, unspecified: Secondary | ICD-10-CM | POA: Diagnosis not present

## 2016-09-08 DIAGNOSIS — F329 Major depressive disorder, single episode, unspecified: Secondary | ICD-10-CM | POA: Diagnosis not present

## 2016-09-09 ENCOUNTER — Ambulatory Visit (HOSPITAL_COMMUNITY): Payer: Self-pay

## 2016-09-10 ENCOUNTER — Other Ambulatory Visit (HOSPITAL_COMMUNITY): Payer: Self-pay

## 2016-09-10 MED ORDER — FLUOXETINE HCL 40 MG PO CAPS: 40.0000 mg | ORAL_CAPSULE | Freq: Every day | ORAL | 0 refills | Status: DC

## 2016-09-11 ENCOUNTER — Ambulatory Visit (HOSPITAL_COMMUNITY): Payer: Self-pay

## 2016-09-12 ENCOUNTER — Other Ambulatory Visit (HOSPITAL_COMMUNITY): Payer: Self-pay

## 2016-09-15 ENCOUNTER — Other Ambulatory Visit (HOSPITAL_COMMUNITY): Payer: Self-pay

## 2016-09-16 ENCOUNTER — Other Ambulatory Visit (HOSPITAL_COMMUNITY): Payer: Self-pay

## 2016-09-16 ENCOUNTER — Ambulatory Visit (HOSPITAL_COMMUNITY): Payer: Self-pay

## 2016-09-17 ENCOUNTER — Other Ambulatory Visit (HOSPITAL_COMMUNITY): Payer: Self-pay

## 2016-09-18 ENCOUNTER — Other Ambulatory Visit (HOSPITAL_COMMUNITY): Payer: Self-pay

## 2016-09-18 ENCOUNTER — Ambulatory Visit (HOSPITAL_COMMUNITY): Payer: Self-pay

## 2016-09-22 ENCOUNTER — Other Ambulatory Visit (HOSPITAL_COMMUNITY): Payer: Self-pay

## 2016-09-23 ENCOUNTER — Telehealth: Payer: Self-pay | Admitting: *Deleted

## 2016-09-23 ENCOUNTER — Telehealth: Payer: Self-pay | Admitting: Physical Therapy

## 2016-09-23 ENCOUNTER — Ambulatory Visit (HOSPITAL_COMMUNITY): Payer: Self-pay

## 2016-09-23 ENCOUNTER — Ambulatory Visit: Payer: 59 | Attending: Sports Medicine | Admitting: Physical Therapy

## 2016-09-23 DIAGNOSIS — M25552 Pain in left hip: Secondary | ICD-10-CM | POA: Diagnosis not present

## 2016-09-23 DIAGNOSIS — R293 Abnormal posture: Secondary | ICD-10-CM | POA: Insufficient documentation

## 2016-09-23 DIAGNOSIS — M545 Low back pain, unspecified: Secondary | ICD-10-CM

## 2016-09-23 DIAGNOSIS — R29898 Other symptoms and signs involving the musculoskeletal system: Secondary | ICD-10-CM | POA: Diagnosis not present

## 2016-09-23 DIAGNOSIS — M6281 Muscle weakness (generalized): Secondary | ICD-10-CM | POA: Diagnosis not present

## 2016-09-23 DIAGNOSIS — M546 Pain in thoracic spine: Secondary | ICD-10-CM | POA: Diagnosis not present

## 2016-09-23 NOTE — Therapy (Addendum)
Rebekah Tate, [place], [address] Phone: [phone]   Fax:  [phone]  Physical Therapy Evaluation/Discharge   Patient Details  Name: Rebekah Tate MRN: [id number] Date of Birth: Sep 05, 1996 Referring Provider: Dr. Stefanie Libel   Encounter Date: 09/23/2016      PT End of Session - 09/23/16 1333    Visit Number 1   Number of Visits 16   Date for PT Re-Evaluation 11/18/16   PT Start Time 1147   PT Stop Time 1245   PT Time Calculation (min) 58 min   Activity Tolerance Patient tolerated treatment well   Behavior During Therapy [facility] for tasks assessed/performed      Past Medical History:  Diagnosis Date  . Allergy   . Anxiety   . Asthma   . Depression   . Seasonal allergies     Past Surgical History:  Procedure Laterality Date  . NO PAST SURGERIES      There were no vitals filed for this visit.       Subjective Assessment - 09/23/16 1154    Subjective Pt with chronic back pain (upper, lower) which she states she was always had.  She reports L hip pain which began last year.  She is an avid exerciser, somewhat excessive over 2 yrs.  Reports it is her coping mechanism.  Her pain is constant and interferes in her normal workout routines, work at  [employer] and as a Garment/textile technologist at [employer].  She reports tightness in back and neck, hip pain, no radiation into she cannot walk > .5 mile.  She reports she has to pop her back each morning and throughout the day.  Neck is stiff L upper back.      Limitations Walking;Standing;House hold activities;Other (comment);Sitting  swim coach, running, gym , descending stairs    Diagnostic tests XR and [place] to L hip (glute med)   Patient Stated Goals to be able to be physically active    Currently in Pain? Yes   Pain Location Back   Pain Orientation Left;Upper;Mid;Lower   Pain Descriptors / Indicators Tightness;Aching   Pain Type Chronic pain   Pain Onset More than a month ago    Pain Frequency Constant   Aggravating Factors  jumping jacks, kicking with paddle board    Pain Relieving Factors popping, foam roller, hot pack , massage, hot bath , resting really does not help her pain    Effect of Pain on Daily Activities likes to work out 1-2 hours per day             [facility] PT Assessment - 09/23/16 1203      Assessment   Medical Diagnosis scoliosis with back and L hip pain    Referring Provider Dr. Stefanie Libel    Onset Date/Surgical Date --  chronic    Prior Therapy Yes, [employer]      Precautions   Precautions None     Restrictions   Weight Bearing Restrictions No     Balance Screen   Has the patient fallen in the past 6 months No     [place] residence     Prior Function   Level of Independence Independent   Vocation Full time employment   Radiation protection practitioner, [employer] (lifting, squatting)    Leisure gym , family      Sensation   Light Touch Appears Intact     Posture/Postural Control  Posture/Postural Control Postural limitations   Posture Comments high L shoulder prominent L scapula.  retract and rot Rt. thoracic , L ASIS lower and more superior as pelvis rot FW , Rt. hip higher      AROM   Lumbar Flexion WFL, Rt. rotation    Lumbar Extension WFL with pinching central    Lumbar - Right Side Bend just above Rt. knee   Lumbar - Left Side Bend just beyond L knee      Strength   Right/Left Shoulder --  UE grossly 4/5 to 4+/5 shoulders , WEAK SERRATUS   Right Hip Flexion 4+/5   Right Hip Extension 4+/5   Right Hip ABduction 4/5  glute med 4-/5   Left Hip Flexion 4+/5   Left Hip Extension 4-/5   Left Hip ABduction 3+/5  glute med 3/5   Right Knee Flexion 4+/5   Right Knee Extension 4+/5   Left Knee Flexion 4+/5   Left Knee Extension 4+/5     Flexibility   Hamstrings lacks 20 dg in 90/90    Quadriceps good, hip flex good      Palpation   Palpation comment sore and tight Rt.  thoracic and periscapular                    [facility] Adult PT Treatment/Exercise - 09/23/16 1203      Lumbar Exercises: Quadruped   Madcat/Old Horse 5 reps   Plank modified 30 sec    Other Quadruped Lumbar Exercises childs pose      Moist Heat Therapy   Number Minutes Moist Heat 15 Minutes   Moist Heat Location Lumbar Spine     Electrical Stimulation   Electrical Stimulation Location lumbar and Rt. hip as hip pain increased    Electrical Stimulation Action IFC   Electrical Stimulation Parameters to tol    Electrical Stimulation Goals Pain     Manual Therapy   Manual Therapy Manual Traction   Manual therapy comments to L hip, increased pain in lumbar                 PT Education - 09/23/16 1332    Education provided Yes   Education Details HEP, Caution with gym ex (see instructions), IFC , posture   Person(s) Educated Patient   Methods Explanation;Handout;Tactile cues;Verbal cues   Comprehension Verbalized understanding;Need further instruction          PT Short Term Goals - 09/23/16 1341      PT SHORT TERM GOAL #1   Title Pt will be I with initial HEP for core stability.    Time 4   Period Weeks     PT SHORT TERM GOAL #2   Title Pt will be able to reduce frequency of popping her joints to relieve joint stiffness    Time 4   Period Weeks   Status New     PT SHORT TERM GOAL #3   Title Pt will be able to do 60 min modified workout with min exacerbation of pain.     Time 4   Period Weeks   Status New           PT Long Term Goals - 09/23/16 1343      PT LONG TERM GOAL #1   Title Pt will be able to squat, lift items at the [facility] with good mechanics and no pain increase    Time 8   Period Weeks   Status New  PT LONG TERM GOAL #2   Title Pt will be able to sleep without interruption of pain most nights of the week.    Time 8   Period Weeks   Status New     PT LONG TERM GOAL #3   Title Pt will be able to have min to no pain at  rest for ADLs.     Time 8   Period Weeks   Status New     PT LONG TERM GOAL #4   Title Pt will increase UE strength and stability to 4+/5 for improved safety with gym routine.    Time 8   Period Weeks   Status New     PT LONG TERM GOAL #5   Title Pt will demo hip abd and glute med strength to 4+/5 bilaterally to maximize quality of movement in the gym   Time 8   Period Weeks   Status New               Plan - 09/23/16 1334    Clinical Impression Statement Patient with low complexity eval of upper and lower back. Her pain is continuous from L side of neck, upper , middle and lower back to L hip.  She was advised to significantly modify her routine and try to avoid over popping her spine.  I think this is a habit she has become reliant on, may be worsening her situation in the long term.  She has weak post lat. chain and upper back mm which we will target with Pilates equipment, work on elongation of spine to reduce compressive forces.     Rehab Potential Good   Clinical Impairments Affecting Rehab Potential Mental health   PT Frequency 2x / week   PT Duration 8 weeks  may taper to 1 time per week    PT Treatment/Interventions Moist Heat;Therapeutic activities;Dry needling;Therapeutic exercise;Manual techniques;Taping;Electrical Stimulation;Functional mobility training;Patient/family education;Cryotherapy;Neuromuscular re-education;Other (comment)  Pilates based PT    PT Next Visit Plan check HEP, traction/mobs to L hip, Pilates: ELONGATE Tower push through bar, Reformer prone box, pulling straps, quadruped work, Psychologist, clinical as needed    PT Home Exercise Plan childs pose, cat/camel and modified plank   Consulted and Agree with Plan of Care Patient      Patient will benefit from skilled therapeutic intervention in order to improve the following deficits and impairments:  Increased fascial restricitons, Pain, Decreased mobility, Decreased strength, Postural dysfunction, Impaired  flexibility  Visit Diagnosis: Pain in left hip  Other symptoms and signs involving the musculoskeletal system  Abnormal posture  Muscle weakness (generalized)  Pain in thoracic spine  Left-sided low back pain without sciatica, unspecified chronicity     Problem List Patient Active Problem List   Diagnosis Date Noted  . Left ankle sprain 09/04/2016  . Suicidal ideation 08/20/2016  . MDD (major depressive disorder), recurrent severe, without psychosis ([place]) 08/20/2016  . Tendinopathy of left gluteus medius 07/24/2016  . Idiopathic scoliosis 07/24/2016  . ADD (attention deficit disorder) without hyperactivity 05/15/2016  . Anxiety 08/29/2011  . Severe recurrent major depression without psychotic features ([place]) 08/29/2011    PAA,JENNIFER 09/23/2016, 1:58 PM  [facility] [address] [place], [place], [address] Phone: [phone]   Fax:  [phone]  Name: Rebekah Tate MRN: [id number] Date of Birth: 12/14/1995  Raeford Razor, PT 09/23/16 2:00 PM Phone: [phone] Fax: [phone]  PHYSICAL THERAPY DISCHARGE SUMMARY  Visits from Start of Care: 1  Current functional  level related to goals / functional outcomes: See above    Remaining deficits: Unchanged, unknown did not return to clinic   Education / Equipment: HEP, basic and PT/POC  Plan: Patient agrees to discharge.  Patient goals were not met. Patient is being discharged due to not returning since the last visit.  ?????    Patient being seen by Dr. Mardelle Matte, PT 10/16/16 3:36 PM Phone: [phone] Fax: [phone]

## 2016-09-23 NOTE — Patient Instructions (Signed)
STOP doing crunches , running, overhead lifting.    OK to do elliptical and biking  Quadruped cat /camel   childs pose   Plank modified   Angry Cat Stretch    Tuck chin and tighten stomach, arching back. Repeat __10__ times per set. Do _1-2___ sets per session. Do ____2   sessions per day.  http://orth.exer.us/119   Copyright  VHI. All rights reserved.   Shell    Prone, push torso back, folding over legs. Push hips toward heels, allowing head and arms to settle toward mat. Relax, breathing deeply into back for __3__ full breaths. Repeat __3-5__ times. Do ___2_ sessions per day.  http://pm.exer.us/45   Copyright  VHI. All rights reserved.  Abduction: Clam (Eccentric) - Side-Lying    Lie on side with knees bent. Lift top knee, keeping feet together. Keep trunk steady. Slowly lower for 3-5 seconds. _20__ reps per set, _1-2__ sets per day, __5-7_ days per week.   http://ecce.exer.us/65   Copyright  VHI. All rights reserved.    Abduction: Side Leg Lift (Eccentric) - Side-Lying    Lie on side. Lift top leg slightly higher than shoulder level. Keep top leg straight with body, toes pointing forward. Slowly lower for 3-5 seconds. 10-20___ reps per set, _1-2__ sets per day, ___5-7 days per week.   http://ecce.exer.us/63   Copyright  VHI. All rights reserved.

## 2016-09-23 NOTE — Telephone Encounter (Signed)
Talked with patient and she is going to PT today for the first time since her office visit with us. Recommended doing a couple of sessions with the therapist and also have therapist fax her/his recommendations from today's visit to us.

## 2016-09-23 NOTE — Telephone Encounter (Signed)
Note created in error Karie MainlandJennifer Cire Clute, PT 09/23/16 4:31 PM Phone: [phone] Fax: [phone]

## 2016-09-25 ENCOUNTER — Ambulatory Visit (HOSPITAL_COMMUNITY): Payer: Self-pay

## 2016-09-25 ENCOUNTER — Encounter (HOSPITAL_COMMUNITY): Payer: Self-pay | Admitting: Psychiatry

## 2016-09-25 ENCOUNTER — Ambulatory Visit (INDEPENDENT_AMBULATORY_CARE_PROVIDER_SITE_OTHER): Payer: 59 | Admitting: Psychiatry

## 2016-09-25 VITALS — BP 110/64 | HR 59 | Ht 60.0 in | Wt 97.4 lb

## 2016-09-25 DIAGNOSIS — F3342 Major depressive disorder, recurrent, in full remission: Secondary | ICD-10-CM

## 2016-09-25 DIAGNOSIS — F900 Attention-deficit hyperactivity disorder, predominantly inattentive type: Secondary | ICD-10-CM | POA: Diagnosis not present

## 2016-09-25 DIAGNOSIS — F329 Major depressive disorder, single episode, unspecified: Secondary | ICD-10-CM | POA: Diagnosis not present

## 2016-09-25 DIAGNOSIS — F411 Generalized anxiety disorder: Secondary | ICD-10-CM | POA: Diagnosis not present

## 2016-09-25 DIAGNOSIS — M419 Scoliosis, unspecified: Secondary | ICD-10-CM | POA: Diagnosis not present

## 2016-09-25 DIAGNOSIS — F419 Anxiety disorder, unspecified: Secondary | ICD-10-CM | POA: Diagnosis not present

## 2016-09-25 MED ORDER — FLUOXETINE HCL 40 MG PO CAPS: 40.0000 mg | ORAL_CAPSULE | Freq: Every day | ORAL | 0 refills | Status: DC

## 2016-09-25 MED ORDER — TRAZODONE HCL 100 MG PO TABS: 100.0000 mg | ORAL_TABLET | Freq: Every day | ORAL | 1 refills | Status: DC

## 2016-09-25 MED ORDER — TRAZODONE HCL 100 MG PO TABS: 100.0000 mg | ORAL_TABLET | Freq: Every day | ORAL | 2 refills | Status: DC

## 2016-09-25 NOTE — Progress Notes (Signed)
Patient ID: Rebekah Tate, female   DOB: 02-17-1996, 21 y.o.   MRN: [id number]   [facility] Follow-up Outpatient Visit  ATARA PATERSON 05/26/1996  Date of visit 09/25/2016   Subjective: I'm doing well with my depression and anxiety even though I recently got diagnosed with scoliosis and I am told by my physical therapists that I can no longer run  Patient is a 21 year old female diagnosed with major depressive disorder recurrent, generalized anxiety disorder, ADD inattentive type. Patient was recently hospitalized for suicidal ideation and on discharge attended partial hospitalization program. Patient reports that she enjoyed the partial hospitalization program and adds that it helped with her coping skills, her mood and her anxiety. Patient states that even though she was recently diagnosed with scoliosis, she's been able to handle the diagnoses and not get depressed or anxious  Patient has that she is seeing her individual counselor, reports that she likes her and plans to continue to see her twice a month. She also adds that she seen a physical therapist to help with her scoliosis and can no longer run which is something she enjoyed and it also help decrease her stress  Patient states that she's not had suicidal thoughts since her discharge from the hospital, reports that she's eating better, sleeping fine and interacting well with her family.  Patient denies any activating features on the Prozac. She denies any symptoms of mania or psychosis at this visit. She also denies any tobacco use, alcohol or illicit drug use.  On a scale of 0-10, with 0 being no symptoms in 10 being the worse, patient reports that her depression is a 3 out of 10 and her anxiety on the same scale is a 2 out of 10. She denies any aggravating factors but reports that her parents being supportive and her being in the PHP has been relieving factors  Patient reports that she is also filled out her college  applications for next academic year. She states that she is still working at [employer] Ambulatory Problems    Diagnosis Date Noted  . Anxiety 08/29/2011  . Severe recurrent major depression without psychotic features ([facility]) 08/29/2011  . ADD (attention deficit disorder) without hyperactivity 05/15/2016  . Tendinopathy of left gluteus medius 07/24/2016  . Idiopathic scoliosis 07/24/2016  . Suicidal ideation 08/20/2016  . MDD (major depressive disorder), recurrent severe, without psychosis ([facility]) 08/20/2016  . Left ankle sprain 09/04/2016   Resolved Ambulatory Problems    Diagnosis Date Noted  . No Resolved Ambulatory Problems   Past Medical History:  Diagnosis Date  . Allergy   . Anxiety   . Asthma   . Depression   . Seasonal allergies    Social History   Social History  . Marital status: Married    Spouse name: N/A  . Number of children: N/A  . Years of education: N/A   Occupational History  . Not on file.   Social History Main Topics  . Smoking status: Never Smoker  . Smokeless tobacco: Never Used  . Alcohol use No  . Drug use:     Types: Marijuana     Comment: once or twice a month  . Sexual activity: No   Other Topics Concern  . Not on file   Social History Narrative  . No narrative on file   Family History  Problem Relation Age of Onset  . Adopted: Yes  . Family history unknown: Yes    Review  of Systems  Constitutional: Negative.  Negative for fever, malaise/fatigue and weight loss.  HENT: Negative.  Negative for congestion, hearing loss and sore throat.   Eyes: Negative.  Negative for blurred vision and redness.  Respiratory: Negative.  Negative for cough, shortness of breath and wheezing.   Cardiovascular: Negative.  Negative for chest pain, palpitations and PND.  Gastrointestinal: Negative.  Negative for abdominal pain, constipation, diarrhea, heartburn, nausea and vomiting.  Genitourinary: Negative.  Negative for dysuria.   Musculoskeletal: Positive for back pain. Negative for falls and joint pain.       Scoliosis  Skin: Negative.  Negative for rash.  Neurological: Negative.  Negative for dizziness, focal weakness, seizures, loss of consciousness, weakness and headaches.  Psychiatric/Behavioral: Positive for depression. Negative for hallucinations, memory loss, substance abuse and suicidal ideas. The patient is not nervous/anxious.    General Appearance: alert, oriented, no acute distress and well nourished  Musculoskeletal: Strength & Muscle Tone: within normal limits Gait & Station: normal Patient leans: N/A Mental Status Examination  Appearance: Casually dressed Alert: Yes Attention: fair  Cooperative: Yes Eye Contact: Fair Speech: Normal in volume, rate, tone, spontaneous  Psychomotor Activity: Normal Memory/Concentration: OK Oriented: person, place and situation Mood: Euthymic Affect: Full Range Thought Processes and Associations: Coherent, Goal Directed and Descriptions of Associations: Intact Fund of Knowledge: Fair Thought Content: Suicidal ideation, Homicidal ideation, Auditory hallucinations, Visual hallucinations, Delusions and Paranoia,none reported Insight: Fair  Judgement: Fair  Diagnosis: Maj. depressive disorder, recurrent in partial remission, generalized anxiety disorder  Treatment Plan: Continue Prozac 40 mg 1 every morning for depression and anxiety Patient currently not taking Focalin XR 10 mg that she's not in college. Patient takes it sporadically when she needs to take a test but plans to start it back when she is back in college See therapist regularly Call when necessary Followup in 2 months  Nelly RoutKUMAR,Kiarrah Rausch, MD

## 2016-09-29 DIAGNOSIS — M419 Scoliosis, unspecified: Secondary | ICD-10-CM | POA: Diagnosis not present

## 2016-10-01 ENCOUNTER — Ambulatory Visit: Payer: Self-pay | Admitting: Sports Medicine

## 2016-10-03 DIAGNOSIS — M419 Scoliosis, unspecified: Secondary | ICD-10-CM | POA: Diagnosis not present

## 2016-10-07 ENCOUNTER — Encounter: Payer: Self-pay | Admitting: Sports Medicine

## 2016-10-07 ENCOUNTER — Ambulatory Visit (INDEPENDENT_AMBULATORY_CARE_PROVIDER_SITE_OTHER): Payer: 59 | Admitting: Sports Medicine

## 2016-10-07 DIAGNOSIS — G4700 Insomnia, unspecified: Secondary | ICD-10-CM | POA: Diagnosis not present

## 2016-10-07 DIAGNOSIS — M67952 Unspecified disorder of synovium and tendon, left thigh: Secondary | ICD-10-CM | POA: Diagnosis not present

## 2016-10-07 DIAGNOSIS — M41124 Adolescent idiopathic scoliosis, thoracic region: Secondary | ICD-10-CM

## 2016-10-07 NOTE — Assessment & Plan Note (Addendum)
As noted above, patient like with a set back related to her exercise restrictions and lack of adequate sleep. Will allow her to do exercises that do not cause her any pain.  Pain is now more generalized and less likely tendinopathy  Strengtth has really improved   Will discuss with patient's psychiatrist about switching from trazodone to amitriptyline.

## 2016-10-07 NOTE — Assessment & Plan Note (Signed)
Would likely benefit from starting amitriptyline 25mg  qhs. Will discuss with patient's psychiatrist who is managing her prozac and trazodone.

## 2016-10-07 NOTE — Assessment & Plan Note (Addendum)
Patient's lack of activity and exercise restriction is likely exacerbating her back pain. Additionally, poor sleep cycles are also contributing.   cotcinue to work with PT - Dr  Julien GirtHalloran  Will allow patient to do exercises that do not cause her pain. Plan for 30 min aerobic per day. Will discuss with patient's psychiatrist about switching from trazodone to amitriptyline for sleep. Follow up 1 month.

## 2016-10-07 NOTE — Progress Notes (Signed)
   Subjective:  Rebekah Tate is a 21 y.o. female who [facility] with a chief complaint of backLucia Estelle pain and hip pain follow up.   HPI:  Back Pain Patient with chronic back pain for the past several months to years related to scoliosis. Patient was seen last month for this. She has seen physical therapy and done her home exercises. She thinks the therapy helps when she is there, but notices that her back "tightens" back up shortly after her appointments. She has regularly been doing her home exercises which help some. She has been fairly limited in the exercises she has been able to do per the advice of her therapist. She is no longer taking mobic. She will occasionally take aleve which helps some. No weakness or numbness.   Hip Pain Patient with subacute hip pain for the last several months. Locat This was initially mostly located on her left hip, though over the past few weeks, she has noticed increased pain in her right hip. The pain in her left hip is worse now as well. Pain occurs randomly.  Insomnia Patient also reports sleeping on 3-4 hours at night with difficulties with sleep onset and frequent awakenings due to her pain. She is taking trazodone 100mg  which does not seem to significantly help.   ROS: Per HPI Denies suicidal sxs/  Says depressed feelings when she does not exercise/ feels down after poor sleep nights  PMH: Smoking history reviewed.   Objective:  Physical Exam: BP 106/60   Ht 5' (1.524 m)   Wt 95 lb (43.1 kg)   BMI 18.55 kg/m   Gen: NAD, resting comfortably MSK: -Back: R scapula sits about 1inch higher than left. Thoracic curvature noted.  Nontender to palpation.  -Hips: No deformities. Tender to palpation over greater trochanters bilaterally. FROM. Strength 5/5 in all directions.  Assessment/Plan:  Idiopathic scoliosis Patient's lack of activity and exercise restriction is likely exacerbating her back pain. Additionally, poor sleep cycles are  also contributing. Will allow patient to do exercises that do not cause her pain. Will discuss with patient's psychiatrist about switching from trazodone to amitriptyline for sleep. Follow up 1 month.   Tendinopathy of left gluteus medius As noted above, patient like with a set back related to her exercise restrictions and lack of adequate sleep. Will allow her to do exercises that do not cause her any pain. Will discuss with patient's psychiatrist about switching from trazodone to amitriptyline.   Insomnia Would likely benefit from starting amitriptyline 25mg  qhs. Will discuss with patient's psychiatrist who is managing her prozac and trazodone.   Rebekah Degreealeb M. Jimmey RalphParker, MD [facility] Resident PGY-3 10/07/2016 4:49 PM   I observed and examined the patient with the resident and agree with assessment and plan.  Note reviewed and modified by me. Rebekah BaasKarl Fields, MD

## 2016-10-15 DIAGNOSIS — M419 Scoliosis, unspecified: Secondary | ICD-10-CM | POA: Diagnosis not present

## 2016-10-16 ENCOUNTER — Ambulatory Visit: Payer: Self-pay | Admitting: Sports Medicine

## 2016-10-17 DIAGNOSIS — F419 Anxiety disorder, unspecified: Secondary | ICD-10-CM | POA: Diagnosis not present

## 2016-10-17 DIAGNOSIS — F329 Major depressive disorder, single episode, unspecified: Secondary | ICD-10-CM | POA: Diagnosis not present

## 2016-10-29 NOTE — Progress Notes (Signed)
Pre visit review using our clinic review tool, if applicable. No additional management support is needed unless otherwise documented below in the visit note.  Chief Complaint  Patient presents with  . Establish Care    HPI: Rebekah Tate 21 y.o. come in for new patient visit To establish with new clinician. Her pediatrician was Dr. HaliLucia Estellena Maidenswila 10. Needs a new PCP. She carries the diagnosis ofmajor depression and scolioss   On ocps    Sees  Behavioral healath and  Op rehab as per Dr. Darrick Pennafields regarding her back pain. She states that she has always battled back pain upper throughout her years since she was younger despite playing sports thought it was normal. However when she saw Dr. Darrick Pennafields recently was noted to have a significant scoliosis and she has been doing outpatient rehabilitation exercise and stretching. However it is still bothersome to her he mentioned a muscle relaxer not sure if that's appropriate. She feels like she has to crack her back a lot. And is an ongoing chronic pain.    Tight  Also .  Side hip pain .  Left .   She states she was diagnosed with depression and put on medication in ninth grade many issues leading up to this sees [name] reports that medication is stable this point although did have an outpatient program last fall that was helpful to her. She sees Dr. Renaldo FiddlerAdkins for GYN care. No chest pain otherwise shortness of breath GI or GU symptoms of the report.  ROS: See pertinent positives and negatives per HPI.  Past Medical History:  Diagnosis Date  . Allergy   . Anxiety   . Asthma    As a child uses as needed albuterol when flares exercise and respiratory infections.  . Depression    meds since 9th grade  dr Lucianne Musskumar  . Scoliosis   . Seasonal allergies     Family History  Problem Relation Age of Onset  . Adopted: Yes  . Family history unknown: Yes    Social History   Social History  . Marital status: Single    Spouse name: N/A  . Number of children: N/A  .  Years of education: N/A   Occupational History  . Student    Social History Main Topics  . Smoking status: Never Smoker  . Smokeless tobacco: Never Used  . Alcohol use No  . Drug use: Yes    Types: Marijuana     Comment: once or twice a month  . Sexual activity: No   Other Topics Concern  . None   Social History Narrative   Adopted from [place] when she was [age] year old. Intact family.   Attended [employer] day school. Went to [employer] for 2 years and didn't like it although good grades early of education.   Applying to other colleges considering state and [employer]   Negative TAD does regular exercise reports 2-3 hours of sleep; lives with parents and a dog at this time feels like she has a healthy situation   GOP 0 last Pap 06/20/2016    Outpatient Medications Prior to Visit  Medication Sig Dispense Refill  . albuterol (PROVENTIL HFA) 108 (90 BASE) MCG/ACT inhaler Inhale 2 puffs into the lungs daily as needed for wheezing or shortness of breath.    . dexmethylphenidate (FOCALIN XR) 10 MG 24 hr capsule Take 1 capsule (10 mg total) by mouth daily. 90 capsule 0  . FLUoxetine (PROZAC) 20 MG capsule     .  FLUoxetine (PROZAC) 40 MG capsule Take 1 capsule (40 mg total) by mouth daily. 90 capsule 0  . Norethindrone Acetate-Ethinyl Estrad-FE (LOESTRIN 24 FE) 1-20 MG-MCG(24) tablet Take 1 tablet by mouth daily.    . traZODone (DESYREL) 50 MG tablet Take 50 mg by mouth at bedtime.   1  . meloxicam (MOBIC) 7.5 MG tablet     . traZODone (DESYREL) 100 MG tablet Take 1 tablet (100 mg total) by mouth at bedtime. 90 tablet 1   No facility-administered medications prior to visit.      EXAM:  BP 110/70 (BP Location: Left Arm, Patient Position: Sitting, Cuff Size: Normal)   Temp 98.7 F (37.1 C) (Oral)   Ht 4' 10.5" (1.486 m)   Wt 99 lb 6.4 oz (45.1 kg)   BMI 20.42 kg/m   Body mass index is 20.42 kg/m.  GENERAL: vitals reviewed and listed above, alert, oriented, appears well hydrated  and in no acute distress HEENT: atraumatic, conjunctiva  clear, no obvious abnormalities on inspection of external nose and ears NECK: no obvious masses on inspection palpation  LUNGS: clear to auscultation bilaterally, no wheezes, rales or rhonchi, good air movement Area o tenderness upper midline  And trap  CV: HRRR, no clubbing cyanosis or  peripheral edema nl cap refill  MS: moves all extremities without noticeable focal  abnormality PSYCH: pleasant and cooperative, well spoken but does appear a bit concerned that no one scoliosis was not diagnosed until recently. Abdomen:  Sof,t normal bowel sounds without hepatosplenomegaly, no guarding rebound or masses no CVA tenderness  Lab Results  Component Value Date   WBC 5.2 08/21/2016   HGB 13.3 08/21/2016   HCT 40.9 08/21/2016   PLT 285 08/21/2016   GLUCOSE 83 08/21/2016   CHOL 157 08/21/2016   TRIG 81 08/21/2016   HDL 75 08/21/2016   LDLCALC 66 08/21/2016   ALT 14 08/21/2016   ALT 15 08/21/2016   AST 20 08/21/2016   AST 19 08/21/2016   NA 139 08/21/2016   K 4.0 08/21/2016   CL 105 08/21/2016   CREATININE 0.87 08/21/2016   BUN 16 08/21/2016   CO2 24 08/21/2016   TSH 2.194 08/21/2016   BP Readings from Last 3 Encounters:  11/10/16 110/70  10/07/16 106/60  09/25/16 110/64    ASSESSMENT AND PLAN:  Discussed the following assessment and plan:  Chronic upper back pain  Adolescent idiopathic scoliosis of thoracic region  MDD (major depressive disorder), recurrent severe, without psychosis (HCC) Reviewed scoliosis. Does not appear to be a surgical problem and nonsurgical modalities will be appropriate for her symptoms. She feels like she is not getting better wonders if she should get prescribed a muscle relaxant as mentioned. She is on other medicines. I will contact Dr. Darrick Penna and get his opinion about other interventions considerations of referral to scoliosis specialist but assured her that most of the time the scoliosis  does not progress at her age. However she feels  that she is feeling physically impaired now that she has a diagnosis of scoliosis. Cannot tell much worry overlay is restricting her function physical function.- Intervention is  physical modalities. To get adequate sleep and appropriate exercise. Plan yearly checkups or as needed in regard to her specific problems. She has a gynecologist to inattentive her GYN health. And she will continue with her psychiatrist. -Patient advised to return or notify health care team  if  new concerns arise.   Patient Instructions  I will contact  Dr  Fields....   About   ideas for further  intervention for you predicament .  An the upper back pain .      I would vote again long term medication  Vs physical modalities .   Healthy lifestyle includes : At least 150 minutes of exercise weeks  , weight at healthy levels, which is usually   BMI 19-25. Avoid trans fats and processed foods;  Increase fresh fruits and veges to 5 servings per day. And avoid sweet beverages including tea and juice. Mediterranean diet with olive oil and nuts have been noted to be heart and brain healthy . Avoid tobacco products . Limit  alcohol to  7 per week for women and 14 servings for men.  Get adequate sleep . Wear seat belts . Don't text and drive .   Yearly check up   Or when needed.     Neta Mends. Rebekah Tate M.D.

## 2016-11-04 DIAGNOSIS — F329 Major depressive disorder, single episode, unspecified: Secondary | ICD-10-CM | POA: Diagnosis not present

## 2016-11-04 DIAGNOSIS — F419 Anxiety disorder, unspecified: Secondary | ICD-10-CM | POA: Diagnosis not present

## 2016-11-05 ENCOUNTER — Telehealth (HOSPITAL_COMMUNITY): Payer: Self-pay | Admitting: Licensed Clinical Social Worker

## 2016-11-06 ENCOUNTER — Telehealth (HOSPITAL_COMMUNITY): Payer: Self-pay | Admitting: Licensed Clinical Social Worker

## 2016-11-06 NOTE — Telephone Encounter (Signed)
Cln called to follow up with patient per [name], regarding beginning therapy services. Pt states that while last week was difficult, she feels it has been problem solved and is on a path to recovery. Pt states she saw her regular therapist yesterday and worked through things. Pt states she has a good relationship with current therapist and will continue with her. Pt denies needing additional services at this time and states she will call if this changes. Pt denies current SI/HI or acute stress.

## 2016-11-10 ENCOUNTER — Encounter: Payer: Self-pay | Admitting: Internal Medicine

## 2016-11-10 ENCOUNTER — Ambulatory Visit (INDEPENDENT_AMBULATORY_CARE_PROVIDER_SITE_OTHER): Payer: 59 | Admitting: Internal Medicine

## 2016-11-10 VITALS — BP 110/70 | Temp 98.7°F | Ht 58.5 in | Wt 99.4 lb

## 2016-11-10 DIAGNOSIS — F332 Major depressive disorder, recurrent severe without psychotic features: Secondary | ICD-10-CM

## 2016-11-10 DIAGNOSIS — G8929 Other chronic pain: Secondary | ICD-10-CM | POA: Diagnosis not present

## 2016-11-10 DIAGNOSIS — M41124 Adolescent idiopathic scoliosis, thoracic region: Secondary | ICD-10-CM

## 2016-11-10 DIAGNOSIS — M549 Dorsalgia, unspecified: Secondary | ICD-10-CM

## 2016-11-10 NOTE — Patient Instructions (Addendum)
I will contact  Dr Darrick PennaFields....   About   ideas for further  intervention for you predicament .  An the upper back pain .      I would vote again long term medication  Vs physical modalities .   Healthy lifestyle includes : At least 150 minutes of exercise weeks  , weight at healthy levels, which is usually   BMI 19-25. Avoid trans fats and processed foods;  Increase fresh fruits and veges to 5 servings per day. And avoid sweet beverages including tea and juice. Mediterranean diet with olive oil and nuts have been noted to be heart and brain healthy . Avoid tobacco products . Limit  alcohol to  7 per week for women and 14 servings for men.  Get adequate sleep . Wear seat belts . Don't text and drive .   Yearly check up   Or when needed.

## 2016-11-17 DIAGNOSIS — F419 Anxiety disorder, unspecified: Secondary | ICD-10-CM | POA: Diagnosis not present

## 2016-11-17 DIAGNOSIS — F329 Major depressive disorder, single episode, unspecified: Secondary | ICD-10-CM | POA: Diagnosis not present

## 2016-11-25 NOTE — Progress Notes (Signed)
Yes, she can take amitriptyline 25mg qhs instead of Trazodone 

## 2016-11-25 NOTE — Progress Notes (Signed)
Yes, she can take amitriptyline 25mg  qhs instead of Trazodone

## 2016-11-27 ENCOUNTER — Ambulatory Visit (HOSPITAL_COMMUNITY): Payer: Self-pay | Admitting: Psychiatry

## 2016-11-27 DIAGNOSIS — M419 Scoliosis, unspecified: Secondary | ICD-10-CM | POA: Diagnosis not present

## 2016-12-09 ENCOUNTER — Ambulatory Visit (INDEPENDENT_AMBULATORY_CARE_PROVIDER_SITE_OTHER): Payer: 59 | Admitting: Sports Medicine

## 2016-12-09 ENCOUNTER — Encounter: Payer: Self-pay | Admitting: Sports Medicine

## 2016-12-09 DIAGNOSIS — M79606 Pain in leg, unspecified: Secondary | ICD-10-CM

## 2016-12-09 DIAGNOSIS — G4700 Insomnia, unspecified: Secondary | ICD-10-CM

## 2016-12-09 DIAGNOSIS — M41124 Adolescent idiopathic scoliosis, thoracic region: Secondary | ICD-10-CM

## 2016-12-09 MED ORDER — AMITRIPTYLINE HCL 50 MG PO TABS: 50.0000 mg | ORAL_TABLET | Freq: Every day | ORAL | 1 refills | Status: DC

## 2016-12-09 NOTE — Progress Notes (Signed)
CC: follow up of back and hip pain  Patient has reduced number of jobs This seems to be helping lessen some of worse pain  Doing PT with Sharen HonesJohn OHalloran and her upper back pain is less particularly when she uses good posture  Still her sleep pattern is poor - 3 hours most night Muscle pain wakes her Trazodone does not seem to help  Prozac has helped depression Feels less depressed particularly with changing jobs to less stressful  Left hip has stopped hurting after HEP and PT Now pain in upper RT HS  ROS Denies depressive feelings today No radicular sxs from back or into hips  PE Thin F who looks fit BP (!) 104/57   Ht 5' (1.524 m)   Wt 96 lb (43.5 kg)   BMI 18.75 kg/m   Scoliosis is unchanged  Less winging of RT scapula Less para-spinous MM spasm  Left hip abduction is now strong - big change  RT Upper HS tight with testing Mild weakness on testing

## 2016-12-09 NOTE — Assessment & Plan Note (Signed)
She is quite athletic an strong  However, I think she gets periodic muscle and tendon injureis as her sleep pattern is poor  I suspect she does not get full recovery  Start HEP for HS weakness and pain/ can lessen some of the hip exercises as that is improved

## 2016-12-09 NOTE — Patient Instructions (Addendum)
Don't take the amitriptyline with the trazodone  Try just the amitriptyline for 2 weeks, continue the prozac as before.  Do the hamstring exercises as on the sheet, 4 times a week.  Do the back exercises and postural work daily  Cut the hip exercises to 2-3 times a week if its helpful  Call us in 2 weeks and let us know if the amitriptyline is working, are you sleeping better from it  We will decide then when to see you back and what to do next

## 2016-12-09 NOTE — Assessment & Plan Note (Signed)
Continue working with PT  Much less spasm today  Needs to work on [employer] for [employer]

## 2016-12-09 NOTE — Assessment & Plan Note (Signed)
Switch to amitriptyline and hold trazodone Start at 50 mg See if we can improve sleep pattern  Monitor for any change in depressive sxs

## 2017-01-08 ENCOUNTER — Other Ambulatory Visit (HOSPITAL_COMMUNITY): Payer: Self-pay | Admitting: Psychiatry

## 2017-01-08 DIAGNOSIS — F3342 Major depressive disorder, recurrent, in full remission: Secondary | ICD-10-CM

## 2017-01-19 DIAGNOSIS — H52222 Regular astigmatism, left eye: Secondary | ICD-10-CM | POA: Diagnosis not present

## 2017-01-19 DIAGNOSIS — H5213 Myopia, bilateral: Secondary | ICD-10-CM | POA: Diagnosis not present

## 2017-01-22 ENCOUNTER — Encounter (HOSPITAL_COMMUNITY): Payer: Self-pay | Admitting: Psychiatry

## 2017-01-22 ENCOUNTER — Ambulatory Visit (INDEPENDENT_AMBULATORY_CARE_PROVIDER_SITE_OTHER): Payer: 59 | Admitting: Psychiatry

## 2017-01-22 VITALS — BP 98/68 | HR 60 | Ht 60.0 in | Wt 97.8 lb

## 2017-01-22 DIAGNOSIS — Z79899 Other long term (current) drug therapy: Secondary | ICD-10-CM

## 2017-01-22 DIAGNOSIS — F1290 Cannabis use, unspecified, uncomplicated: Secondary | ICD-10-CM | POA: Diagnosis not present

## 2017-01-22 DIAGNOSIS — F3342 Major depressive disorder, recurrent, in full remission: Secondary | ICD-10-CM | POA: Diagnosis not present

## 2017-01-22 DIAGNOSIS — F900 Attention-deficit hyperactivity disorder, predominantly inattentive type: Secondary | ICD-10-CM

## 2017-01-22 MED ORDER — FLUOXETINE HCL 40 MG PO CAPS: 40.0000 mg | ORAL_CAPSULE | Freq: Every day | ORAL | 1 refills | Status: DC

## 2017-01-22 NOTE — Progress Notes (Signed)
BH MD/PA/NP OP Progress Note  01/22/2017 1:50 PM KABREA SEENEY  MRN:  [id number]  Chief Complaint:  Chief Complaint    ADD; Depression; Follow-up     Subjective:  I'm doing well, I'm going to [place]  and will be back in August HPI: Patient is a 21 year old female diagnosed with major depressive disorder recurrent, generalized anxiety disorder and ADD inattentive type.  Patient reports that she is doing fairly well, is getting along better with her parents, understands that she and they are different and so have different priorities in life. She adds that they have found a way to get along, reports that she plans to spend the summer in [place], will be back in the first week of August and she start school the second week of August at [employer]. She states that she is found her own place there and plans to move in as soon as she returns back. She has that she is excited about going to [facility], has friends there. She feels that she is much more mature, is making better choices, adds that the [facility] program has helped her greatly in regards to her coping skills, making decisions in her life  On a scale of 0-10 with 0 being no symptoms in 10 being the worse, patient reports her depression and anxiety a 2 out of 10. She currently denies any aggravating or relieving factors. Patient reports that she's no longer having any sleep problems and so does not require the trazodone. She also adds that she is no longer taking amitriptyline as she felt that she would have side effects. She states that she still has scoliosis, does yoga to help with. She denies any alcohol, tobacco or illicit drug use.  She denies any side effects of the medications but does report that she only takes the Focalin XR during the school year. She has that it helps her stay focused and complete her work but reports that sometimes makes her have palpitation associated does not like to take it when she does not have school work today. She  denies any concerns at this visit, any thoughts of hurting herself or others. Visit Diagnosis:    ICD-9-CM ICD-10-CM   1. Attention deficit hyperactivity disorder (ADHD), predominantly inattentive type 314.00 F90.0   2. MDD (major depressive disorder), recurrent, in full remission (HCC) 296.36 F33.42 FLUoxetine (PROZAC) 40 MG capsule    Past Psychiatric History: Patient has had one psychiatric hospitalization the past as an adult, has been in the [facility] program in the past, is currently receiving therapy and also seen by me for medication management  Past Medical History:  Past Medical History:  Diagnosis Date  . Allergy   . Anxiety   . Asthma    As a child uses as needed albuterol when flares exercise and respiratory infections.  . Depression    meds since 9th grade  dr Lucianne Muss  . Scoliosis   . Seasonal allergies     Past Surgical History:  Procedure Laterality Date  . NO PAST SURGERIES      Family Psychiatric History: Patient is adopted  Family History:  Family History  Problem Relation Age of Onset  . Adopted: Yes  . Family history unknown: Yes    Social History:  Social History   Social History  . Marital status: Single    Spouse name: N/A  . Number of children: N/A  . Years of education: N/A   Occupational History  . Student  Social History Main Topics  . Smoking status: Never Smoker  . Smokeless tobacco: Never Used  . Alcohol use No  . Drug use: Yes    Types: Marijuana     Comment: once or twice a month  . Sexual activity: No   Other Topics Concern  . Not on file   Social History Narrative   Adopted from [place] when she was [age] year old. Intact family.   Attended [employer] day [employer]. Went to [employer] for 2 years and didn't like it although good grades early of education.   Applying to other colleges considering state and [employer]   Negative TAD does regular exercise reports 2-3 hours of sleep; lives with parents and a dog at this time feels like she  has a healthy situation   GOP 0 last Pap 06/20/2016   Patient is going to be going to [employer] this fall. She plans to spend the summer in [place] Allergies:  Allergies  Allergen Reactions  . Cat Hair Extract Other (See Comments)    Sneezing, rash, hives    Metabolic Disorder Labs: No results found for: HGBA1C, MPG Lab Results  Component Value Date   PROLACTIN 27.2 (H) 08/21/2016   Lab Results  Component Value Date   CHOL 157 08/21/2016   TRIG 81 08/21/2016   HDL 75 08/21/2016   CHOLHDL 2.1 08/21/2016   VLDL 16 08/21/2016   LDLCALC 66 08/21/2016     Current Medications: Current Outpatient Prescriptions  Medication Sig Dispense Refill  . albuterol (PROVENTIL HFA) 108 (90 BASE) MCG/ACT inhaler Inhale 2 puffs into the lungs daily as needed for wheezing or shortness of breath.    Marland Kitchen amitriptyline (ELAVIL) 50 MG tablet Take 1 tablet (50 mg total) by mouth at bedtime. 60 tablet 1  . dexmethylphenidate (FOCALIN XR) 10 MG 24 hr capsule Take 1 capsule (10 mg total) by mouth daily. 90 capsule 0  . FLUoxetine (PROZAC) 40 MG capsule TAKE 1 CAPSULE BY MOUTH DAILY. 90 capsule 0  . Norethindrone Acetate-Ethinyl Estrad-FE (LOESTRIN 24 FE) 1-20 MG-MCG(24) tablet Take 1 tablet by mouth daily.    . traZODone (DESYREL) 100 MG tablet   1  . traZODone (DESYREL) 50 MG tablet Take 50 mg by mouth at bedtime.   1   No current facility-administered medications for this visit.     Neurologic: Headache: No Seizure: No Paresthesias: No  Musculoskeletal: Strength & Muscle Tone: within normal limits Gait & Station: normal Patient leans: N/A  Psychiatric Specialty Exam: ROS  There were no vitals taken for this visit.There is no height or weight on file to calculate BMI.  General Appearance: Casual  Eye Contact:  Fair  Speech:  Clear and Coherent and Normal Rate  Volume:  Normal  Mood:  Euthymic  Affect:  Congruent and Full Range  Thought Process:  Coherent, Goal Directed and  Descriptions of Associations: Intact  Orientation:  Full (Time, Place, and Person)  Thought Content: WDL   Suicidal Thoughts:  No  Homicidal Thoughts:  No  Memory:  Immediate;   Fair Recent;   Fair Remote;   Fair  Judgement:  Intact  Insight:  Fair  Psychomotor Activity:  Normal  Concentration:  Concentration: Fair and Attention Span: Fair  Recall:  [employer] of Knowledge: Fair  Language: Fair  Akathisia:  No  Handed:  Right  AIMS (if indicated):    Assets:  Communication Skills Desire for Improvement Housing Leisure Time Social Support Transportation  ADL's:  Intact  Cognition: WNL  Sleep:  8 hours     Treatment Plan Summary:Medication management and Plan:  Insomnia: Patient reports that she's sleeping well and so trazodone was discontinued at this visit MDD and generalized anxiety disorder: Continue Prozac 40 mg 1 daily to help with major depressive disorder and generalized anxiety disorder. Patient is stable at this time. ADD inattentive type Patient to restart Focalin XR 10 mg 1 in the morning once school starts in August Scoliosis Patient to follow-up with her sports medicine physician Dr. Darrick Pennafields in regards to her scoliosis Call when necessary Follow-up in 3 months Nelly RoutKUMAR,Torsha Lemus, MD 01/22/2017, 1:50 PM

## 2017-04-09 ENCOUNTER — Ambulatory Visit (INDEPENDENT_AMBULATORY_CARE_PROVIDER_SITE_OTHER): Payer: 59 | Admitting: Psychiatry

## 2017-04-09 ENCOUNTER — Encounter (HOSPITAL_COMMUNITY): Payer: Self-pay | Admitting: Psychiatry

## 2017-04-09 DIAGNOSIS — M419 Scoliosis, unspecified: Secondary | ICD-10-CM

## 2017-04-09 DIAGNOSIS — F3342 Major depressive disorder, recurrent, in full remission: Secondary | ICD-10-CM

## 2017-04-09 DIAGNOSIS — F900 Attention-deficit hyperactivity disorder, predominantly inattentive type: Secondary | ICD-10-CM | POA: Diagnosis not present

## 2017-04-09 DIAGNOSIS — M549 Dorsalgia, unspecified: Secondary | ICD-10-CM | POA: Diagnosis not present

## 2017-04-09 DIAGNOSIS — F411 Generalized anxiety disorder: Secondary | ICD-10-CM | POA: Diagnosis not present

## 2017-04-09 MED ORDER — FLUOXETINE HCL 20 MG PO CAPS: 60.0000 mg | ORAL_CAPSULE | Freq: Every day | ORAL | 3 refills | Status: DC

## 2017-04-09 NOTE — Progress Notes (Signed)
BH MD/PA/NP OP Progress Note  04/09/2017 3:27 PM Rebekah Tate  MRN:  [id number]  Chief Complaint:  Chief Complaint    ADD; Depression; Follow-up     Subjective: I and struggling with motivation, I'm bored, I still haven't paid college fees, done anything about my move to [facility]. HPI: Patient is a 21 year old female diagnosed with major depressive disorder, recurrent, generalized anxiety disorder and ADD inattentive type.  Patient states that it did not work out in [place], she was there for about 2-1/2 weeks, was paid $400 for working from 7 AM to 6 PM, adds that the people she was working for told her they were having financial problems and so she came back home. 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 On being asked to elaborate, patient states that it's not that she is not working but she's not doing anything in regards to getting work done for school such as paying the fees, setting up a moving date, getting her stuff together. She states that they've been paying the rent for the house where she is going to live since July 1 and her mom wants her to work on deciding what day she plans to move to [facility]. She also states that she's eating excessively, has gained 12 pounds, lacks motivation to get anything done for school. She adds that she does not want to see a therapist as she feels seeing a therapist as boring. She states that she is okay with increasing her Prozac to see if it will help with her motivation.  On being questioned about her depression, patient states that she does not enjoy things, does things that she needs to such as working, is bored a lot, stays to herself when she is now working, feels she has no energy but denies any feelings of hopelessness, worthlessness or guilt. She also denies any thoughts of hurting herself or  others. She denies any thoughts of ending her life. She has that she does want to do well at school. She denies any symptoms of mania, psychosis. She also denies any tobacco use, illicit drug use or ethanol use Visit Diagnosis:    ICD-10-CM   1. MDD (major depressive disorder), recurrent, in full remission (HCC) F33.42     Past Psychiatric History: Patient has a history of major depressive disorder, generalized anxiety disorder and ADD inattentive type.  Past Medical History:  Past Medical History:  Diagnosis Date  . Allergy   . Anxiety   . Asthma    As a child uses as needed albuterol when flares exercise and respiratory infections.  . Depression    meds since 9th grade  dr Lucianne Muss  . Scoliosis   . Seasonal allergies     Past Surgical History:  Procedure Laterality Date  . NO PAST SURGERIES      Family Psychiatric History: Patient was adopted  Family History:  Family History  Problem Relation Age of Onset  . Adopted: Yes  . Family history unknown: Yes    Social History: Working as a Public relations account executive, doing various sitting jobs. Patient will be starting at [employer] in the third week of August  Social History   Social History  . Marital status: Single    Spouse name: N/A  . Number of children: N/A  . Years of education: N/A   Occupational History  .  Student    Social History Main Topics  . Smoking status: Never Smoker  . Smokeless tobacco: Never Used  . Alcohol use No  . Drug use: Yes    Types: Marijuana     Comment: once or twice a month  . Sexual activity: No   Other Topics Concern  . None   Social History Narrative   Adopted from [place] when she was [age] year old. Intact family.   Attended [employer] day [employer]. Went to [employer] for 2 years and didn't like it although good grades early of education.   Applying to other colleges considering state and [employer]   Negative TAD does regular exercise reports 2-3 hours of sleep; lives with parents and a dog at this  time feels like she has a healthy situation   GOP 0 last Pap 06/20/2016    Allergies:  Allergies  Allergen Reactions  . Cat Hair Extract Other (See Comments)    Sneezing, rash, hives    Metabolic Disorder Labs: No results found for: HGBA1C, MPG Lab Results  Component Value Date   PROLACTIN 27.2 (H) 08/21/2016   Lab Results  Component Value Date   CHOL 157 08/21/2016   TRIG 81 08/21/2016   HDL 75 08/21/2016   CHOLHDL 2.1 08/21/2016   VLDL 16 08/21/2016   LDLCALC 66 08/21/2016     Current Medications: Current Outpatient Prescriptions  Medication Sig Dispense Refill  . albuterol (PROVENTIL HFA) 108 (90 BASE) MCG/ACT inhaler Inhale 2 puffs into the lungs daily as needed for wheezing or shortness of breath.    . dexmethylphenidate (FOCALIN XR) 10 MG 24 hr capsule Take 1 capsule (10 mg total) by mouth daily. 90 capsule 0  . Norethindrone Acetate-Ethinyl Estrad-FE (LOESTRIN 24 FE) 1-20 MG-MCG(24) tablet Take 1 tablet by mouth daily.    Marland Kitchen. FLUoxetine (PROZAC) 20 MG capsule Take 3 capsules (60 mg total) by mouth daily. 270 capsule 3   No current facility-administered medications for this visit.     Neurologic: Headache: No Seizure: No Paresthesias: No  Musculoskeletal: Strength & Muscle Tone: within normal limits Gait & Station: normal Patient leans: N/A  Psychiatric Specialty Exam: Review of Systems  Constitutional: Negative for fever and malaise/fatigue.  HENT: Negative.  Negative for congestion and sore throat.   Eyes: Negative.  Negative for blurred vision, double vision, discharge and redness.  Respiratory: Negative.  Negative for cough, shortness of breath and wheezing.   Cardiovascular: Negative.  Negative for chest pain and palpitations.  Gastrointestinal: Negative.  Negative for abdominal pain, constipation, diarrhea, heartburn, nausea and vomiting.  Genitourinary: Negative.  Negative for dysuria.  Musculoskeletal: Positive for back pain. Negative for falls,  myalgias and neck pain.  Skin: Negative.  Negative for rash.  Neurological: Negative.  Negative for dizziness, focal weakness, seizures, loss of consciousness, weakness and headaches.  Endo/Heme/Allergies: Negative.  Negative for environmental allergies.  Psychiatric/Behavioral: Positive for depression. Negative for hallucinations, memory loss, substance abuse and suicidal ideas. The patient is not nervous/anxious and does not have insomnia.     There were no vitals taken for this visit.There is no height or weight on file to calculate BMI.  General Appearance: Casual  Eye Contact:  Fair  Speech:  Clear and Coherent and Normal Rate  Volume:  Normal  Mood:  Dysphoric  Affect:  Congruent, Depressed and Full Range  Thought Process:  Coherent, Goal Directed and Descriptions of Associations: Intact  Orientation:  Full (Time, Place, and Person)  Thought Content: Logical and  Rumination   Suicidal Thoughts:  No  Homicidal Thoughts:  No  Memory:  Immediate;   Fair Recent;   Fair Remote;   Fair  Judgement:  Impaired  Insight:  Shallow  Psychomotor Activity:  Normal  Concentration:  Concentration: Fair and Attention Span: Fair  Recall:  [employer] of Knowledge: Fair  Language: Fair  Akathisia:  No  Handed:  Right  AIMS (if indicated):  N/A  Assets:  Desire for Improvement Financial Resources/Insurance Housing Social Support Talents/Skills Transportation  ADL's:  Intact  Cognition: WNL  Sleep:  regular     Treatment Plan Summary:Medication management  MDD and generalized anxiety disorder Increase Prozac to 60 mg daily to help with major depressive disorder. Patient has symptoms of depression, and so an increase in Prozac was done at this visit. Since anxiety stable and the Prozac helps with patient's generalized anxiety disorder. ADD inattentive type  Patient to restart Focalin XR 10 mg 1 in the morning once school starts in the third week of August Scoliosis: Patient does  continue to follow-up with her sports medicine physician in regards to her scoliosis. Discussed the need to do her stretching as requested by the physician which helps with her scoliosis   50% of this visit was spent in discussing the need for patient to see a therapist, work on her motivation, coping skills especially now that she will be moving to a new college. Patient states that she's not willing to see a therapist at this time. Discussed coping strategies at length, also discussed the increased dose of Prozac to help with her depressive symptoms, to work on time Psychologist, educational. This visit was of moderate complexity as safety and crisis was also addressed due to patient endorsing depressive symptoms, coping skills were addressed, and medications were discussed in length at this visit. This visit was a 30 minute appointment Nelly Rout, MD 04/09/2017, 3:27 PM

## 2017-04-30 ENCOUNTER — Ambulatory Visit (INDEPENDENT_AMBULATORY_CARE_PROVIDER_SITE_OTHER): Payer: 59 | Admitting: Psychiatry

## 2017-04-30 ENCOUNTER — Encounter (HOSPITAL_COMMUNITY): Payer: Self-pay | Admitting: Psychiatry

## 2017-04-30 VITALS — BP 102/64 | HR 54 | Ht 60.0 in | Wt 108.6 lb

## 2017-04-30 DIAGNOSIS — F411 Generalized anxiety disorder: Secondary | ICD-10-CM | POA: Diagnosis not present

## 2017-04-30 DIAGNOSIS — F1290 Cannabis use, unspecified, uncomplicated: Secondary | ICD-10-CM | POA: Diagnosis not present

## 2017-04-30 DIAGNOSIS — F900 Attention-deficit hyperactivity disorder, predominantly inattentive type: Secondary | ICD-10-CM | POA: Diagnosis not present

## 2017-04-30 DIAGNOSIS — F3342 Major depressive disorder, recurrent, in full remission: Secondary | ICD-10-CM

## 2017-04-30 DIAGNOSIS — M419 Scoliosis, unspecified: Secondary | ICD-10-CM | POA: Diagnosis not present

## 2017-04-30 MED ORDER — DEXMETHYLPHENIDATE HCL ER 10 MG PO CP24: 10.0000 mg | ORAL_CAPSULE | Freq: Every day | ORAL | 0 refills | Status: DC

## 2017-04-30 NOTE — Progress Notes (Signed)
BH MD/PA/NP OP Progress Note  04/30/2017 2:58 PM Rebekah Tate  MRN:  [id number]  Chief Complaint:  Subjective:  I still struggle with my motivation, and not interested in doing anything but I'm going to the beach with a friend of mine  HPI: Patient is a 21 year old female diagnosed with major depressive disorder, recurrent, generalized anxiety disorder and ADD inattentive type.  Patient states that she is bored a lot, does not seem to enjoy activities but adds that she does not try to motivate herself to do much. She does report taking her medications as prescribed, denies any side effects with the increased dose of Prozac. She states that she is moving into her apartment at [facility] and will be going to school there. She adds that she's not been exercising, has just been eating and sleeping. On being questioned if she was working, patient states that she does do jobs here and there. She has that she still close to some of her friends, reports that she is going to the beach with one of for friends. On being questioned about her move to school, patient states that she is moving next Thursday.  Patient denies any feelings of hopelessness, worthlessness or guilt. She denies any symptoms of anxiety, any problems with sleep but does report she feels she is sleeping excessively. On being questioned about her interests, patient states that she's not been running due to her back problem, has not found anything else she enjoys doing. Patient states that she wants to do well at [employer], needs to get motivated about her schedule, her classes. She denies any psychotic symptoms, any suicidal or homicidal thoughts, any side effects of the medications, any symptoms of mania Visit Diagnosis:    ICD-10-CM   1. MDD (major depressive disorder), recurrent, in full remission (HCC) F33.42   2. Attention deficit hyperactivity disorder (ADHD), predominantly inattentive type F90.0 dexmethylphenidate (FOCALIN XR) 10 MG  24 hr capsule  3. GAD (generalized anxiety disorder) F41.1     Past Psychiatric History: Unchanged from previous visit  Past Medical History:  Past Medical History:  Diagnosis Date  . Allergy   . Anxiety   . Asthma    As a child uses as needed albuterol when flares exercise and respiratory infections.  . Depression    meds since 9th grade  dr Lucianne Muss  . Scoliosis   . Seasonal allergies     Past Surgical History:  Procedure Laterality Date  . NO PAST SURGERIES      Family Psychiatric History: Patient was adopted  Family History:  Family History  Problem Relation Age of Onset  . Adopted: Yes  . Family history unknown: Yes    Social History:  Social History   Social History  . Marital status: Single    Spouse name: N/A  . Number of children: N/A  . Years of education: N/A   Occupational History  . Student    Social History Main Topics  . Smoking status: Never Smoker  . Smokeless tobacco: Never Used  . Alcohol use No  . Drug use: Yes    Types: Marijuana     Comment: once or twice a month  . Sexual activity: No   Other Topics Concern  . None   Social History Narrative   Adopted from [place] when she was [age] year old. Intact family.   Attended [employer] day [employer]. Went to [employer] for 2 years and didn't like it although good grades early of  education.   Applying to other colleges considering state and [employer]   Negative TAD does regular exercise reports 2-3 hours of sleep; lives with parents and a dog at this time feels like she has a healthy situation   GOP 0 last Pap 06/20/2016    Allergies:  Allergies  Allergen Reactions  . Cat Hair Extract Other (See Comments)    Sneezing, rash, hives    Metabolic Disorder Labs: No results found for: HGBA1C, MPG Lab Results  Component Value Date   PROLACTIN 27.2 (H) 08/21/2016   Lab Results  Component Value Date   CHOL 157 08/21/2016   TRIG 81 08/21/2016   HDL 75 08/21/2016   CHOLHDL 2.1 08/21/2016   VLDL  16 08/21/2016   LDLCALC 66 08/21/2016     Current Medications: Current Outpatient Prescriptions  Medication Sig Dispense Refill  . albuterol (PROVENTIL HFA) 108 (90 BASE) MCG/ACT inhaler Inhale 2 puffs into the lungs daily as needed for wheezing or shortness of breath.    . dexmethylphenidate (FOCALIN XR) 10 MG 24 hr capsule Take 1 capsule (10 mg total) by mouth daily. 90 capsule 0  . FLUoxetine (PROZAC) 20 MG capsule Take 3 capsules (60 mg total) by mouth daily. 270 capsule 3  . Norethindrone Acetate-Ethinyl Estrad-FE (LOESTRIN 24 FE) 1-20 MG-MCG(24) tablet Take 1 tablet by mouth daily.     No current facility-administered medications for this visit.     Neurologic: Headache: No Seizure: No Paresthesias: No  Musculoskeletal: Strength & Muscle Tone: within normal limits Gait & Station: normal Patient leans: N/A  Psychiatric Specialty Exam: ROS  There were no vitals taken for this visit.There is no height or weight on file to calculate BMI.  General Appearance: Casual  Eye Contact:  Fair  Speech:  Clear and Coherent and Normal Rate  Volume:  Normal  Mood:  Depressed and Dysphoric  Affect:  Non-Congruent and Full Range  Thought Process:  Coherent, Goal Directed and Descriptions of Associations: Intact  Orientation:  Full (Time, Place, and Person)  Thought Content: WDL   Suicidal Thoughts:  No  Homicidal Thoughts:  No  Memory:  Immediate;   Fair Recent;   Fair Remote;   Fair  Judgement:  Impaired  Insight:  Shallow  Psychomotor Activity:  Normal  Concentration:  Concentration: Fair and Attention Span: Fair  Recall:  [employer] of Knowledge: Fair  Language: Fair  Akathisia:  No  Handed:  Right  AIMS (if indicated):  N/A  Assets:  Financial Resources/Insurance Housing Physical Health Social Support Talents/Skills Transportation  ADL's:  Intact  Cognition: WNL  Sleep:  10 hours     Treatment Plan Summary:Medication management Major depressive disorder and  generalized anxiety disorder Continue Prozac 60 mg daily to help with depression and anxiety. Discussed coping skills in length with patient at this visit, also discussed the need to make a plan and try and follow a schedule. Discussed other medication options over the next 6 weeks if patient does not have any benefit with Prozac, coping skills and structured plans for patient for her day ADD inattentive type To restart Focalin XL 10 mg 1 in the morning once school starts Scoliosis  To continue to follow up with sports medicine doctor in regards to her scoliosis 50% of this visit was spent in discussing coping strategies, the need to have her day structured, the need for her to work on her social communication skills, along with discussing all medication treatment options in regards to her  depression. The patient felt that if she did not respond to the Prozac in the next 4-6 weeks, once school is started, we would look at switching patient to another antidepressant. Patient still not willing to see a therapist. Patient denied any suicidal ideation, states that she has [facility] [place] and feels that she will do better there than she did at [employer] socially. This visit was of moderate complexity and exceeded 30 minutes  Nelly RoutKUMAR,Alene Bergerson, MD 04/30/2017, 2:58 PM

## 2017-05-21 ENCOUNTER — Encounter (HOSPITAL_COMMUNITY): Payer: Self-pay | Admitting: Psychiatry

## 2017-05-21 ENCOUNTER — Ambulatory Visit (INDEPENDENT_AMBULATORY_CARE_PROVIDER_SITE_OTHER): Payer: 59 | Admitting: Psychiatry

## 2017-05-21 VITALS — BP 104/66 | HR 73 | Ht 60.0 in | Wt 113.0 lb

## 2017-05-21 DIAGNOSIS — F411 Generalized anxiety disorder: Secondary | ICD-10-CM

## 2017-05-21 DIAGNOSIS — R45851 Suicidal ideations: Secondary | ICD-10-CM | POA: Diagnosis not present

## 2017-05-21 DIAGNOSIS — F901 Attention-deficit hyperactivity disorder, predominantly hyperactive type: Secondary | ICD-10-CM | POA: Diagnosis not present

## 2017-05-21 DIAGNOSIS — G4700 Insomnia, unspecified: Secondary | ICD-10-CM | POA: Diagnosis not present

## 2017-05-21 DIAGNOSIS — F332 Major depressive disorder, recurrent severe without psychotic features: Secondary | ICD-10-CM

## 2017-05-21 DIAGNOSIS — F900 Attention-deficit hyperactivity disorder, predominantly inattentive type: Secondary | ICD-10-CM

## 2017-05-21 NOTE — Progress Notes (Addendum)
BH MD/PA/NP OP Progress Note  05/21/2017 3:12 PM Lucia EstelleKathryn M Bitner  MRN:  [id number]  Chief Complaint:  Chief Complaint    Depression; ADD; suicidal ideation     HPI: patient is a 21 year old diagnosed with major depressive disorder, recurrent, generalized anxiety disorder and ADD inattentive type who presents today for follow-up visit Patient reports that she's not been able to cope with her depression, adds that she attended one week of college, felt overwhelmed, could not understand the work, felt she did not socially fit in and so did not want to stay at school. She adds that she also had increased suicidal thoughts, states that she can contract for safety as she is now plans and acting on those thoughts .on a scale of 0-10, with 0 being no symptoms in 10 being the worst, patient reports that her depression currently is a 9 out of 10. She has that she self isolating herself, feels hopeless, worthless, has no motivation, staying mostly to herself, and feels that life is not worth living. She states that she'll talk to mom if she feels that the suicidal thoughts overwhelming, adds [facility] and did not like inpatient. Patient states that she is okay with coming off the Prozac and trying another antidepressant. She has that she knows that the medication by itself is not going to help with her depression and that she needs to attend PHP and see an individual therapist after she completes the program . Patient states that she is frustrated with herself, wants a college education, does not know how to make things better. She has that she was taking notes, pushing herself to attend classes but was unable to cope because of her depression. Patient states that she does want to get better, complete college as she feels without a college education she is going to be a failure.   Patient denies any psychotic symptoms, any homicidal ideation, any paranoia, any symptoms of mania.   Visit Diagnosis:    ICD-10-CM   1. Severe recurrent major depression without psychotic features (HCC) F33.2   2. Attention deficit hyperactivity disorder (ADHD), predominantly inattentive type F90.0   3. Suicidal ideation R45.851     Past Psychiatric History: Unchanged from previous visit except that patient's had suicidal thoughts increase over the past 1 week   Past Medical History:  Past Medical History:  Diagnosis Date  . Allergy   . Anxiety   . Asthma    As a child uses as needed albuterol when flares exercise and respiratory infections.  . Depression    meds since 9th grade  dr Lucianne Musskumar  . Scoliosis   . Seasonal allergies     Past Surgical History:  Procedure Laterality Date  . NO PAST SURGERIES      Family Psychiatric History: Unchanged as patient is adopted   Family History:  Family History  Problem Relation Age of Onset  . Adopted: Yes  . Family history unknown: Yes    Social History:  Social History   Social History  . Marital status: Single    Spouse name: N/A  . Number of children: N/A  . Years of education: N/A   Occupational History  . Student    Social History Main Topics  . Smoking status: Never Smoker  . Smokeless tobacco: Never Used  . Alcohol use No  . Drug use: No  . Sexual activity: No   Other Topics Concern  . None   Social  History Narrative   Adopted from [place] when she was [age] year old. Intact family.   Attended [employer] day school. Went to [employer] for 2 years and didn't like it although good grades early of education.   Applying to other colleges considering state and [employer]   Negative TAD does regular exercise reports 2-3 hours of sleep; lives with parents and a dog at this time feels like she has a healthy situation   GOP 0 last Pap 06/20/2016    Allergies:  Allergies  Allergen Reactions  . Cat Hair Extract Other (See Comments)    Sneezing, rash, hives    Metabolic Disorder Labs: No results found for: HGBA1C,  MPG Lab Results  Component Value Date   PROLACTIN 27.2 (H) 08/21/2016   Lab Results  Component Value Date   CHOL 157 08/21/2016   TRIG 81 08/21/2016   HDL 75 08/21/2016   CHOLHDL 2.1 08/21/2016   VLDL 16 08/21/2016   LDLCALC 66 08/21/2016   Lab Results  Component Value Date   TSH 2.194 08/21/2016    Therapeutic Level Labs: No results found for: LITHIUM No results found for: VALPROATE No components found for:  CBMZ  Current Medications: Current Outpatient Prescriptions  Medication Sig Dispense Refill  . albuterol (PROVENTIL HFA) 108 (90 BASE) MCG/ACT inhaler Inhale 2 puffs into the lungs daily as needed for wheezing or shortness of breath.    Marland Kitchen FLUoxetine (PROZAC) 20 MG capsule Take 3 capsules (60 mg total) by mouth daily. 270 capsule 3  . Norethindrone Acetate-Ethinyl Estrad-FE (LOESTRIN 24 FE) 1-20 MG-MCG(24) tablet Take 1 tablet by mouth daily.    Marland Kitchen dexmethylphenidate (FOCALIN XR) 10 MG 24 hr capsule Take 1 capsule (10 mg total) by mouth daily. (Patient not taking: Reported on 05/21/2017) 90 capsule 0   No current facility-administered medications for this visit.      Musculoskeletal: Strength & Muscle Tone: within normal limits Gait & Station: normal Patient leans: N/A  Psychiatric Specialty Exam: Review of Systems  Constitutional: Negative.  Negative for chills, fever and malaise/fatigue.  HENT: Negative.  Negative for congestion and sore throat.   Eyes: Negative.  Negative for blurred vision, double vision, discharge and redness.  Respiratory: Negative.  Negative for cough, shortness of breath and wheezing.   Cardiovascular: Negative.  Negative for chest pain and palpitations.  Gastrointestinal: Negative.  Negative for abdominal pain, diarrhea, heartburn, nausea and vomiting.  Skin:       Patient picking on skin of her nailbeds  Neurological: Negative for dizziness, seizures, loss of consciousness, weakness and headaches.  Endo/Heme/Allergies: Negative.   Negative for environmental allergies.  Psychiatric/Behavioral: Positive for depression and suicidal ideas. Negative for hallucinations, memory loss and substance abuse. The patient has insomnia. The patient is not nervous/anxious.     Blood pressure 104/66, pulse 73, height 5' (1.524 m), weight 113 lb (51.3 kg), SpO2 98 %.Body mass index is 22.07 kg/m.  General Appearance: Casual and Tearful, upset  Eye Contact:  Fair  Speech:  Clear and Coherent and Normal Rate  Volume:  Normal  Mood:  Angry, Depressed, Dysphoric, Hopeless and Worthless  Affect:  Congruent, Constricted and Depressed  Thought Process:  Coherent and Descriptions of Associations: Intact  Orientation:  Full (Time, Place, and Person)  Thought Content: Rumination   Suicidal Thoughts:  Yes.  without intent/plan  Homicidal Thoughts:  No  Memory:  Immediate;   Fair Recent;   Fair Remote;   Fair  Judgement:  Poor  Insight:  Shallow  Psychomotor Activity:  Mannerisms  Concentration:  Concentration: Fair and Attention Span: Fair  Recall:  [employer] of Knowledge: Fair  Language: Fair  Akathisia:  No  Handed:  Right  AIMS (if indicated): not done  Assets:  Desire for Improvement Financial Resources/Insurance Housing Physical Health Social Support Transportation  ADL's:  Intact  Cognition: WNL  Sleep:  Fair   Screenings: AIMS     [facility] (Dis[facility]) [facility]  AIMS Total Score  0    AUDIT     Admission (Discharged) from [facility]  Alcohol Use Disorder Identification Test Final Score (AUDIT)  0    MDI     [facility]sel[facility]  Total Score (max 50)  9    PHQ2-9     Office Visit from 12/09/2016 in [facility] Office Visit from 10/07/2016 in [facility] Office Visit from 02/25/2015  in [facility] Total Score  0  0  0       Assessment and Plan:  Major depressive disorder and generalized anxiety disorder To discontinue Prozac. Patient had swab done for genetic testing to help decide on an antidepressant that'll help patient To start trazodone 50 mg at bedtime as needed for sleep. ADD inattentive type To discontinue Focalin XL 10 mg as the patient's no longer in school  Discussed crisis and safety plan in length with patient and mom at this visit. Discussed patient's starting PHP program as she is willing to contract for safety. Also discussed BH H walk-in, suicide Hotline if needed. Patient to start PHP program tomorrow.   This visit exceeded 35 minutes, patient was seen urgently due to her worsening of depression, suicidal ideation.  Nelly Rout, MD 05/21/2017, 3:12 PM

## 2017-05-22 ENCOUNTER — Encounter (HOSPITAL_COMMUNITY): Payer: Self-pay | Admitting: Professional

## 2017-05-22 ENCOUNTER — Other Ambulatory Visit (HOSPITAL_COMMUNITY): Payer: 59 | Attending: Psychiatry | Admitting: Licensed Clinical Social Worker

## 2017-05-22 VITALS — BP 106/68 | HR 66 | Ht 60.0 in | Wt 112.0 lb

## 2017-05-22 DIAGNOSIS — F411 Generalized anxiety disorder: Secondary | ICD-10-CM | POA: Diagnosis not present

## 2017-05-22 DIAGNOSIS — F332 Major depressive disorder, recurrent severe without psychotic features: Secondary | ICD-10-CM | POA: Insufficient documentation

## 2017-05-22 NOTE — Progress Notes (Signed)
Met with patient as she presented with flat affect, depressed mood and reported her current level of depression an 8, anxiety a 5, and hopelessness an 8 on a scale of 0-10 with 0 being none and 10 the worst it could be.  Patient denied any current suicidal or homicidal ideations with no plan or intent to harm self but admitted she was glad she was home after having to leave [facility] after 2 weeks and the increased anxiety this transition had caused. Patient scored a 21 on her PHQ9, reported she had been getting more depressed over the past month, sleeping too much and overeating.  Patient discussed current medications and agrees to give PHP a try to learn new coping skills and to work with Dr. Daron Offer to manage medications. Patient stable at this time and has plan to keep self safe with family over the coming weekend.

## 2017-05-22 NOTE — Psych (Signed)
Comprehensive Clinical Assessment (CCA) Note  05/22/2017 Rebekah Tate [id number]  Visit Diagnosis:      ICD-10-CM   1. Severe recurrent major depression without psychotic features (HCC) F33.2   2. GAD (generalized anxiety disorder) F41.1       CCA Part One  Part One has been completed on paper by the patient.  (See scanned document in Chart Review)  CCA Part Two A  Intake/Chief Complaint:  CCA Intake With Chief Complaint CCA Part Two Date: 05/22/17 CCA Part Two Time: 1000 Chief Complaint/Presenting Problem: Pt was referred by [name].  Pt reports she went to college for 1.5 weeks and was unable to comprehend anything said in class.  Pt states she has lost interest in everything she used to enjoy.  Pt reports she is sleeping more than she usually does and wants to sleep all the time.  Pt states she is having SI, no HI.  Pt reports she has gained 20lbs in the last 4 months because she does not exercise.  Pt states "I don't like what I see in the mirror now."  Pt states she is stressed about not being able to do school.  Pt reports she has been experiencing these depression symptoms since "mid-summer."  Patients Currently Reported Symptoms/Problems: Pt reports depressed mood,  SI, anhedonia, low motivation, emotional numbness, critical self talk, constant worry, weight gain, increase sleep, concentration/memory problems, low energy Individual's Strengths: Pt reports strong support in her parents and safe home environment.   Mental Health Symptoms Depression:  Depression: Change in energy/activity, Fatigue, Hopelessness, Sleep (too much or little), Worthlessness, Difficulty Concentrating, Weight gain/loss, Increase/decrease in appetite  Mania:     Anxiety:   Anxiety: Restlessness, Sleep, Tension, Worrying, Fatigue, Difficulty concentrating  Psychosis:     Trauma:     Obsessions:     Compulsions:     Inattention:     Hyperactivity/Impulsivity:     Oppositional/Defiant Behaviors:      Borderline Personality:     Other Mood/Personality Symptoms:      Mental Status Exam Appearance and self-care  Stature:  Stature: Average  Weight:  Weight: Average weight  Clothing:  Clothing: Casual  Grooming:  Grooming: Normal  Cosmetic use:  Cosmetic Use: Age appropriate  Posture/gait:  Posture/Gait: Normal  Motor activity:  Motor Activity: Not Remarkable  Sensorium  Attention:  Attention: Normal  Concentration:  Concentration: Normal  Orientation:  Orientation: X5  Recall/memory:  Recall/Memory: Normal  Affect and Mood  Affect:  Affect: Depressed  Mood:  Mood: Depressed  Relating  Eye contact:  Eye Contact: Normal  Facial expression:  Facial Expression: Depressed  Attitude toward examiner:  Attitude Toward Examiner: Cooperative, Guarded  Thought and Language  Speech flow: Speech Flow: Normal  Thought content:  Thought Content: Appropriate to mood and circumstances  Preoccupation:     Hallucinations:     Organization:     Company secretaryxecutive Functions  Fund of Knowledge:  Fund of Knowledge: Average  Intelligence:  Intelligence: Average  Abstraction:  Abstraction: Normal  Judgement:  Judgement: Fair  Dance movement psychotherapisteality Testing:  Reality Testing: Adequate  Insight:  Insight: Fair  Decision Making:  Decision Making: Normal  Social Functioning  Social Maturity:     Social Judgement:     Stress  Stressors:  Stressors: Transitions  Coping Ability:  Coping Ability: Horticulturist, commercialxhausted  Skill Deficits:     Supports:      Family and Psychosocial History: Family history Marital status: Single Does patient have children?: No  Childhood History:  Childhood History By whom was/is the patient raised?: Adoptive parents (Pt reports she was raised by both adoptive parents who she has been with since age 32. ) Additional childhood history information: adopted at age 32 Description of patient's relationship with caregiver when they were a child: parents were very supportive Does patient have siblings?:  No Did patient suffer any verbal/emotional/physical/sexual abuse as a child?: No Did patient suffer from severe childhood neglect?: No Has patient ever been sexually abused/assaulted/raped as an adolescent or adult?: No Witnessed domestic violence?: No Has patient been effected by domestic violence as an adult?: No  CCA Part Two B  Employment/Work Situation: Employment / Work Psychologist, occupational Employment situation: Consulting civil engineer Where is patient currently employed?: Pt has medically withdrawn from [employer] Patient's job has been impacted by current illness: Yes Describe how patient's job has been impacted: Pt had to withdraw from classes because "I couldn't comprehend what was going on in class." What is the longest time patient has a held a job?: summ[employer] from 9th-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 Has patient ever been in the [employer]?: No Has patient ever served in combat?: No Did You Receive Any Psychiatric Treatment/Services While in Equities trader?: No Are There Guns or Other Weapons in Your Home?: No  Education: Education Last Grade Completed: 14 Did Garment/textile technologist From [employer]?: Yes Did Theme park manager?: Yes (Pt reports she attended [employer] for 2 years and just withdrew from [employer]) Did You Attend Graduate School?: No Did You Have An Individualized Education Program (IIEP): No Did You Have Any Difficulty At [employer]?: Yes (Pt reports she can't concentrate)  Religion: Religion/Spirituality Are You A Religious Person?: No  Leisure/Recreation: Leisure / Recreation Leisure and Hobbies: sleeping; coaching swimming; working out; reading  Exercise/Diet: Exercise/Diet Do You Exercise?: No (Pt reports she used to exercise daily but now does not) Have You Gained or Lost A Significant Amount of Weight in the Past Six Months?: Yes-Gained Number of Pounds Gained: 20 Do You Follow a Special Diet?: No Do You Have Any Trouble Sleeping?:  Yes Explanation of Sleeping Difficulties: Pt reports she is oversleeping and still feels tired when she wakes up  CCA Part Two C  Alcohol/Drug Use: Alcohol / Drug Use Pain Medications: see MAR Prescriptions: See MAR Over the Counter: see MAR History of alcohol / drug use?: No history of alcohol / drug abuse    CCA Part Three  ASAM's:  Six Dimensions of Multidimensional Assessment  Dimension 1:  Acute Intoxication and/or Withdrawal Potential:     Dimension 2:  Biomedical Conditions and Complications:     Dimension 3:  Emotional, Behavioral, or Cognitive Conditions and Complications:     Dimension 4:  Readiness to Change:     Dimension 5:  Relapse, Continued use, or Continued Problem Potential:     Dimension 6:  Recovery/Living Environment:      Substance use Disorder (SUD)    Social Function:     Stress:  Stress Stressors: Transitions Coping Ability: Exhausted Patient Takes Medications The Way The Doctor Instructed?: Yes Priority Risk: High Risk  Risk Assessment- Self-Harm Potential: Risk Assessment For Self-Harm Potential Thoughts of Self-Harm: Vague current thoughts Method: No plan Additional Information for Self-Harm Potential: Acts of Self-harm (hospitalization for SI in 12/17) Additional Comments for Self-Harm Potential: Pt reports thinking about suicide 1-2x daily but "I am not going to act on anything"; Pt denies having a plan  Risk Assessment -Dangerous to Others  Potential: Risk Assessment For Dangerous to Others Potential Method: No Plan  DSM5 Diagnoses: Patient Active Problem List   Diagnosis Date Noted  . Insomnia 10/07/2016  . Left ankle sprain 09/04/2016  . Suicidal ideation 08/20/2016  . MDD (major depressive disorder), recurrent severe, without psychosis (HCC) 08/20/2016  . Lower extremity pain 07/24/2016  . Idiopathic scoliosis 07/24/2016  . ADD (attention deficit disorder) without hyperactivity 05/15/2016  . Anxiety 08/29/2011  . Severe  recurrent major depression without psychotic features (HCC) 08/29/2011    Patient Centered Plan: Patient is on the following Treatment Plan(s):  Depression  Recommendations for Services/Supports/Treatments: Recommendations for Services/Supports/Treatments Recommendations For Services/Supports/Treatments: Partial Hospitalization (Pt is experiencing SI.  Pt needs to increase coping skills, distress tolerance skills, and emotional regulation)  Treatment Plan Summary: OP Treatment Plan Summary: Pt states "I want to feel better like I was"  Referrals to Alternative Service(s): Referred to Alternative Service(s):   Place:   Date:   Time:    Referred to Alternative Service(s):   Place:   Date:   Time:    Referred to Alternative Service(s):   Place:   Date:   Time:    Referred to Alternative Service(s):   Place:   Date:   Time:     Quinn Axe, LPCA

## 2017-05-26 ENCOUNTER — Other Ambulatory Visit (HOSPITAL_COMMUNITY): Payer: 59 | Attending: Psychiatry | Admitting: Occupational Therapy

## 2017-05-26 ENCOUNTER — Other Ambulatory Visit (HOSPITAL_COMMUNITY): Payer: 59 | Admitting: Licensed Clinical Social Worker

## 2017-05-26 DIAGNOSIS — F332 Major depressive disorder, recurrent severe without psychotic features: Secondary | ICD-10-CM | POA: Insufficient documentation

## 2017-05-26 DIAGNOSIS — F411 Generalized anxiety disorder: Secondary | ICD-10-CM

## 2017-05-26 NOTE — Psych (Signed)
   [facility]  Rebekah EstelleKathryn M Vert [id number]  Session Time: 9- 1  Participation Level: Minimal  Behavioral Response: CasualAlertAnxious and Depressed  Type of Therapy: Group Therapy, Psychotherapy, Psychoeducation, Activity Therapy  Treatment Goals addressed: Coping  Interventions: CBT, DBT, Supportive and Reframing  Summary:  9:00 - 10:00 Clinician led check-in regarding current stressors and situation, and review of patient completed daily inventory. Clinician utilized active listening and empathetic response and validated patient emotions. Clinician facilitated processing group on pertinent issues.  10:00 -11:00: Clinician introduced topic of "Positive Psychology". Group watched "Positive Psychology" Ted-Talk. Patients discussed how their "lens" of life effects the way they feel. Patients identified two strategies they would be willing to try to change their "lens." 11:00 - 12:00: Clinician led psychotherapy group on "11 beliefs that make life better" and group discussed the ways in which distorted thinking play into their perception of reality.  12:00 - 12:50 Clinician led gratitude activity.  12:50 - 1:00 Clinician led check-out. Clinician assessed for immediate needs, medication compliance and efficacy, and safety concerns.    Suicidal/Homicidal: Nowithout intent/plan  Therapist Response: Rebekah Tate is a 21 y.o. female who presents with depression symptoms. Patient arrived within time allowed and reports she is feeling "upset that I'm back here." Patient rates her mood at a 4 on a 1- 10 scale with 10 being great. Pt reports having some SI last night and managing the thoughts by reading and petting her dog. Pt shares that she is feeling depressed and feels like her previous forms of coping are not working. Patient engaged in activity and discussion. Patient demonstrates some progress as evidenced by attending group. Patient denies SI/HI/self-harm thoughts at  end of group.    Plan: Patient will continue in PHP and medication management. Work towards decreasing depression symptoms and increase emotional regulation and positive coping skills.    Diagnosis: Severe recurrent major depression without psychotic features (HCC) [F33.2]    1. Severe recurrent major depression without psychotic features (HCC)   2. GAD (generalized anxiety disorder)       Rebekah GuilesJenny Isadore Bokhari, LCSW 05/26/2017

## 2017-05-26 NOTE — Therapy (Signed)
[facility] PARTIAL HOSPITALIZATION PROGRAM [address] [place], [place], [address] Phone: [phone]   Fax:  [phone]  Occupational Therapy Evaluation & Treatment  Patient Details  Name: Rebekah Tate MRN: [id number] Date of Birth: 1996-08-26 Referring Provider: Dr. Carolanne Grumbling  Encounter Date: 05/26/2017      OT End of Session - 05/26/17 1255    Visit Number 1   Number of Visits 6   Date for OT Re-Evaluation 06/16/17   Authorization Type UMR   OT Start Time 1030   OT Stop Time 1130   OT Time Calculation (min) 60 min   Activity Tolerance Patient tolerated treatment well   Behavior During Therapy [facility] for tasks assessed/performed      Past Medical History:  Diagnosis Date  . Allergy   . Anxiety   . Asthma    As a child uses as needed albuterol when flares exercise and respiratory infections.  . Depression    meds since 9th grade  dr Lucianne Muss  . Scoliosis   . Seasonal allergies     Past Surgical History:  Procedure Laterality Date  . NO PAST SURGERIES      There were no vitals filed for this visit.      Subjective Assessment - 05/26/17 1255    Currently in Pain? No/denies           [facility] OT Assessment - 05/26/17 1255      Assessment   Diagnosis Major Depressive Disorder   Referring Provider Dr. Carolanne Grumbling   Onset Date --  chronic     Precautions   Precautions None     Balance Screen   Has the patient fallen in the past 6 months No   Has the patient had a decrease in activity level because of a fear of falling?  No   Is the patient reluctant to leave their home because of a fear of falling?  No        OT assessment:  Diagnosis:  MDD Past medical history: GAD, OCD, multiple hospitalizations for MDD/SI Living situation:  parents ADLs:  Independent Work:  Does not work; attended [employer] for 2 years, recently dropped out of [employer] after 1.5 weeks Leisure: used to enjoy exercise-has gained 20lbs due to not  exercising Social support: parents Struggles:  depression, anxiety, sleeping too much, difficulty concentrating OT goal:  Transport planner Causality Orientation Scale    Subscore Percentile Score  Autonomy 55 54.53  Control 36 9.77  Impersonal 63 93.77    Motivation Type  Motivation type Explanation    Impersonal/amotivational  There is a lack of connection between any of the individual's behavior and his or her personal goals. The individual is likely in a passivity state or manifests non-goal-directed behavior. This is considered a type of motivational deficit and warrants motivational interventions.        Assessment:  Patient demonstrates impersonal/amotivational behaviors. This is considered a type of motivational deficit and warrants motivational interventions. Patient will benefit from occupational therapy intervention in order to improve time management, financial management, stress management, job readiness skills, social skills, sleep hygiene, exercise and healthy eating habits, and health management skills and other psychosocial skills needed for preparation to return to full time community living and to be a productive community member.     Plan:  Patient will participate in skilled occupational therapy sessions individually or in a group setting to improve coping skills, psychosocial skills, and emotional  skills required to return to prior level of function as a productive community member. Treatment will be 1-2 times per week for 2-6 weeks.         OT Treatment Session: Time Management    S: "I used to have good time management skills."  O: Time management session completed with emphasis on skills required, effective versus ineffective time management, importance of structure, along with tips and strategies for success. Patient provided with education on the effects of time management in regards to improving physical, emotional, and social well-being  including improved ability for focus, decision-making skills, success in school, work, and social commitments, as well as benefits of reduced stress and the experience of greater success in all aspects of daily life.   A: Pt participated in time management occupational therapy [facility]. Pt actively engaged in discussion on 10 tips for time management. Pt completed worksheets looking at her daily schedule, time wasters, and mine for time. Pt completed problem solving on how to incorporate time for herself as well as planning and working towards small goals versus large tasks that are harder to sustain. At end of session pt identified strategies for improving current time management including using a planner book, sticky notes, or google planner app on her phone.   P: Pt provided with time management strategies to implement during her daily schedule to assist in improving her balance between self-care, school, leisure, and PHP program tasks. OT will follow up with pt on success or struggles with implementation of learned strategies in 1 week.                    OT Short Term Goals - 05/26/17 1256      OT SHORT TERM GOAL #1   Title Patient will be educated on strategies to improve psychosocial skills needed to participate fully in all daily, work, and leisure activities.   Time 3   Period Weeks   Status New   Target Date 06/16/17     OT SHORT TERM GOAL #2   Title Patient will be educated on a HEP and independent with implementation of HEP.   Time 3   Period Weeks   Status New     OT SHORT TERM GOAL #3   Title Patient will independently apply psychosocial skills and coping mechanisms to her daily activities in order to function independently.   Time 3   Period Weeks   Status New                  Plan - 05/26/17 1255    Occupational performance deficits (Please refer to evaluation for details): ADL's;IADL's;Rest and  Sleep;Education;Work;Leisure;Social Participation   Rehab Potential Good   OT Frequency 2x / week   OT Duration --  3 weeks   OT Treatment/Interventions Self-care/ADL training;Other (comment)  coping skills training, psychosocial skills training, community reintegration   Clinical Decision Making Limited treatment options, no task modification necessary   Consulted and Agree with Plan of Care Patient      Patient will benefit from skilled therapeutic intervention in order to improve the following deficits and impairments:  Other (comment) (decreased coping skills; decreased psychosocial skills)  Visit Diagnosis: Severe recurrent major depression without psychotic features [facility](HCC)    Problem List Patient Active Problem List   Diagnosis Date Noted  . Insomnia 10/07/2016  . Left ankle sprain 09/04/2016  . Suicidal ideation 08/20/2016  . MDD (major depressive disorder),  recurrent severe, without psychosis (HCC) 08/20/2016  . Lower extremity pain 07/24/2016  . Idiopathic scoliosis 07/24/2016  . ADD (attention deficit disorder) without hyperactivity 05/15/2016  . Anxiety 08/29/2011  . Severe recurrent major depression without psychotic features [facility]) 08/29/2011   Ezra Sites, OTR/L  [phone] 05/26/2017, 1:06 PM  [facility] PARTIAL HOSPITALIZATION PROGRAM [address] [place], [place], [address] Phone: [phone]   Fax:  [phone]  Name: KAMANI MAGNUSSEN MRN: [id number] Date of Birth: [birth year]

## 2017-05-27 ENCOUNTER — Other Ambulatory Visit (HOSPITAL_COMMUNITY): Payer: 59 | Admitting: Licensed Clinical Social Worker

## 2017-05-27 DIAGNOSIS — F332 Major depressive disorder, recurrent severe without psychotic features: Secondary | ICD-10-CM

## 2017-05-27 NOTE — Psych (Signed)
   [facility]  Rebekah EstelleKathryn M Fluke [id number]  Session Time: 9- 2  Participation Level: Minimal  Behavioral Response: CasualAlertAnxious and Depressed  Type of Therapy: Group Therapy, Psychotherapy, Psychoeducation, Occupational Therapy  Treatment Goals addressed: Coping  Interventions: CBT, DBT, Supportive and Reframing  Summary:  9:00 - 10:30 Clinician led check-in regarding current stressors and situation, and review of patient completed daily inventory. Clinician utilized active listening and empathetic response and validated patient emotions. Clinician facilitated processing group on pertinent issues.  10:30 -11:30: OT Group  11:30-12:15: Clinician led psychoeducation group on distress tolerance. Self Soothe skill was introduced and patients discussed how to utilize it. Patients identified when this technique may be helpful in their personal lives.  12:15 - 1:00: Reflection group: Patients encouraged to practice skills and interpersonal techniques or work on mindfulness and relaxation techniques. The importance of self-care and making skills part of a routine to increase usage were stressed.  1:00 - 1:50 Clinician continued topic of "Distress Tolerance". Group discussed "ACCEPTS" and how/when patients can employ this method to help. Patients identified when this technique may be helpful in their personal lives.  1:50 - 2:00 Clinician led check-out. Clinician assessed for immediate needs, medication compliance and efficacy, and safety concerns.   Suicidal/Homicidal: Nowithout intent/plan  Therapist Response: Rebekah EstelleKathryn M [place] is a 21 y.o. female who presents with depression symptoms. Patient arrived within time allowed and reports she is feeling "OK." Patient rates her mood at a 6 on a 1- 10 scale with 10 being great. Pt reports she had a good weekend.  Pt states she moved stuff from her apartment at school to her parents' house.  Pt reported this was saddening  because it was more final that she was not going back to school this semester.  Pt reports she is still unsure of what to tell people about why she is home. Patient engaged in activity and discussion. Patient reports she will try using more self-soothe activities. Patient demonstrates some progress as evidenced by being able to relate to coping skills used in past. Patient denies SI/HI/self-harm thoughts at end of group.    Plan: Patient will continue in PHP and medication management. Work towards decreasing depression symptoms and increase emotional regulation and positive coping skills.    Diagnosis: Severe recurrent major depression without psychotic features (HCC) [F33.2]    1. Severe recurrent major depression without psychotic features (HCC)   2. GAD (generalized anxiety disorder)     Quinn AxeWhitney J Makenah Karas, LPCA 05/27/2017

## 2017-05-28 ENCOUNTER — Other Ambulatory Visit (HOSPITAL_COMMUNITY): Payer: 59 | Admitting: Specialist

## 2017-05-28 ENCOUNTER — Other Ambulatory Visit (HOSPITAL_COMMUNITY): Payer: 59 | Admitting: Licensed Clinical Social Worker

## 2017-05-28 VITALS — BP 112/78 | HR 72 | Ht 60.0 in | Wt 119.0 lb

## 2017-05-28 DIAGNOSIS — F332 Major depressive disorder, recurrent severe without psychotic features: Secondary | ICD-10-CM

## 2017-05-28 DIAGNOSIS — F411 Generalized anxiety disorder: Secondary | ICD-10-CM

## 2017-05-28 NOTE — Psych (Signed)
   [facility]  Rebekah Tate [id number]  Session Time: 9- 2  Participation Level: Active  Behavioral Response: CasualAlertDepressed  Type of Therapy: Group Therapy, Psychotherapy, Spiritual care, Activity therapy  Treatment Goals addressed: Coping  Interventions: CBT, DBT, Supportive and Reframing  Summary:  9:00 - 10:30 Clinician led check-in regarding current stressors and situation, and review of patient completed daily inventory. Clinician utilized active listening and empathetic response and validated patient emotions. Clinician facilitated processing group on pertinent issues.  10:30 -12:00 Spiritual care group  12:00 - 12:45 Reflection group: Patients encouraged to practice skills and interpersonal techniques or work on mindfulness and relaxation techniques. The importance of self-care and making skills part of a routine to increase usage were stressed.  12:45 - 1:50 Relaxation group: Cln Forde RadonLeanne Yates led yoga group focused on retraining the body's response to stress.  1:50 - 2:00 Clinician led check-out. Clinician assessed for immediate needs, medication compliance and efficacy, and safety concerns.    Suicidal/Homicidal: Nowithout intent/plan  Therapist Response: Rebekah Tate is a 21 y.o. female who presents with depression symptoms. Patient arrived within time allowed and reports she is feeling "pretty good." Patient rates her mood at a 7 on a 1- 10 scale with 10 being great. Pt reports she had a good afternoon and was able to exercise and get her laundry done. Pt states she spent time with her mom and that was positive. Patient engaged in activity and discussion. Patient demonstrates some progress as evidenced by increased participation in group. Patient denies SI/HI/self-harm thoughts at end of group.    Plan: Patient will continue in PHP and medication management. Work towards decreasing depression symptoms and increase emotional regulation and  positive coping skills.    Diagnosis: Severe recurrent major depression without psychotic features (HCC) [F33.2]    1. Severe recurrent major depression without psychotic features (HCC)     Donia GuilesJenny Aislyn Hayse, LCSW 05/28/2017

## 2017-05-28 NOTE — Therapy (Signed)
[facility] PARTIAL HOSPITALIZATION PROGRAM [address] [place], [place], [address] Phone: [phone]   Fax:  [phone]  Occupational Therapy Treatment  Patient Details  Name: Rebekah Tate MRN: [id number] Date of Birth: [birth year] Referring Provider: Dr. Carolanne Grumbling  Encounter Date: 05/28/2017      OT End of Session - 05/28/17 2133    Visit Number 2   Number of Visits 6   Date for OT Re-Evaluation 06/16/17   OT Start Time 1030   OT Stop Time 1130   OT Time Calculation (min) 60 min      Past Medical History:  Diagnosis Date  . Allergy   . Anxiety   . Asthma    As a child uses as needed albuterol when flares exercise and respiratory infections.  . Depression    meds since 9th grade  dr Lucianne Muss  . Scoliosis   . Seasonal allergies     Past Surgical History:  Procedure Laterality Date  . NO PAST SURGERIES      There were no vitals filed for this visit.      Subjective Assessment - 05/28/17 2132    Currently in Pain? No/denies           S:  I stress about being out and seeing someone and having to explain what happened to me.  O:  Patient participated in skilled OT life skills group focusing on stress management.  Patient was educated on and discussed causes of stress, healthy vs unhealthy stress, behaviors, symptoms, feelings associated with stress.  Patient was educated on relaxation techniques and emotional regulation techniques to cope with stress, as well as calming activities associated with each of their 5 senses.   A:  Patient was engaged throughout group.  Identififed that she will begin exercising again to cope with stress.   P:  continue participation in skilled OT group 2 times per week.  Next group will focus on healthy sleep hygiene.                     OT Education - 05/28/17 2133    Education provided Yes   Education Details educated on stress coping skills:  relaxation, emotional regulation, and  use of 5 senses.    Person(s) Educated Patient   Methods Explanation;Handout   Comprehension Verbalized understanding          OT Short Term Goals - 05/28/17 2134      OT SHORT TERM GOAL #1   Title Patient will be educated on strategies to improve psychosocial skills needed to participate fully in all daily, work, and leisure activities.   Time 3   Period Weeks   Status On-going     OT SHORT TERM GOAL #2   Title Patient will be educated on a HEP and independent with implementation of HEP.   Time 3   Period Weeks   Status On-going     OT SHORT TERM GOAL #3   Title Patient will independently apply psychosocial skills and coping mechanisms to her daily activities in order to function independently.   Time 3   Period Weeks   Status On-going                Patient will benefit from skilled therapeutic intervention in order to improve the following deficits and impairments:   (decreased coping skills, decreased psychosocialskills )  Visit Diagnosis: Severe recurrent major depression without psychotic features (HCC)    Problem  List Patient Active Problem List   Diagnosis Date Noted  . Insomnia 10/07/2016  . Left ankle sprain 09/04/2016  . Suicidal ideation 08/20/2016  . MDD (major depressive disorder), recurrent severe, without psychosis (HCC) 08/20/2016  . Lower extremity pain 07/24/2016  . Idiopathic scoliosis 07/24/2016  . ADD (attention deficit disorder) without hyperactivity 05/15/2016  . Anxiety 08/29/2011  . Severe recurrent major depression without psychotic features [facility](HCC) 08/29/2011    Shirlean MylarBethany H. Murray, MHA, OTR/L [phone]  05/28/2017, 9:35 PM  [facility] PART[facility] H[facility] PROGRAM [address] [place], [place], [address] Phone: [phone]   Fax:  [phone]  Name: Lucia EstelleKathryn M Maciolek MRN: [id number] Date of Birth: April 14, 1996

## 2017-05-28 NOTE — Progress Notes (Signed)
Met with patient today as she presented with flat affect, depressed mood and admitted she did not feel she was getting a lot out of PHP since she had attended in the past but was planning on continuing and to use skills learned. Patient discussed concern she does not want to return to previous job but knows she will need to do something since she is not returning to college this semester and she will discuss further with parents.  Patient reported still tired a lot and not feeling rested often after she sleeps her normal 5 hours nightly.  Patient reported appetite still up and denied any current suicidal or homicidal ideations.  Patient could not stay to meet with Dr. Daron Offer today so will follow up tomorrow.  Patient reports plan to continue PHP currently and does interact some in group.  Patient to let this nurse know if any problems with medications or worsening of symptoms and agreed with plan.

## 2017-05-29 ENCOUNTER — Other Ambulatory Visit (HOSPITAL_COMMUNITY): Payer: 59 | Admitting: Licensed Clinical Social Worker

## 2017-05-29 DIAGNOSIS — F332 Major depressive disorder, recurrent severe without psychotic features: Secondary | ICD-10-CM | POA: Diagnosis not present

## 2017-05-29 MED ORDER — VENLAFAXINE HCL ER 37.5 MG PO CP24: 37.5000 mg | ORAL_CAPSULE | Freq: Every day | ORAL | 0 refills | Status: DC

## 2017-05-29 NOTE — Progress Notes (Signed)
Psychiatric Initial Adult Assessment   Patient Identification: Rebekah Tate MRN:  [id number] Date of Evaluation:  05/29/17 Referral Source: PHP Chief Complaint:  Depression, SI Visit Diagnosis:  1. Severe episode of recurrent major depressive disorder, without psychotic features (HCC)     History of Present Illness:  Rebekah Tate is a 21 year old female with a history of major depressive disorder, ADHD, followed by Dr. Lucianne Muss at this office, who presents for [facility] after increased suicidal ideation in the context of the transition to college. Much of her recent stressors have been related to social anxiety, not feeling like she fits in college, and feeling overwhelmed with the course work.  Most recently hospitalized in November 2017 at [facility] behavioral health. Notably the patient is adopted since birth.  On discussion, Rebekah Tate presents as quite flat, depressed, disinterested. She reports that she feels like she stuck in terms of her life goals, and feels angry with herself that she is not in college. She reports that she feels sick and tired of being in this building and at the psychiatry clinic, and she feels like she needs to just move forward, be in college, and get things done. She denies any suicidality or self harm behaviors recently. She reports that she wants to be in healthcare herself and she wants to be a therapist for children. I spent time processing this with her and thinking about what we can do to help support her psyche in moving forward in getting out of this rut.    She reports that she feels like she has difficulty with energy, with focusing, with having motivation to do anything, and feels like [facility] could have been much better for her if her mood wasn't the way that it is now. She reports that she felt like she couldn't process the information and absorb the information in class, and felt like she was socially isolated. She reports that she does not drink alcohol or use  drugs, and a lot of kids her age are into partying, so she continues to find it difficult to find the right social environment for her.  She reports that she does have friends here in [place], and has spent some time with them since she came back from college. She did take a medical withdrawal and will return to college in the spring.    I spent time with her processing some of her frustration and grief about where she is in her life trajectory. I educated her on depression and I educated her on Effexor, and how this could be beneficial in addressing her depressive symptoms, and helping her with her cognition and thinking. Discussed that this can be quite activating, and we reviewed the risks and benefits of Effexor. We agreed to start at 37.5 mg and increase to 2 capsules in 5 days. She again denies any acute safety issues and agrees to proceed with a titration of Effexor.    Associated Signs/Symptoms: Depression Symptoms:  depressed mood, anhedonia, psychomotor retardation, fatigue, feelings of worthlessness/guilt, difficulty concentrating, hopelessness, recurrent thoughts of death, suicidal thoughts without plan, anxiety, (Hypo) Manic Symptoms:  Irritable Mood, Anxiety Symptoms:  Social Anxiety, Psychotic Symptoms:  none PTSD Symptoms: Negative  Past Psychiatric History: History of psychiatric hospitalization in 2017, and participation in the PHP at this office. She has been followed outpatient by Dr. Lucianne Muss, and seen multiple providers for individual therapy.  Previous Psychotropic Medications: Yes - Strattera, Focalin, Xanax, Prozac, Remeron, trazodone  Substance Abuse History in the  last 12 months:  No.  Consequences of Substance Abuse: Negative  Past Medical History:  Past Medical History:  Diagnosis Date  . Allergy   . Anxiety   . Asthma    As a child uses as needed albuterol when flares exercise and respiratory infections.  . Depression    meds since 9th grade  dr  Lucianne Muss  . Scoliosis   . Seasonal allergies     Family Psychiatric History: adopted  Family History:  Family History  Problem Relation Age of Onset  . Adopted: Yes  . Family history unknown: Yes    Social History:   Social History   Social History  . Marital status: Single    Spouse name: N/A  . Number of children: N/A  . Years of education: N/A   Occupational History  . Student    Social History Main Topics  . Smoking status: Never Smoker  . Smokeless tobacco: Never Used  . Alcohol use No  . Drug use: No  . Sexual activity: No   Other Topics Concern  . Not on file   Social History Narrative   Adopted from [place] when she was [age] year old. Intact family.   Attended [employer] day school. Went to [employer] for 2 years and didn't like it although good grades early of education.   Applying to other colleges considering state and [employer]   Negative TAD does regular exercise reports 2-3 hours of sleep; lives with parents and a dog at this time feels like she has a healthy situation   GOP 0 last Pap 06/20/2016    Additional Social History: lives with adoptive parents, recently withdrew from college  Allergies:   Allergies  Allergen Reactions  . Cat Hair Extract Other (See Comments)    Sneezing, rash, hives    Metabolic Disorder Labs: No results found for: HGBA1C   Chemistry      Component Value Date/Time   NA 139 08/21/2016 0653   K 4.0 08/21/2016 0653   CL 105 08/21/2016 0653   CO2 24 08/21/2016 0653   BUN 16 08/21/2016 0653   CREATININE 0.87 08/21/2016 0653      Component Value Date/Time   CALCIUM 9.7 08/21/2016 0653   ALKPHOS 35 (L) 08/21/2016 0653   ALKPHOS 34 (L) 08/21/2016 0653   AST 20 08/21/2016 0653   AST 19 08/21/2016 0653   ALT 14 08/21/2016 0653   ALT 15 08/21/2016 0653   BILITOT 1.3 (H) 08/21/2016 0653   BILITOT 1.5 (H) 08/21/2016 0653      Lab Results  Component Value Date   NA 139 08/21/2016   BUN 16 08/21/2016   CREATININE 0.87  08/21/2016   TSH 2.194 08/21/2016   WBC 5.2 08/21/2016   Lab Results  Component Value Date   WBC 5.2 08/21/2016   HGB 13.3 08/21/2016   HCT 40.9 08/21/2016   MCV 86.5 08/21/2016   PLT 285 08/21/2016   Lab Results  Component Value Date   CHOL 157 08/21/2016   Lab Results  Component Value Date   HDL 75 08/21/2016   Lab Results  Component Value Date   LDLCALC 66 08/21/2016   Lab Results  Component Value Date   TRIG 81 08/21/2016   Lab Results  Component Value Date   CHOLHDL 2.1 08/21/2016   No results found for: LDLDIRECT  Current Medications:  Current Outpatient Prescriptions:  .  albuterol (PROVENTIL HFA) 108 (90 BASE) MCG/ACT inhaler, Inhale 2 puffs into the lungs  daily as needed for wheezing or shortness of breath., Disp: , Rfl:  .  Norethindrone Acetate-Ethinyl Estrad-FE (LOESTRIN 24 FE) 1-20 MG-MCG(24) tablet, Take 1 tablet by mouth daily., Disp: , Rfl:  .  venlafaxine XR (EFFEXOR XR) 37.5 MG 24 hr capsule, Take 1 capsule (37.5 mg total) by mouth daily. Increase to 75 mg (2 capsules) in 5 days, Disp: 90 capsule, Rfl: 0  Neurologic: Headache: Negative Seizure: Negative Paresthesias:Negative  Musculoskeletal: Strength & Muscle Tone: within normal limits Gait & Station: normal Patient leans: N/A  Psychiatric Specialty Exam: ROS  There were no vitals filed for this visit.   General Appearance: Casual and Guarded  Eye Contact:  Fair  Speech:  Normal Rate  Volume:  Decreased  Mood:  Depressed and Dysphoric  Affect:  Constricted, Depressed and Flat  Thought Process:  Goal Directed  Orientation:  Full (Time, Place, and Person)  Thought Content:  Logical  Suicidal Thoughts:  No  Homicidal Thoughts:  No  Memory:  Immediate;   Fair  Judgement:  Fair  Insight:  Fair  Psychomotor Activity:  Psychomotor Retardation  Concentration:  Attention Span: Fair  Recall:  Good  Fund of Knowledge:Fair  Language: Good  Akathisia:  Negative  Handed:  Right  AIMS  (if indicated):  0  Assets:  Communication Skills Desire for Improvement Financial Resources/Insurance Housing Leisure Time Physical Health Resilience Social Support Talents/Skills Transportation Vocational/Educational  ADL's:  Intact  Cognition: WNL  Sleep:  8-10 hours    Treatment Plan Summary: Rebekah Tate is a 21 year old female [facility]. She presents with severe depressive symptoms, impairing her ability to participate in schooling, and has suffered with recent suicidal thoughts in the past few weeks. She has a history of psychiatric hospitalization, and his high risk for self-harm. I believe she is an appropriate candidate to participate in the PHP at this clinic. She is not currently on any antidepressant therapies, and was most recently taken off of Prozac in anticipation of a new medication being started. I believe she is an appropriate candidate for intervention with SNRI, and we have agreed to start Effexor 37.5 mg and titrate as below.  She denies any acute intentions to harm herself, and remain safe to participate in the PHP.  She has excellent support from her parents, with whom she lives, and patient/family understands emergency resources if needed.  She does not participate in any drug or alcohol abuse. We'll follow-up in the coming weeks as part of PHP, and she will transfer care to this writer on an ongoing basis when she is done with the PHP.  1. Severe episode of recurrent major depressive disorder, without psychotic features (HCC)    Continue participation with PHP at this time Initiate Effexor 37.5 mg XR daily, increase to 75 mg in 5-7 days as tolerated My goal dose for this patient is 150-300 mg of Effexor If she has any disruption in her sleep, we can consider augmentation with Remeron   Angus PalmsAlexander Wadie Mattie, MD

## 2017-05-29 NOTE — Psych (Signed)
   [facility]  Rebekah Tate [id number]  Session Time: 9- 1  Participation Level: Minimal  Behavioral Response: CasualAlertDepressed  Type of Therapy: Group Therapy, Psychotherapy, Psychoeducation  Treatment Goals addressed: Coping  Interventions: CBT, DBT, Supportive and Reframing  Summary:  9:00 - 10:00 Clinician led check-in regarding current stressors and situation, and review of patient completed daily inventory. Clinician utilized active listening and empathetic response and validated patient emotions. Clinician facilitated processing group on pertinent issues.  10:00 -11:00: Clinician continued discussion on cognitive distortions from yesterday. Cln educated ways to reframe unhealthy thought patterns utilizing skills of check the facts, and the best friend test.  11:00 - 12:00: Clinician led psychoeducation group on emotion, logic, and wise mind and dialectical thinking. Clinician utilized pt examples to make relevant to pt's lives.  12:00 - 12:50 Clinician introduced topic of fear related to goals. Group watched "Why You Should Define Your Fears Instead of Your Goals" Ted-Talk. Patients discussed fear setting and how to utilize it as a skill.  12:50 - 1:00 Clinician led check-out. Clinician assessed for immediate needs, medication compliance and efficacy, and safety concerns.   Suicidal/Homicidal: Nowithout intent/plan  Therapist Response: Rebekah Tate is a 21 y.o. female who presents with depression symptoms. Patient arrived within time allowed and reports she is feeling at "baseline, not extremely happy or sad". Patient rates her mood at a 6 on a scale of 1-10 with 10 being great. Patient reports she was able to complete what she had planned yesterday and slept well. Patient stated she was able to take a nap and get her car from the maintenance shop. Patient engaged in activity and discussion. Patient demonstrates limited progress as evidenced by  reporting ways she has coped int he past yet maintaining minimal engagement. Patient denies SI/HI/ self-harm thoughts at end of group.   Plan: Patient will continue in PHP and medication management. Work towards decreasing depression symptoms and increase emotional regulation and positive coping skills.    Diagnosis: Severe episode of recurrent major depressive disorder, without psychotic features (HCC) [F33.2]    1. Severe episode of recurrent major depressive disorder, without psychotic features (HCC)     Donia GuilesJenny Vidhi Delellis, LCSW 05/29/2017

## 2017-05-29 NOTE — Psych (Signed)
   [facility]  Rebekah Tate [id number]  Session Time: 9- 2  Participation Level: Active  Behavioral Response: CasualAlertDepressed  Type of Therapy: Group Therapy, Psychotherapy, Psychoeducation, Occupational Therapy  Treatment Goals addressed: Coping  Interventions: CBT, DBT, Supportive and Reframing  Summary:  9:00 - 10:30 Clinician led check-in regarding current stressors and situation, and review of patient completed daily inventory. Clinician utilized active listening and empathetic response and validated patient emotions. Clinician facilitated processing group on pertinent issues.  10:30 -11:30: OT Group  11:30-12:00: Clinician led group discussion on fear.  12:00 - 12:45 Reflection group: Patients encouraged to practice skills and interpersonal techniques or work on mindfulness and relaxation techniques. The importance of self-care and making skills part of a routine to increase usage were stressed.  12:45 - 1:50 Clinician introduced topic of "Cognitive Distortions". Patients identified cognitive distortions they often have and ways to combat these distortions.  1:50 - 2:00 Clinician led check-out. Clinician assessed for immediate needs, medication compliance and efficacy, and safety concerns.   Suicidal/Homicidal: Nowithout intent/plan  Therapist Response: Rebekah Tate is a 21 y.o. female who presents with depression symptoms. Patient arrived within time allowed and reports she is feeling "fair". Patient rates her mood at a 7 on a 1-10 scale with 10 being great. Patient reports that she was able to complete what she had planned yesterday and slept pretty well. Patient stated she kept busy by gong to get her car maintenance done and started to knit. Patient engaged in activity and discussion. Patient demonstrates some progress as evidenced by stating ways she can utilize skills for her depression and anxiety. Patient denies SI/HI/ self-harm thoughts at  the end of group.   Plan: Patient will continue in PHP and medication management. Work towards decreasing depression symptoms and increase emotional regulation and positive coping skills.    Diagnosis: Severe recurrent major depression without psychotic features (HCC) [F33.2]    1. Severe recurrent major depression without psychotic features (HCC)   2. GAD (generalized anxiety disorder)     Quinn AxeWhitney J Shawndale Kilpatrick, LPCA 05/29/2017

## 2017-06-01 ENCOUNTER — Other Ambulatory Visit (HOSPITAL_COMMUNITY): Payer: 59 | Admitting: Licensed Clinical Social Worker

## 2017-06-01 DIAGNOSIS — F332 Major depressive disorder, recurrent severe without psychotic features: Secondary | ICD-10-CM

## 2017-06-01 DIAGNOSIS — F411 Generalized anxiety disorder: Secondary | ICD-10-CM

## 2017-06-02 ENCOUNTER — Other Ambulatory Visit (HOSPITAL_COMMUNITY): Payer: 59 | Admitting: Occupational Therapy

## 2017-06-02 ENCOUNTER — Other Ambulatory Visit (HOSPITAL_COMMUNITY): Payer: 59 | Admitting: Licensed Clinical Social Worker

## 2017-06-02 DIAGNOSIS — F332 Major depressive disorder, recurrent severe without psychotic features: Secondary | ICD-10-CM | POA: Diagnosis not present

## 2017-06-02 DIAGNOSIS — F411 Generalized anxiety disorder: Secondary | ICD-10-CM

## 2017-06-02 NOTE — Progress Notes (Signed)
Met with Rebekah Tate regarding recent medication initiation and progress in PHP.  She has been minimally participatory in PHP, often sitting with her arms crossed, not willing to participate in discussion. She reflects with Probation officer that she feels like she does not have anything in common with the people in PHP. I reflected back to her that we can all struggle with periods of depression, and perhaps this is the common ground. She feels uncomfortable in PHP, and feels that it is unproductive. Patient and I had a conversation about her feeling of lack of autonomy and respect for her sense of choice. I asked her more about this, and she does not feel the PHP as the right environment for her.  She reports that she is not having any side effects from Effexor, and agrees to increase to 2 capsules at the end of this week. She denies any purposeful self-injurious behaviors. She does continue to have scratches at the tips of her fingers related to skin picking in the evening. Encouraged her to take either trazodone or Benadryl to help herself down at night and to be able to get more restful sleep, and she was receptive to this.   She reports that she does not have any intention to harm herself or end her life.  She reports that she feels stuck and she wants to move forward, get back to college in the spring, and be successful in terms of her future goals. 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  She also plans to reconnect with some of her friends and social circle so she has consistent support. She has no intention of dating, and tends to work on herself.   I asked the patient if she feels open to the idea of individual therapy, and she reports that she is open to doing this, and agreeable to meeting with a therapist here in this office on a regular basis.  We agreed to proceed with  discharge from [facility], and to proceed with individual therapy and medication management follow-up with this Probation officer.  Discussed the plan of care with PHP team, and they are in agreement.  - Cleared for discharge from [facility] - Start weekly individual therapy in this office with Evelena Peat - Follow-up with this writer every 2-3 weeks for medication management - Effexor to be increased to 150 mg XR at the end of this week - The patient has sleep difficulties, encouraged the use of trazodone or Benadryl prn

## 2017-06-02 NOTE — Therapy (Addendum)
[address] [place], [place], [address] Phone: [phone]   Fax:  [phone]  Occupational Therapy Treatment  Patient Details  Name: Rebekah Tate MRN: [id number] Date of Birth: [birth year] Referring Provider: Dr. Donnelly Angelica  Encounter Date: 06/02/2017      OT End of Session - 06/02/17 1207    Visit Number 3   Number of Visits 6   Date for OT Re-Evaluation 06/16/17   OT Start Time 1030   OT Stop Time 1110   OT Time Calculation (min) 40 min      Past Medical History:  Diagnosis Date  . Allergy   . Anxiety   . Asthma    As a child uses as needed albuterol when flares exercise and respiratory infections.  . Depression    meds since 9th grade  dr Dwyane Dee  . Scoliosis   . Seasonal allergies     Past Surgical History:  Procedure Laterality Date  . NO PAST SURGERIES      There were no vitals filed for this visit.      Subjective Assessment - 06/02/17 1207    Currently in Pain? No/denies            [facility] OT Assessment - 06/02/17 1207      Assessment   Diagnosis Major Depressive Disorder     Precautions   Precautions None         OT Treatment Group: Sleep Hygiene   S:  "I have some trouble sleeping."  O:  Patient actively participated in the following skilled occupational therapy treatment session this date: Sleep hygiene - Began session with sleep hygiene questionnaire. Pt completed questionnaire and participated in discussion. Group then played Sleep Hygiene Taboo game, pt actively participated and engaged in game. Discussed importance of a routine leading up to bedtime, and educated on factors influencing sleep. Patient marginally engaged during session. Pt participated in group discussion of factors that interfere with sleep and lifestyle factors promoting sleep. Discussed importance of exercise for the body's metabolism and promoting healthy sleep, as well as foods that can  help or hinder sleep.  A:  Patient participated in skilled occupational therapy group for sleep hygiene skills this date.  Patient was marginally engaged during session and appears open to strategies introduced.  Provided pt with sleep prep routine outline to complete.  P:  Continue participation in skilled occupational therapy groups  1-2 times per week in order to gain the necessary skills needed to return to full time community living and learn effective coping strategies to be a productive community resident. Next session: physical and mental health and wellness.             OT Short Term Goals - 05/28/17 2134      OT SHORT TERM GOAL #1   Title Patient will be educated on strategies to improve psychosocial skills needed to participate fully in all daily, work, and leisure activities.   Time 3   Period Weeks   Status On-going     OT SHORT TERM GOAL #2   Title Patient will be educated on a HEP and independent with implementation of HEP.   Time 3   Period Weeks   Status On-going     OT SHORT TERM GOAL #3   Title Patient will independently apply psychosocial skills and coping mechanisms to her daily activities in order to function independently.   Time 3   Period Weeks  Status On-going                  Plan - 06/02/17 1207    Rehab Potential Good   OT Frequency 2x / week   OT Duration --  3 weeks   OT Treatment/Interventions Self-care/ADL training;Other (comment)  coping skills training, psychosocial skills training, community reintegration   Consulted and Agree with Plan of Care Patient      Patient will benefit from skilled therapeutic intervention in order to improve the following deficits and impairments:  Other (comment) (decreased coping skills; decreased psychosocial skills)  Visit Diagnosis: Severe episode of recurrent major depressive disorder, without psychotic features [facility])    Problem List Patient Active Problem List   Diagnosis Date Noted   . Insomnia 10/07/2016  . Left ankle sprain 09/04/2016  . Suicidal ideation 08/20/2016  . MDD (major depressive disorder), recurrent severe, without psychosis ([place]) 08/20/2016  . Lower extremity pain 07/24/2016  . Idiopathic scoliosis 07/24/2016  . ADD (attention deficit disorder) without hyperactivity 05/15/2016   [name], OTR/L  [phone] 06/02/2017, 12:08 PM  [address] [place], [place], [address] Phone: [phone]   Fax:  [phone]  Name: Rebekah Tate MRN: [id number] Date of Birth: September 08, 1996    OCCUPATIONAL THERAPY DISCHARGE SUMMARY  Visits from Start of Care: 3  Current functional level related to goals / functional outcomes: Rebekah Tate was minimally engaged in therapy session, no evidence of utilizing strategies discussed.  Pt is discharging from the [facility] program.    Remaining deficits: Continues to have difficulty with coping skills.    Education / Equipment: Educated on sleep hygiene, stress management, and time management strategies.  Plan: Patient agrees to discharge.  Patient goals were not met. Patient is being discharged due to the patient's request.  ?????

## 2017-06-03 ENCOUNTER — Other Ambulatory Visit (HOSPITAL_COMMUNITY): Payer: Self-pay

## 2017-06-04 ENCOUNTER — Other Ambulatory Visit (HOSPITAL_COMMUNITY): Payer: Self-pay

## 2017-06-04 ENCOUNTER — Ambulatory Visit (HOSPITAL_COMMUNITY): Payer: Self-pay

## 2017-06-04 NOTE — Psych (Signed)
   [facility] THERAPIST [facility]  Lucia EstelleKathryn M Mata [id number]  Session Time: 9- 2  Participation Level: Minimal  Behavioral Response: CasualAlertDepressed  Type of Therapy: Group Therapy, Psychotherapy, Psychoeducation, OT  Treatment Goals addressed: Coping  Interventions: CBT, DBT, Supportive and Reframing  Summary:  9:00 - 10:30 Clinician led check-in regarding current stressors and situation, and review of patient completed daily inventory. Clinician utilized active listening and empathetic response and validated patient emotions. Clinician facilitated processing group on pertinent issues.  10:30 -11:30: OT Group  11:30 - 12:00 Clinician continued topic of communication. Clinician introduced "I Statements" and patients practiced responding to situations using I Statements.  12:00 - 12:45 Reflection group: Patients encouraged to practice skills and interpersonal techniques or work on mindfulness and relaxation techniques. The importance of self-care and making skills part of a routine to increase usage were stressed.  12:45 - 1:50 Cln provided psychoeducation on Active Listening and how it can aid in communication. Group reviewed "fair fighting rules" and how to make communication effective during conflicts.  1:50 - 2:00 Clinician led check-out. Clinician assessed for immediate needs, medication compliance and efficacy, and safety concerns.    Suicidal/Homicidal: Nowithout intent/plan  Therapist Response: Lucia EstelleKathryn M Ernster is a 21 y.o. female who presents with depression symptoms. Patient arrived within time allowed and reports she is feeling at "better."  Patient rates her mood at a 7.5 on a scale of 1-10 with 10 being great. Patient shares she continues to feel low energy however managed to be productive yesterday afternoon by cleaning her room and reading a book. Patient minimally engaged in activity and discussion. Patient continues to demonstrate limited progress in group as  evidenced by low engagement. Patient denies SI/HI/ self-harm thoughts at end of group.   Plan[facility]. Pt has limited progress in program due to lack of engagement however reports feeling stable and denies current SI/HI. Psychiatrist has approved pt's discharge. Pt will step down to outpatient therapy and psychiatry. Pt has followup appointments with this agency for therapy on 06/08/17 with Theodosia BlenderJesse Schlossenberg and for psychiatry on 07/18/17 with Dr. Rene KocherEksir.    Diagnosis: Severe episode of recurrent major depressive disorder, without psychotic features (HCC) [F33.2]    1. Severe episode of recurrent major depressive disorder, without psychotic features (HCC)   2. GAD (generalized anxiety disorder)     Donia GuilesJenny Keith Cancio, LCSW 06/04/2017

## 2017-06-04 NOTE — Psych (Signed)
   [facility]  Rebekah Tate [id number]  Session Time: 9- 2  Participation Level: Minimal  Behavioral Response: CasualAlertDepressed  Type of Therapy: Group Therapy, Psychotherapy, Psychoeducation, Medication Group  Treatment Goals addressed: Coping  Interventions: CBT, DBT, Supportive and Reframing  Summary:  9:00 - 10:30: Pharmacist discussed medication with group and answered questions.  10:30 -12:00: Clinician led check-in regarding current stressors and situation, and review of patient completed daily inventory. Clinician utilized active listening and empathetic response and validated patient emotions. Clinician facilitated processing group on pertinent issues.  12:00 - 12:45 Reflection group: Patients encouraged to practice skills and interpersonal techniques or work on mindfulness and relaxation techniques. The importance of self-care and making skills part of a routine to increase usage were stressed.  12:45 - 1:50 Clinician introduced topic of communication. Clinician introduced 4 communication styles: passive, aggressive, passive-aggressive, and assertive and group members discussed which style they have. Clinician educated on non-verbal aspect of communication.  1:50 - 2:00 Clinician led check-out. Clinician assessed for immediate needs, medication compliance and efficacy, and safety concerns.    Suicidal/Homicidal: Nowithout intent/plan  Therapist Response: Rebekah Tate is a 21 y.o. female who presents with depression symptoms. Patient arrived within time allowed and reports she is feeling at "annoyed."  Patient rates her mood at a 5.5 on a scale of 1-10 with 10 being great. Patient shares she is "tired of being in treatment." Pt reports feeling as if she had to come to please her mother and doctor and feels it was not her choice. Pt states she slept most of the weekend however did have dinner with friends on Friday and ran errands with her parents.   Patient minimally engaged in activity and discussion, speaking only when spoken to. Patient demonstrates continued limited progress as evidenced by lack of engagement in group. Patient denies SI/HI/ self-harm thoughts at end of group.   Plan: Patient has requested to discharge and will meet with psychiatrist tomorrow to discuss her request. Patient will continue in PHP and medication management. Work towards decreasing depression symptoms and increase emotional regulation and positive coping skills.    Diagnosis: Severe episode of recurrent major depressive disorder, without psychotic features (HCC) [F33.2]    1. Severe episode of recurrent major depressive disorder, without psychotic features (HCC)   2. GAD (generalized anxiety disorder)     Donia GuilesJenny Iman Orourke, LCSW 06/04/2017

## 2017-06-05 ENCOUNTER — Other Ambulatory Visit (HOSPITAL_COMMUNITY): Payer: Self-pay

## 2017-06-05 ENCOUNTER — Telehealth (HOSPITAL_COMMUNITY): Payer: Self-pay | Admitting: *Deleted

## 2017-06-05 NOTE — Telephone Encounter (Signed)
Prior authorization from office received for Dexmethylphenidate ER. Medication not on active med list therefore no authorization is required at this time.

## 2017-06-08 ENCOUNTER — Ambulatory Visit (HOSPITAL_COMMUNITY): Payer: Self-pay | Admitting: Licensed Clinical Social Worker

## 2017-06-08 ENCOUNTER — Other Ambulatory Visit (HOSPITAL_COMMUNITY): Payer: Self-pay

## 2017-06-09 ENCOUNTER — Other Ambulatory Visit (HOSPITAL_COMMUNITY): Payer: Self-pay

## 2017-06-09 ENCOUNTER — Ambulatory Visit (HOSPITAL_COMMUNITY): Payer: Self-pay

## 2017-06-10 ENCOUNTER — Other Ambulatory Visit (HOSPITAL_COMMUNITY): Payer: Self-pay

## 2017-06-11 ENCOUNTER — Other Ambulatory Visit (HOSPITAL_COMMUNITY): Payer: Self-pay

## 2017-06-11 ENCOUNTER — Ambulatory Visit (HOSPITAL_COMMUNITY): Payer: Self-pay

## 2017-06-12 ENCOUNTER — Other Ambulatory Visit (HOSPITAL_COMMUNITY): Payer: Self-pay

## 2017-06-15 ENCOUNTER — Other Ambulatory Visit (HOSPITAL_COMMUNITY): Payer: Self-pay

## 2017-06-16 ENCOUNTER — Other Ambulatory Visit (HOSPITAL_COMMUNITY): Payer: Self-pay

## 2017-06-16 ENCOUNTER — Ambulatory Visit (HOSPITAL_COMMUNITY): Payer: Self-pay

## 2017-06-17 ENCOUNTER — Other Ambulatory Visit (HOSPITAL_COMMUNITY): Payer: Self-pay

## 2017-06-18 ENCOUNTER — Ambulatory Visit (HOSPITAL_COMMUNITY): Payer: Self-pay

## 2017-06-18 ENCOUNTER — Other Ambulatory Visit (HOSPITAL_COMMUNITY): Payer: Self-pay

## 2017-06-19 ENCOUNTER — Other Ambulatory Visit (HOSPITAL_COMMUNITY): Payer: Self-pay

## 2017-06-24 ENCOUNTER — Ambulatory Visit (INDEPENDENT_AMBULATORY_CARE_PROVIDER_SITE_OTHER): Payer: 59 | Admitting: Licensed Clinical Social Worker

## 2017-06-24 ENCOUNTER — Encounter (HOSPITAL_COMMUNITY): Payer: Self-pay | Admitting: Licensed Clinical Social Worker

## 2017-06-24 DIAGNOSIS — F331 Major depressive disorder, recurrent, moderate: Secondary | ICD-10-CM

## 2017-06-24 NOTE — Progress Notes (Signed)
   THERAPIST PROGRESS NOTE  Session Time: 11:00am-11:45am  Participation Level: Active  Behavioral Response: DisheveledDrowsyDepressed  Type of Therapy: Individual Therapy   Treatment Goals addressed: Diagnosis: major depressive disorder, recurrent episode, moderate    Interventions: CBT and Motivational Interviewing   Summary: Rebekah Tate is a 21 y.o. female who presents with Major Depressive Disorder, recurrent episode, moderate.   Suicidal/Homicidal: No without intent/plan   Therapist Response: Rebekah Tate engaged well in session and discussed ongoing depressed mood and lack of motivation. Rebekah Tate reported she had been in therapy forever and she had periods of remission. However, Rebekah Tate reports current feelings of fatigue, lack of motivation, lack of interest in doing typical coping skills, and difficulty initiating or engaging in social interactions. Rebekah Tate identified strong natural supports. Clinician built rapport and attempted to engage in a mindfulness technique about gratitude. However, Rebekah Tate reported she was unable to focus on the task and started to fall asleep. Rebekah Tate reported she took 100 mg Trazodone last night, which made her still feel very sleepy today.   Plan: Return again in 1-2 weeks.   Diagnosis:      Axis I: 296.32 (F33.1) Major Depressive Disorder, Recurrent episode, Moderate    Veneda Melter, LCSW 06/24/2017

## 2017-07-14 ENCOUNTER — Ambulatory Visit (INDEPENDENT_AMBULATORY_CARE_PROVIDER_SITE_OTHER): Payer: 59 | Admitting: Psychiatry

## 2017-07-14 ENCOUNTER — Encounter (HOSPITAL_COMMUNITY): Payer: Self-pay | Admitting: Psychiatry

## 2017-07-14 VITALS — BP 110/70 | HR 69 | Ht 60.0 in | Wt 121.0 lb

## 2017-07-14 DIAGNOSIS — F341 Dysthymic disorder: Secondary | ICD-10-CM

## 2017-07-14 DIAGNOSIS — F332 Major depressive disorder, recurrent severe without psychotic features: Secondary | ICD-10-CM

## 2017-07-14 DIAGNOSIS — Z79899 Other long term (current) drug therapy: Secondary | ICD-10-CM

## 2017-07-14 MED ORDER — VENLAFAXINE HCL ER 150 MG PO CP24: 150.0000 mg | ORAL_CAPSULE | Freq: Every day | ORAL | 0 refills | Status: DC

## 2017-07-14 NOTE — Progress Notes (Signed)
BH MD/PA/NP OP Progress Note  07/14/2017 9:27 AM Rebekah EstelleKathryn M Yanik  MRN:  [id number]  Chief Complaint: Med management HPI: Patient reports that she has felt slightly improved in terms of her mood.  She reports that she is sleeping well, not requiring trazodone.  She is on Effexor 75 mg Xr daily.  Denies any headaches or GI complaints.  Denies any suicidal thoughts.  Reports that her parents have been giving her more space which has been positive.  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  She continues to feel fatigued, difficulty with motivation, and feels that she is not happy.  Spent time talking about behavioral strategies, and to the treatment relationship between providers, therapists, and the patient.  Inquired what she plans to do to be able to work on her mood symptoms.  She is agreeable to follow-up in therapy more frequently.  She is also agreeable to work on exercising more and engaging in activating activities.  We agreed to increase Effexor to 150 mg XR.  Reviewed the risks of serotonin syndrome, headaches, dry mouth, black box warning, and we agreed to follow-up in 8 weeks.  Visit Diagnosis:    ICD-10-CM   1. Persistent depressive disorder F34.1   2. Severe episode of recurrent major depressive disorder, without psychotic features (HCC) F33.2 venlafaxine XR (EFFEXOR-XR) 150 MG 24 hr capsule    Past Psychiatric History: See intake H&P for full details. Reviewed, with no updates at this time.   Past Medical History:  Past Medical History:  Diagnosis Date  . Allergy   . Anxiety   . Asthma    As a child uses as needed albuterol when flares exercise and respiratory infections.  . Depression    meds since 9th grade  dr Lucianne Musskumar  . Scoliosis   . Seasonal allergies     Past Surgical History:  Procedure Laterality Date  . NO PAST SURGERIES      Family Psychiatric History: See  intake H&P for full details. Reviewed, with no updates at this time.   Family History:  Family History  Problem Relation Age of Onset  . Adopted: Yes  . Family history unknown: Yes    Social History:  Social History   Social History  . Marital status: Single    Spouse name: N/A  . Number of children: N/A  . Years of education: N/A   Occupational History  . Student    Social History Main Topics  . Smoking status: Never Smoker  . Smokeless tobacco: Never Used  . Alcohol use No  . Drug use: No  . Sexual activity: No   Other Topics Concern  . None   Social History Narrative   Adopted from [place] when she was [age] year old. Intact family.   Attended [employer] day school. Went to [employer] for 2 years and didn't like it although good grades early of education.   Applying to other colleges considering state and [employer]   Negative TAD does regular exercise reports 2-3 hours of sleep; lives with parents and a dog at this time feels like she has a healthy situation   GOP 0 last Pap 06/20/2016    Allergies:  Allergies  Allergen Reactions  . Cat Hair Extract Other (See Comments)    Sneezing, rash, hives    Metabolic Disorder Labs: No results found for: HGBA1C, MPG Lab Results  Component  Value Date   PROLACTIN 27.2 (H) 08/21/2016   Lab Results  Component Value Date   CHOL 157 08/21/2016   TRIG 81 08/21/2016   HDL 75 08/21/2016   CHOLHDL 2.1 08/21/2016   VLDL 16 08/21/2016   LDLCALC 66 08/21/2016   Lab Results  Component Value Date   TSH 2.194 08/21/2016    Therapeutic Level Labs: No results found for: LITHIUM No results found for: VALPROATE No components found for:  CBMZ  Current Medications: Current Outpatient Prescriptions  Medication Sig Dispense Refill  . albuterol (PROVENTIL HFA) 108 (90 BASE) MCG/ACT inhaler Inhale 2 puffs into the lungs daily as needed for wheezing or shortness of breath.    . Norethindrone Acetate-Ethinyl Estrad-FE (LOESTRIN 24 FE)  1-20 MG-MCG(24) tablet Take 1 tablet by mouth daily.    . traZODone (DESYREL) 50 MG tablet Take 50 mg by mouth at bedtime.    Marland Kitchen venlafaxine XR (EFFEXOR-XR) 150 MG 24 hr capsule Take 1 capsule (150 mg total) by mouth daily with breakfast. 90 capsule 0   No current facility-administered medications for this visit.      Musculoskeletal: Strength & Muscle Tone: within normal limits Gait & Station: normal Patient leans: N/A  Psychiatric Specialty Exam: ROS  Blood pressure 110/70, pulse 69, height 5' (1.524 m), weight 121 lb (54.9 kg).Body mass index is 23.63 kg/m.  General Appearance: Casual and Fairly Groomed  Eye Contact:  Fair  Speech:  Clear and Coherent  Volume:  Normal  Mood:  Depressed and Dysphoric  Affect:  Congruent and Constricted  Thought Process:  Goal Directed and Descriptions of Associations: Intact  Orientation:  Full (Time, Place, and Person)  Thought Content: Logical   Suicidal Thoughts:  No  Homicidal Thoughts:  No  Memory:  Immediate;   Fair  Judgement:  Fair  Insight:  Fair  Psychomotor Activity:  Normal  Concentration:  Concentration: Fair  Recall:  Fair  Fund of Knowledge: Good  Language: Good  Akathisia:  Negative  Handed:  Right  AIMS (if indicated): not done  Assets:  Interior and spatial designer Vocational/Educational  ADL's:  Intact  Cognition: WNL  Sleep:  Good   Screenings: AIMS     Ad[facility] (Dis[facility]) [facility]  AIMS Total Score  0    AUDIT     Admission (Discharged) from [facility]  Alcohol Use Disorder Identification Test Final Score (AUDIT)  0    GAD-7     Counselor from 06/02/2017 in [facility] Counselor from 05/22/2017 in [facility]  Total GAD-7 Score  5  11    MDI     [facility] 09[facility]19/2011  [facility]  Total Score (max 50)  9    PHQ2-9     Counselor from 06/02/2017 in [facility] Counselor from 05/22/2017 in [facility] Office Visit from 12/09/2016 in [facility] Office Visit from 10/07/2016 in [facility] Office Visit from 02/25/2015 in [facility] at [facility] Total Score  3  6  0  0  0  PHQ-9 Total Score  14  21  -  -  -       Assessment and Plan:  Rebekah Tate presents today for med management for depression.  She continues to present with  severe symptoms of depression, with perhaps some mild improvement in the range of her affect, smiling periodically today.  We spent time discussing medication changes, and I educated her on the risks and benefits and common side effects.  We had a conversation about her role in therapy and mental health care, and she continues to think about what she can do to help lift her mood.  1. Persistent depressive disorder   2. Severe episode of recurrent major depressive disorder, without psychotic features (HCC)     Status of current problems: unchanged  Labs Ordered: No orders of the defined types were placed in this encounter.   Labs Reviewed: NA  Collateral Obtained/Records Reviewed: N/A  Plan:  Increase Effexor to 150 mg XR daily Return to clinic in 8 weeks Patient is not using trazodone Follow-up in therapy  I spent 20 minutes with the patient in direct face-to-face clinical care.  Greater than 50% of this time was spent in counseling and coordination of care with the patient.    Burnard Leigh, MD 07/14/2017, 9:27 AM

## 2017-07-16 ENCOUNTER — Ambulatory Visit (INDEPENDENT_AMBULATORY_CARE_PROVIDER_SITE_OTHER): Payer: 59 | Admitting: Licensed Clinical Social Worker

## 2017-07-16 ENCOUNTER — Encounter (HOSPITAL_COMMUNITY): Payer: Self-pay | Admitting: Licensed Clinical Social Worker

## 2017-07-16 DIAGNOSIS — F331 Major depressive disorder, recurrent, moderate: Secondary | ICD-10-CM

## 2017-07-16 NOTE — Progress Notes (Signed)
   THERAPIST PROGRESS NOTE  Session Time: 10:00am-11:00am  Participation Level: Active  Behavioral Response: CasualAlertDepressed  Type of Therapy: Individual Therapy   Treatment Goals addressed: Diagnosis: major depressive disorder, recurrent episode, moderate    Interventions: CBT and Motivational Interviewing   Summary: Rebekah EstelleKathryn M Cipollone is a 21 y.o. female who presents with Major Depressive Disorder, recurrent episode, moderate.   Suicidal/Homicidal: No without intent/plan   Therapist Response: Rebekah Tate engaged well in session and discussed updates in her life, including a new job. 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 Rebekah Tate identified need to increase social interactions, in order to prepare herself for returning to college. Clinician utilized MI to explore current interactions and encouraged "activity-based" social interactions in order to develop more emotional closeness. Clinician discussed several options for activities to pursue, such as sports, shopping, hiking, lunch dates, etc. [name] reports she has befriended some people from her PHP group that she spends time with, but feels somewhat socially awkward.  Clinician explored plans for school and discussed possible choices of course of study and [place]. [name] identified concern about not knowing exactly what she wants to study. Clinician encouraged Rebekah Tate to explore different options, but to use the time productively toward her degree [place] in [place].    Plan: Return again in 1-2 weeks.   Diagnosis:      Axis I: 296.32 (F33.1) Major Depressive Disorder, Recurrent episode, Moderate    Veneda MelterJessica R Schlosberg, LCSW 07/16/2017

## 2017-07-27 ENCOUNTER — Ambulatory Visit (HOSPITAL_COMMUNITY): Payer: Self-pay | Admitting: Licensed Clinical Social Worker

## 2017-09-25 ENCOUNTER — Telehealth: Payer: Self-pay | Admitting: Internal Medicine

## 2017-09-25 DIAGNOSIS — Z0000 Encounter for general adult medical examination without abnormal findings: Secondary | ICD-10-CM

## 2017-09-25 NOTE — Telephone Encounter (Signed)
Ok to do referral  dont know diagnosis howeverf

## 2017-09-25 NOTE — Telephone Encounter (Signed)
Copied from CRM [phone]. Topic: Referral - Request >> Sep 25, 2017  2:51 PM Herby AbrahamJohnson, Shiquita C wrote:   Reason for CRM: pt's mom called in, pt has a new insurance which requires her to get a referral . Pt Is requesting a referral to see her OBGYN Dr. Delton SeeGretchin Adkin at [facility] [address] [place]. Pt has an apt scheduled for 10/06/17

## 2017-09-28 NOTE — Telephone Encounter (Signed)
Referral placed for patient request.  Pt aware. Nothing further needed.

## 2017-09-29 ENCOUNTER — Ambulatory Visit (INDEPENDENT_AMBULATORY_CARE_PROVIDER_SITE_OTHER): Payer: No Typology Code available for payment source | Admitting: Licensed Clinical Social Worker

## 2017-09-29 ENCOUNTER — Encounter (HOSPITAL_COMMUNITY): Payer: Self-pay | Admitting: Licensed Clinical Social Worker

## 2017-09-29 DIAGNOSIS — F341 Dysthymic disorder: Secondary | ICD-10-CM | POA: Diagnosis not present

## 2017-09-29 NOTE — Progress Notes (Signed)
   THERAPIST PROGRESS NOTE  Session Time: 12:30pm-1:00pm  Participation Level: Active  Behavioral Response: CasualAlertDysphoric  Type of Therapy: Individual Therapy   Treatment Goals addressed: Diagnosis: major depressive disorder, recurrent episode, moderate    Interventions: CBT and Motivational Interviewing   Summary: LORRAINE CIMMINO is a 22 y.o. female who presents with Persistent Depressive Disorder   Suicidal/Homicidal: No without intent/plan   Therapist Response: [name] met with clinician for an individual session. [name] discussed her psychiatric symptoms, her current life events and her homework. [name] shared that she continues to feel down and presented with flat affect. [name] reports she has been getting along okay with parents and she is starting to plan to return to [employer] in the fall 2019. [name] expressed concerns that mom does not see much of a change in her. However, when this was questioned in session, Joellen Jersey did not report that she felt much different over the past several months. 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 She reports it is "okay", but was unable to identify much in her life that makes her feel really happy. She denies thoughts of self injury. She also reports she will be starting classes at [employer] next week to get herself back into the school mindset.   Plan: Return again in 1-2 weeks.   Diagnosis:      Axis I: Persistent Depressive Disorder  Mindi Curling, LCSW 09/29/2017

## 2017-10-19 ENCOUNTER — Encounter (HOSPITAL_COMMUNITY): Payer: Self-pay | Admitting: Licensed Clinical Social Worker

## 2017-10-19 ENCOUNTER — Ambulatory Visit (INDEPENDENT_AMBULATORY_CARE_PROVIDER_SITE_OTHER): Payer: No Typology Code available for payment source | Admitting: Licensed Clinical Social Worker

## 2017-10-19 DIAGNOSIS — F341 Dysthymic disorder: Secondary | ICD-10-CM | POA: Diagnosis not present

## 2017-10-19 NOTE — Progress Notes (Signed)
   THERAPIST PROGRESS NOTE  Session Time: 1:30pm-2:15pm  Participation Level: Active  Behavioral Response: CasualAlertDysphoric   Type of Therapy: Individual Therapy   Treatment Goals addressed: Diagnosis: major depressive disorder, recurrent episode, moderate    Interventions: CBT and Motivational Interviewing   Summary: NARYAH CLENNEY is a 22 y.o. female who presents with Persistent Depressive Disorder   Suicidal/Homicidal: No without intent/plan   Therapist Response: [name] met with clinician for an individual session. [name] discussed her psychiatric symptoms, her current life events and her homework. [name] shared that she has started classes at [employer] and they are going well so far. She has one friend in one class that she sits with, which has been helpful. She also identifies that her medication seems to be helping a little more. Joellen Jersey continues to present with flat affect and low energy. However, she reports that she has started exercising daily at the [facility], which has improved her overall mood and activity level. Clinician explored plans for returning to [facility] [employer] [facility] in the fall and discussed ways to improve experience. Clinician also explored options for living in dorms, working on campus, or joining student groups to engage with others.   Plan: Return again in 2-3 weeks.   Diagnosis:      Axis I: Persistent Depressive Disorder  Mindi Curling, LCSW 10/19/2017

## 2017-10-29 ENCOUNTER — Ambulatory Visit (HOSPITAL_COMMUNITY): Payer: Self-pay | Admitting: Licensed Clinical Social Worker

## 2017-11-05 ENCOUNTER — Ambulatory Visit (HOSPITAL_COMMUNITY): Payer: Self-pay | Admitting: Licensed Clinical Social Worker

## 2017-11-12 ENCOUNTER — Ambulatory Visit (HOSPITAL_COMMUNITY): Payer: Self-pay | Admitting: Licensed Clinical Social Worker

## 2017-11-19 ENCOUNTER — Ambulatory Visit (HOSPITAL_COMMUNITY): Payer: Self-pay | Admitting: Licensed Clinical Social Worker

## 2017-12-07 ENCOUNTER — Ambulatory Visit (INDEPENDENT_AMBULATORY_CARE_PROVIDER_SITE_OTHER): Payer: No Typology Code available for payment source | Admitting: Licensed Clinical Social Worker

## 2017-12-07 ENCOUNTER — Encounter (HOSPITAL_COMMUNITY): Payer: Self-pay | Admitting: Licensed Clinical Social Worker

## 2017-12-07 DIAGNOSIS — F341 Dysthymic disorder: Secondary | ICD-10-CM | POA: Diagnosis not present

## 2017-12-07 NOTE — Progress Notes (Signed)
   THERAPIST PROGRESS NOTE  Session Time: 1:30pm-2:30pm  Participation Level: Active  Behavioral Response: NeatAlertIrritable  Type of Therapy: Individual Therapy   Treatment Goals addressed: Persistent Depressive Disorder   Interventions: CBT and Motivational Interviewing   Summary: Lucia EstelleKathryn M Bottari is a 22 y.o. female who presents with Major Depressive Disorder, recurrent episode, moderate.   Suicidal/Homicidal: No without intent/plan   Therapist Response: [name] engaged well in session and discussed a recent experience of looking back on her life, especially what caused her to [facility] [employer]. Clinician explored thoughts and feelings about what was happening then and utilized CBT to identify stressors. Clinician explored changes that have been made since she has been home. [name] identified stressors of new house, new roommates, not seeing friends, isolating, being overwhelmed with classes, body dysmorphia, and no therapy as a combination that led to extreme depression. [name] identified that she is now feeling better, consistently on medication, coming to therapy, and making more of an effort to exercise and be social. Clinician explored readiness to return to [employer] and her timeline. [name] reports she has been thinking about going to the [employer], which is 2 weeks long. [name] reports mom wants her to enroll, but [name] has been putting it off. Clinician explored roadblocks and noted that stress from parents makes her more defiant. Clinician reminded [name] that the ultimate goal for everyone in her life is for [name] to be successful and happy. [name] agreed and was willing to contact college advisor to find out more information. She also agreed to make a list of tasks and start completing them.   Plan: Return again in 2-3 weeks.   Diagnosis:      Axis I: Persistent Depressive Disorder  Veneda MelterJessica R Constant [place] 12/07/2017

## 2017-12-21 ENCOUNTER — Ambulatory Visit (HOSPITAL_COMMUNITY): Payer: No Typology Code available for payment source | Admitting: Licensed Clinical Social Worker

## 2017-12-21 ENCOUNTER — Encounter (HOSPITAL_COMMUNITY): Payer: Self-pay | Admitting: Licensed Clinical Social Worker

## 2017-12-21 DIAGNOSIS — F341 Dysthymic disorder: Secondary | ICD-10-CM

## 2017-12-21 NOTE — Progress Notes (Signed)
   THERAPIST PROGRESS NOTE  Session Time: 1:30pm-2:30pm  Participation Level: Active  Behavioral Response: Well GroomedAlertEuthymic  Type of Therapy: Individual Therapy   Treatment Goals addressed: Diagnosis: Persistent Depressive Disorder   Interventions: CBT and Motivational Interviewing   Summary: Rebekah Tate is a 22 y.o. female who presents with Persistent Depressive Disorder   Suicidal/Homicidal: No without intent/plan   Therapist Response: Rebekah Tate engaged well in session and discussed her plans for re-admission to [facility]. Rebekah Tate reports she has been accepted and she plans to go back for the brief "[place]" from May 15-31. Clinician discussed thoughts and feelings about her mood, ability to focus and function on her own, and plans to establish [place] in [place]. Clinician discussed options for transitioning [facility] and identified the option for remaining with Dr. Rene KocherEksir for medication management, but moving therapy to someone in [place]. Clinician discussed the importance of having basic safety and security in place for housing, friends, exercise. Clinician also encouraged time to spend in [place] in order to adjust to the setting and find her way around.    Plan: Return again in 1-2 weeks.   Diagnosis:      Axis I: Persistent Depressive Disorder  Veneda MelterJessica R Leinani Lisbon, LCSW 12/21/2017

## 2018-01-04 ENCOUNTER — Ambulatory Visit (HOSPITAL_COMMUNITY): Payer: Self-pay | Admitting: Licensed Clinical Social Worker

## 2018-01-18 ENCOUNTER — Ambulatory Visit (HOSPITAL_COMMUNITY): Payer: No Typology Code available for payment source | Admitting: Licensed Clinical Social Worker

## 2018-05-06 ENCOUNTER — Other Ambulatory Visit (HOSPITAL_COMMUNITY): Payer: Self-pay | Admitting: Psychiatry

## 2018-05-06 DIAGNOSIS — F332 Major depressive disorder, recurrent severe without psychotic features: Secondary | ICD-10-CM

## 2018-05-06 NOTE — Telephone Encounter (Signed)
I have note been prescribing this medication   And havene seen her  But one time  She is in behavioral health    Please see  Who is providing her psych care and  And send refill request to them

## 2018-05-06 NOTE — Telephone Encounter (Signed)
Patient no longer under my care, requesting refill of Effexor.

## 2018-06-19 ENCOUNTER — Ambulatory Visit (HOSPITAL_COMMUNITY): Payer: Self-pay | Admitting: Psychiatry

## 2018-08-18 ENCOUNTER — Ambulatory Visit: Payer: No Typology Code available for payment source | Admitting: Family Medicine

## 2018-08-18 ENCOUNTER — Encounter: Payer: Self-pay | Admitting: Family Medicine

## 2018-08-18 VITALS — BP 100/62 | Ht 60.0 in | Wt 99.0 lb

## 2018-08-18 DIAGNOSIS — M545 Low back pain, unspecified: Secondary | ICD-10-CM

## 2018-08-18 NOTE — Progress Notes (Signed)
PCP: Madelin HeadingsPanosh, Wanda K, MD  Subjective:   HPI: Patient is a 22 y.o. female here for evaluation of low back pain. Patient has had back pain for the last several years, but this acutely worsened over the last 2 months. She does have a history of scoliosis with 30 degrees of angulation in T4-T5 and 22 degrees of angulation at T11. She was last seen for her back pain over a year ago and was given stretching and strengthening exercises. She has since stopped doing these exercises as her back pain intermittently improved. She denies any recent injury or trauma that sparked the back pain. She has no pain that shoots down her leg. The pain is mostly localized in her right SI joint, low back but she does have pain in her upper thoracic spine along the paraspinal muscles as well. She has no bowel or bladder incontinence. Since her pain restarted, she has done stretching exercises as well as a foam roller, but has not done any strengthening exercises for her core. She has not taken any ibuprofen or tylenol as she does not like taking pain medications. Patient is currently a Consulting civil engineerstudent and notes she spends a lot of time sitting at a desk/computer and needs to carry a heavy backpack around between classes.  No numbness.  No bowel/bladder dysfunction.  Past Medical History:  Diagnosis Date  . Allergy   . Anxiety   . Asthma    As a child uses as needed albuterol when flares exercise and respiratory infections.  . Depression    meds since 9th grade  dr Lucianne Musskumar  . Scoliosis   . Seasonal allergies     Current Outpatient Medications on File Prior to Visit  Medication Sig Dispense Refill  . albuterol (PROVENTIL HFA) 108 (90 BASE) MCG/ACT inhaler Inhale 2 puffs into the lungs daily as needed for wheezing or shortness of breath.    . Norethindrone Acetate-Ethinyl Estrad-FE (LOESTRIN 24 FE) 1-20 MG-MCG(24) tablet Take 1 tablet by mouth daily.    . Venlafaxine HCl 225 MG TB24   0  . traZODone (DESYREL) 50 MG tablet Take 50  mg by mouth at bedtime.     No current facility-administered medications on file prior to visit.     Past Surgical History:  Procedure Laterality Date  . NO PAST SURGERIES      Allergies  Allergen Reactions  . Cat Hair Extract Other (See Comments)    Sneezing, rash, hives    Social History   Socioeconomic History  . Marital status: Single    Spouse name: Not on file  . Number of children: Not on file  . Years of education: Not on file  . Highest education level: Not on file  Occupational History  . Occupation: Consulting civil engineertudent  Social Needs  . Financial resource strain: Not on file  . Food insecurity:    Worry: Not on file    Inability: Not on file  . Transportation needs:    Medical: Not on file    Non-medical: Not on file  Tobacco Use  . Smoking status: Never Smoker  . Smokeless tobacco: Never Used  Substance and Sexual Activity  . Alcohol use: No    Alcohol/week: 0.0 standard drinks  . Drug use: No  . Sexual activity: Never  Lifestyle  . Physical activity:    Days per week: Not on file    Minutes per session: Not on file  . Stress: Not on file  Relationships  . Social connections:  Talks on phone: Not on file    Gets together: Not on file    Attends religious service: Not on file    Active member of club or organization: Not on file    Attends meetings of clubs or organizations: Not on file    Relationship status: Not on file  . Intimate partner violence:    Fear of current or ex partner: Not on file    Emotionally abused: Not on file    Physically abused: Not on file    Forced sexual activity: Not on file  Other Topics Concern  . Not on file  Social History Narrative   Adopted from [place] when she was [age] year old. Intact family.   Attended [employer]. Went to [employer] for 2 years and didn't like it although good grades early of education.   Applying to other colleges considering state and [employer]   Negative TAD does regular exercise reports  2-3 hours of sleep; lives with parents and a dog at this time feels like she has a healthy situation   GOP 0 last Pap 06/20/2016    Family History  Adopted: Yes  Family history unknown: Yes    BP 100/62   Ht 5' (1.524 m)   Wt 99 lb (44.9 kg)   BMI 19.33 kg/m   Review of Systems: See HPI above.     Objective:  Physical Exam:  Gen: NAD, comfortable in exam room Resp: Breathing comfortably, no acute distress Circ: Extremities warm and well perfused MSK- Back/hips -Inspection: Angulation noted in thoracic and lumbar spine. Appears to be unchanged after reviewing xrays from 2 years ago.  No leg length discrepancy. -Palpation: Tenderness along right SI joint and paraspinal muscles in low lumbar and thoracic spine. Otherwise no bony tenderness. Mild tenderness to palpation along iliac crests bilaterally. -ROM: Normal range of motion with flexion, extension, lateral rotation bilaterally, and bending lateral bending bilaterally. Patient did have some pain with lateral bending on both sides. No significant pain with back extension. Normal ROM with hip flexion, extension, abduction, internal rotation, external rotation -Strength: 4/5 strength with hip flexion, abduction, extension. 5/5 strength with internal and external rotation -Reflexes: 2+ reflexes in patellar and achilles tendons bilaterally -Sensation: Normal sensation in lower extremities -FABER: Negative -FADIR: Negative -Straight leg: Negative -Slump: Negative   Bilateral hips: No deformity. FROM with 4/5 bilateral hip flexion, abduction, extension. No tenderness to palpation. NVI distally.  Assessment & Plan:  Patient is a 22 y.o. female here for evaluation of low back pain  1. Low back pain -Patient with history of scoliosis, however cause of pain is likely related to weakness of hip stabilizers and core strength -Will obtain lumbar spine xrays to rule out pars defect -Once xrays are reviewed and if patient does not  have fracture, will refer to physical therapy for core strengthening -Advised patient to avoid foam roller over bony prominences -May take NSAIDs as needed for pain - Heat as needed.  Follow up in 6 weeks

## 2018-08-18 NOTE — Patient Instructions (Signed)
Get x-rays of your lumbar spine after you leave today - we will contact you with the results. If these look ok we will start you in physical therapy. Do home exercises on days you don't go to therapy. Heat 15 minutes at a time 3-4 times a day. Tylenol and/or aleve only as needed for pain. Follow up in 6 weeks.

## 2018-09-03 ENCOUNTER — Ambulatory Visit
Admission: RE | Admit: 2018-09-03 | Discharge: 2018-09-03 | Disposition: A | Discharge status: Home / Self Care | Payer: No Typology Code available for payment source | Source: Ambulatory Visit | Attending: Family Medicine | Admitting: Family Medicine

## 2018-09-03 ENCOUNTER — Encounter: Payer: Self-pay | Admitting: Radiology

## 2018-09-03 DIAGNOSIS — M545 Low back pain, unspecified: Secondary | ICD-10-CM

## 2018-09-07 ENCOUNTER — Telehealth: Payer: Self-pay

## 2018-09-07 NOTE — Telephone Encounter (Signed)
Will relay message to pt when she returns my call.

## 2018-09-23 ENCOUNTER — Encounter: Payer: Self-pay | Admitting: Sports Medicine

## 2018-09-23 ENCOUNTER — Ambulatory Visit (INDEPENDENT_AMBULATORY_CARE_PROVIDER_SITE_OTHER): Payer: No Typology Code available for payment source | Admitting: Sports Medicine

## 2018-09-23 DIAGNOSIS — M41124 Adolescent idiopathic scoliosis, thoracic region: Secondary | ICD-10-CM | POA: Diagnosis not present

## 2018-09-23 DIAGNOSIS — M545 Low back pain, unspecified: Secondary | ICD-10-CM | POA: Insufficient documentation

## 2018-09-23 NOTE — Assessment & Plan Note (Signed)
I suggested that she resume doing the scoliosis exercises The should be helpful over the long-term

## 2018-09-23 NOTE — Patient Instructions (Signed)
For your low back  Resume doing scoliosis exercises easy T rotations and lateral bends with light weight Do these daily for a part of your workout  For low back  Change up your abdominals Crunches in 3 positions Add up some of the other abdominal exercises you do Pelvic tilts Build back to doing back bridges  If medicine needed - Aleve is fine  Therma wrap or Salon Pas or similar back wraps give heat for ~ 12 hours Good to use for acute flares  Try these and see how you do  [facility]!

## 2018-09-23 NOTE — Assessment & Plan Note (Signed)
I think this is more mechanical in nature No red flags or serious findings on history or exam  I gave her a series of exercises to help improve this She definitely needs to work on her abdominal core program Aleve if needed Heat if needed Recheck PRN

## 2018-09-23 NOTE — Progress Notes (Signed)
Patient comes for follow-up of low back pain  Patient with known scoliosis who saw Dr. Pearletha Forge November 27 She has been doing really well She does regular exercises for her back and works out almost every day She is now a Consulting civil engineer at [employer] She carries a backpack to class and thinks that sometimes this aggravates her back  She feels that the back worsened because she got out of doing the exercises and she was playing games and puzzles in an awkward seated position  Past medical history Depression and sleep issues have been stable She has stopped the trazodone She feels happier now that she is in a program that she enjoys  Review of systems No sciatica No other radiating symptoms No weakness in her lower extremities  Physical exam Pleasant, physically fit young lady in no acute distress BP 112/68   Ht 5' (1.524 m)   Wt 98 lb (44.5 kg)   BMI 19.14 kg/m   Inspection of the back reveals that she has the obvious scoliotic curve but is well compensated with good posture She has some minimal tenderness in the lumbar area more to the midline Straight leg raise is negative bilaterally Heel and toe walk are normal Sensation is normal She can do back bridges with no significant pain She can do trunk crunches She gets some mild back tightness with knee-to-chest and knee to shoulder

## 2019-04-27 ENCOUNTER — Telehealth: Payer: Self-pay

## 2019-04-27 NOTE — Telephone Encounter (Signed)
Tried to call pt to set up virtual visit lvm crm created

## 2019-04-27 NOTE — Telephone Encounter (Signed)
Ok  We can work her in if she calls back  .

## 2019-04-27 NOTE — Telephone Encounter (Signed)
Copied from [place] [id number]. Topic: General - Other >> Apr 19, 2019  3:12 PM Wynetta Emery, [place] C wrote: Reason for CRM: pt called in to be advised by her provider. Pt says that she was around someone who tested positive for covid. Pt says that it was on 7/19. Pt would like to be advised, should she be tested? Pt isn't currently having any symptoms.   CB: [phone] >> Apr 19, 2019  4:38 PM Cox, Melburn Hake, CMA wrote: Please offer virtual visit to discuss options.

## 2019-06-20 ENCOUNTER — Encounter: Payer: Self-pay | Admitting: Internal Medicine

## 2019-06-20 ENCOUNTER — Telehealth (INDEPENDENT_AMBULATORY_CARE_PROVIDER_SITE_OTHER): Payer: No Typology Code available for payment source | Admitting: Internal Medicine

## 2019-06-20 ENCOUNTER — Other Ambulatory Visit: Payer: Self-pay

## 2019-06-20 DIAGNOSIS — K5900 Constipation, unspecified: Secondary | ICD-10-CM | POA: Diagnosis not present

## 2019-06-20 DIAGNOSIS — R194 Change in bowel habit: Secondary | ICD-10-CM | POA: Diagnosis not present

## 2019-06-20 NOTE — Progress Notes (Signed)
Virtual Visit via Video Note  I connected with@ on 06/20/19 at 11:00 AM EDT by a video enabled telemedicine application and verified that I am speaking with the correct person using two identifiers. Location patient: home Location provider:work  office Persons participating in the virtual visit: patient, provider  [place] national recommendations  regarding COVID 19 pandemic   video visit is advised over in office visit for this patient.  Patient aware  of the limitations of evaluation and management by telemedicine and  availability of in person appointments. and agreed to proceed.   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 She states this began around August 3 and is continued where she has very infrequent bowel movements.  Originally taking MiraLAX 3 capfuls a day and occasional Dulcolax which was not helpful [facility] x-ray showed no acute obstruction but some stool burden by report was also given Linzess trial which "did not help".  She has had no fever unusual weight loss blood in the stool or vomiting. She has been on high-dose Effexor for at least a year 225 mg and did not noted related to change.  Baseline bowel habits were like every other day. She is on continuous OCPs. The providers at [facility] advised to GI referral however because of insurance network needs to be referred by PCP in the [place] area.  Last visit with me was 2 years ago .    ROS: See pertinent positives and negatives per HPI. She lives off campus right next to campus in an Apt. 2 other roommates pretty much isolated to hunker down will take walks.  Is under care psychiatry wise with her meds.  Majoring in communications. Past Medical History:  Diagnosis Date  . Allergy   . Anxiety   . Asthma    As a child uses as needed albuterol when  flares exercise and respiratory infections.  . Depression    meds since 9th grade  dr Lucianne Muss  . Scoliosis   . Seasonal allergies     Past Surgical History:  Procedure Laterality Date  . NO PAST SURGERIES      Family History  Adopted: Yes  Family history unknown: Yes    Social History   Tobacco Use  . Smoking status: Never Smoker  . Smokeless tobacco: Never Used  Substance Use Topics  . Alcohol use: No    Alcohol/week: 0.0 standard drinks  . Drug use: No      Current Outpatient Medications:  .  albuterol (PROVENTIL HFA) 108 (90 BASE) MCG/ACT inhaler, Inhale 2 puffs into the lungs daily as needed for wheezing or shortness of breath., Disp: , Rfl:  .  Norethindrone Acetate-Ethinyl Estrad-FE (LOESTRIN 24 FE) 1-20 MG-MCG(24) tablet, Take 1 tablet by mouth daily., Disp: , Rfl:  .  Venlafaxine HCl 225 MG TB24, , Disp: , Rfl: 0  EXAM: BP Readings from Last 3 Encounters:  09/23/18 112/68  08/18/18 100/62  12/09/16 (!) 104/57    VITALS per patient if applicable:  GENERAL: alert, oriented, appears well and in no acute distress  HEENT: atraumatic, conjunttiva clear, no obvious abnormalities on inspection of external nose and ears NECK: normal movements of the head and neck LUNGS: on inspection no signs of respiratory distress, breathing rate appears normal, no obvious gross SOB, gasping or wheezing CV: no obvious  cyanosis MS: moves all visible extremities without noticeable abnormality PSYCH/NEURO: pleasant and cooperative, no obvious depression or anxiety, speech and thought processing grossly intact Lab Results  Component Value Date   WBC 5.2 08/21/2016   HGB 13.3 08/21/2016   HCT 40.9 08/21/2016   PLT 285 08/21/2016   GLUCOSE 83 08/21/2016   CHOL 157 08/21/2016   TRIG 81 08/21/2016   HDL 75 08/21/2016   LDLCALC 66 08/21/2016   ALT 14 08/21/2016   ALT 15 08/21/2016   AST 20 08/21/2016   AST 19 08/21/2016   NA 139 08/21/2016   K 4.0 08/21/2016   CL 105  08/21/2016   CREATININE 0.87 08/21/2016   BUN 16 08/21/2016   CO2 24 08/21/2016   TSH 2.194 08/21/2016    ASSESSMENT AND PLAN:  Discussed the following assessment and plan:    ICD-10-CM   1. Constipation, unspecified constipation type  K59.00 Ambulatory referral to Gastroenterology  2. Recent change in frequency of bowel movements  R19.4 Ambulatory referral to Gastroenterology   Apparently failed treatment for constipation including MiraLAX 3 times a day and Linzess.  Timing could be related to school however she states it predated classes.  And no major changes in diet and activity. She also states that her medications did not change related. We will do referral and if possible get the most recent records from [facility].  Any x-ray and labs done. Counseled.   Expectant management and discussion of plan and treatment with opportunity to ask questions and all were answered. The patient agreed with the plan and demonstrated an understanding of the instructions.   Advised to call back or seek an in-person evaluation if worsening  or having  further concerns .    Shanon Ace, MD

## 2019-06-25 IMAGING — CR DG LUMBAR SPINE COMPLETE 4+V
5 series · 5 of 5 positions shown · non-contrast
Comparison: None.

CLINICAL DATA: Acute bilateral low back pain without sciatica. No
injury.

EXAM:
LUMBAR SPINE - COMPLETE 4+ VIEW

[t l-spine a.p.]
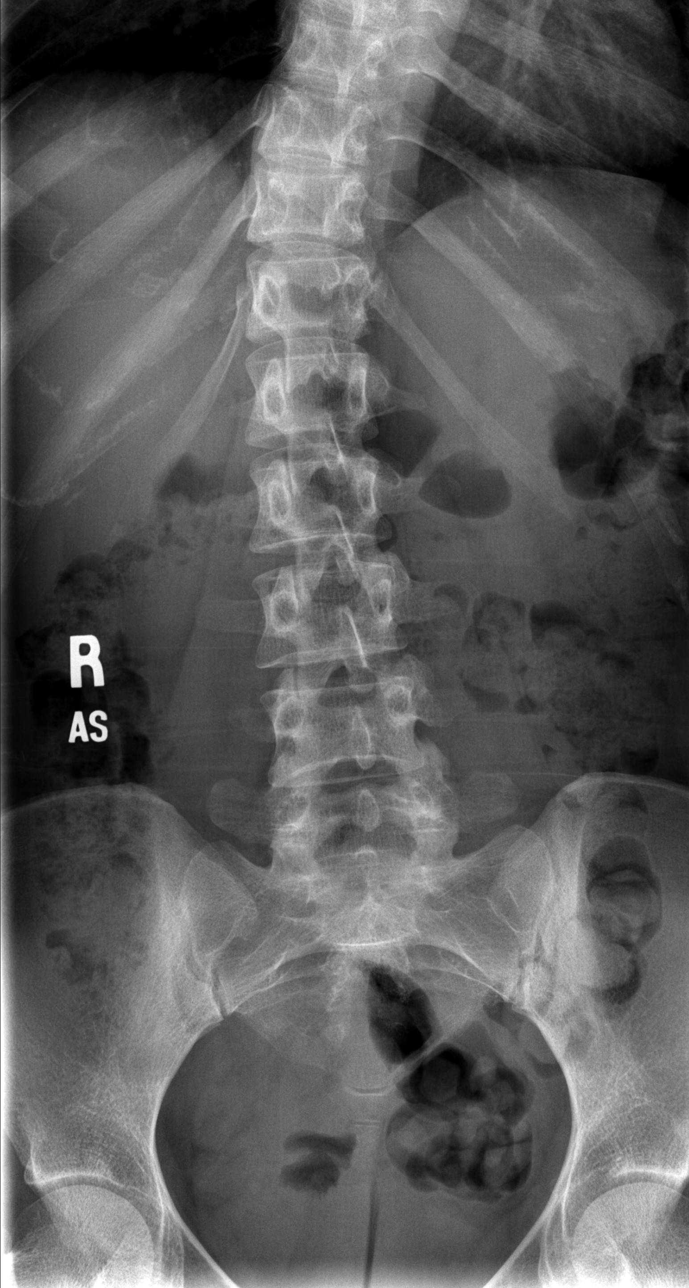

[t l-spine oblique exposure (1 of 2)]
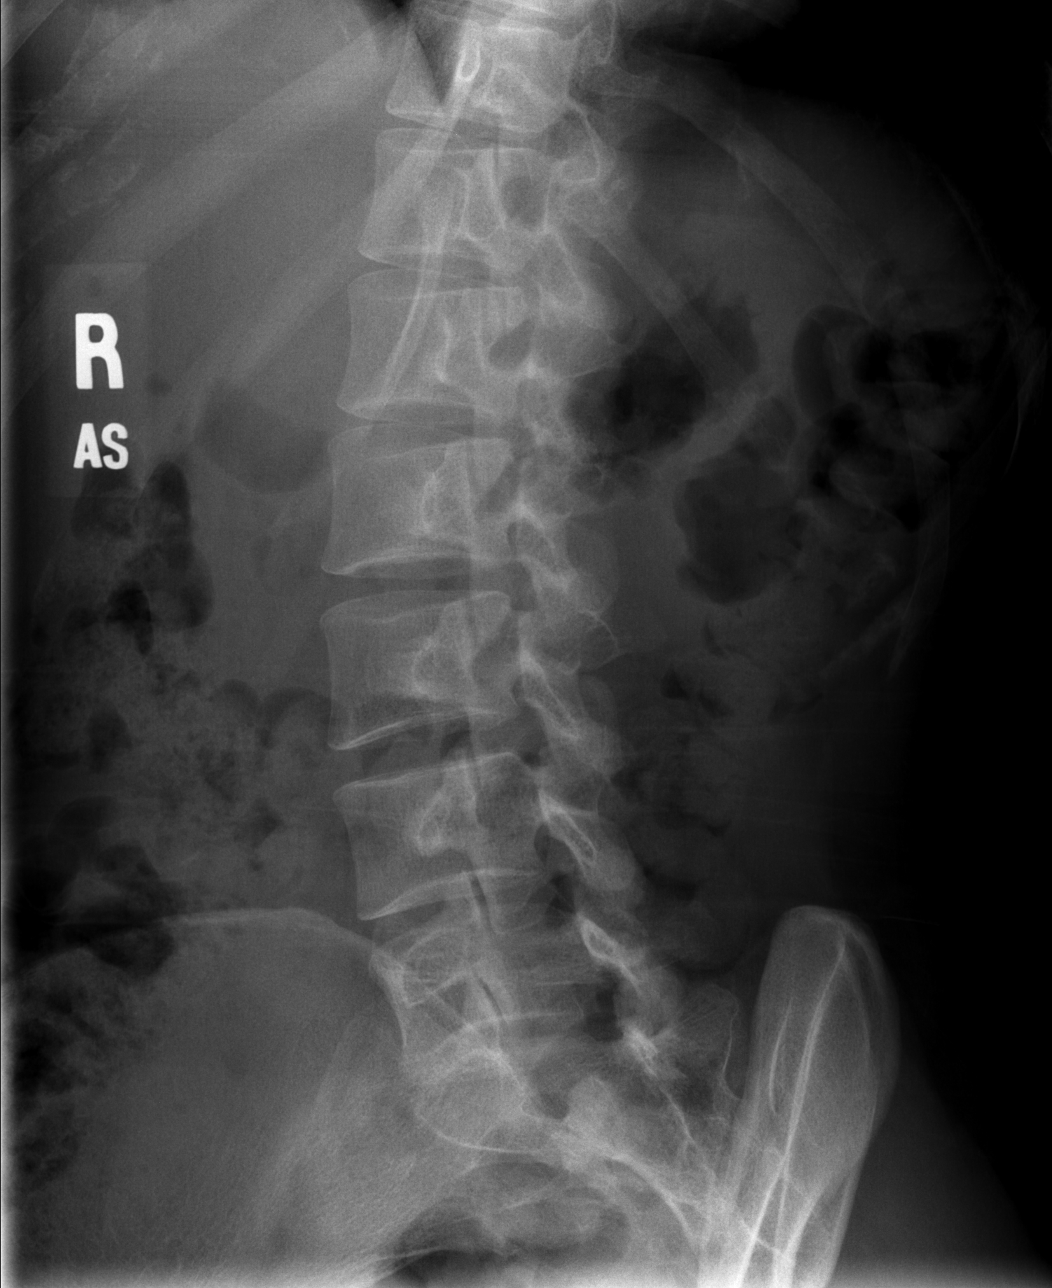

[t l-spine oblique exposure (2 of 2)]
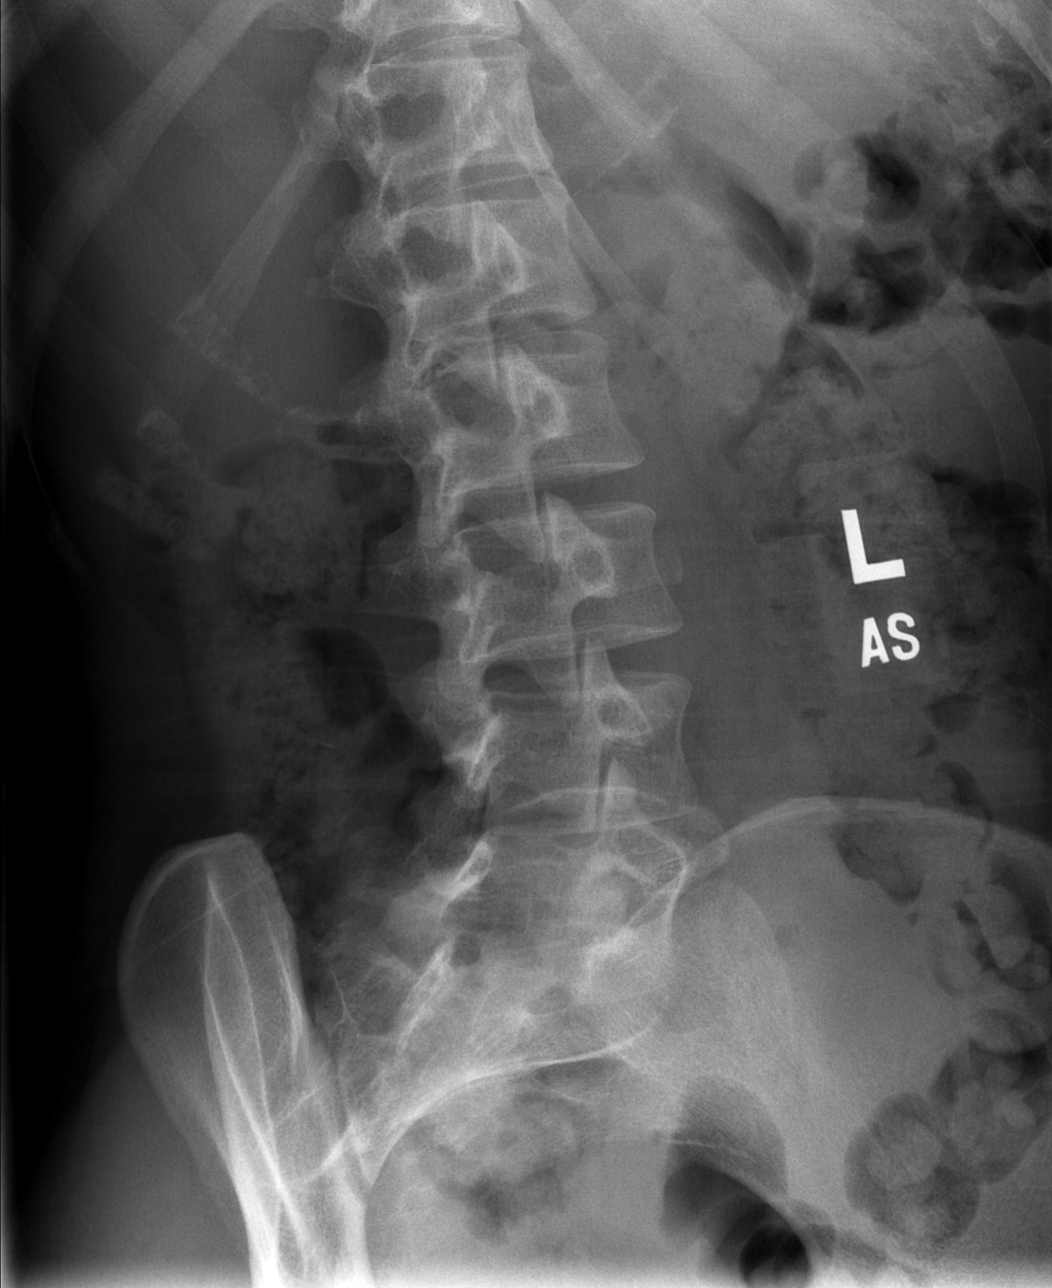

[t l-spine lat]
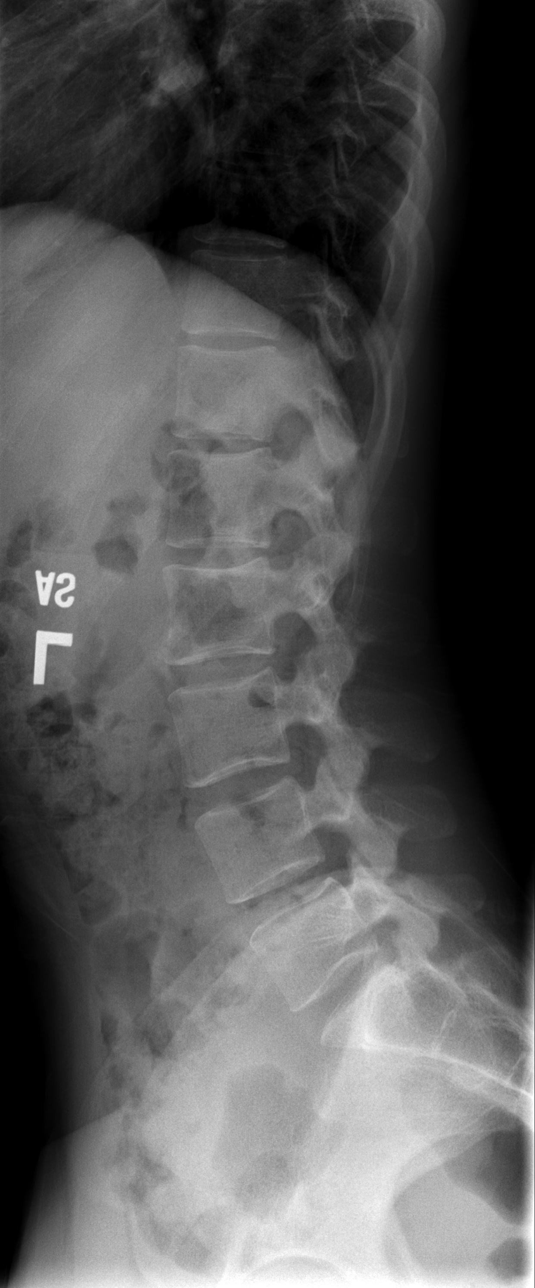

[t l-spine l5-s1 spot]
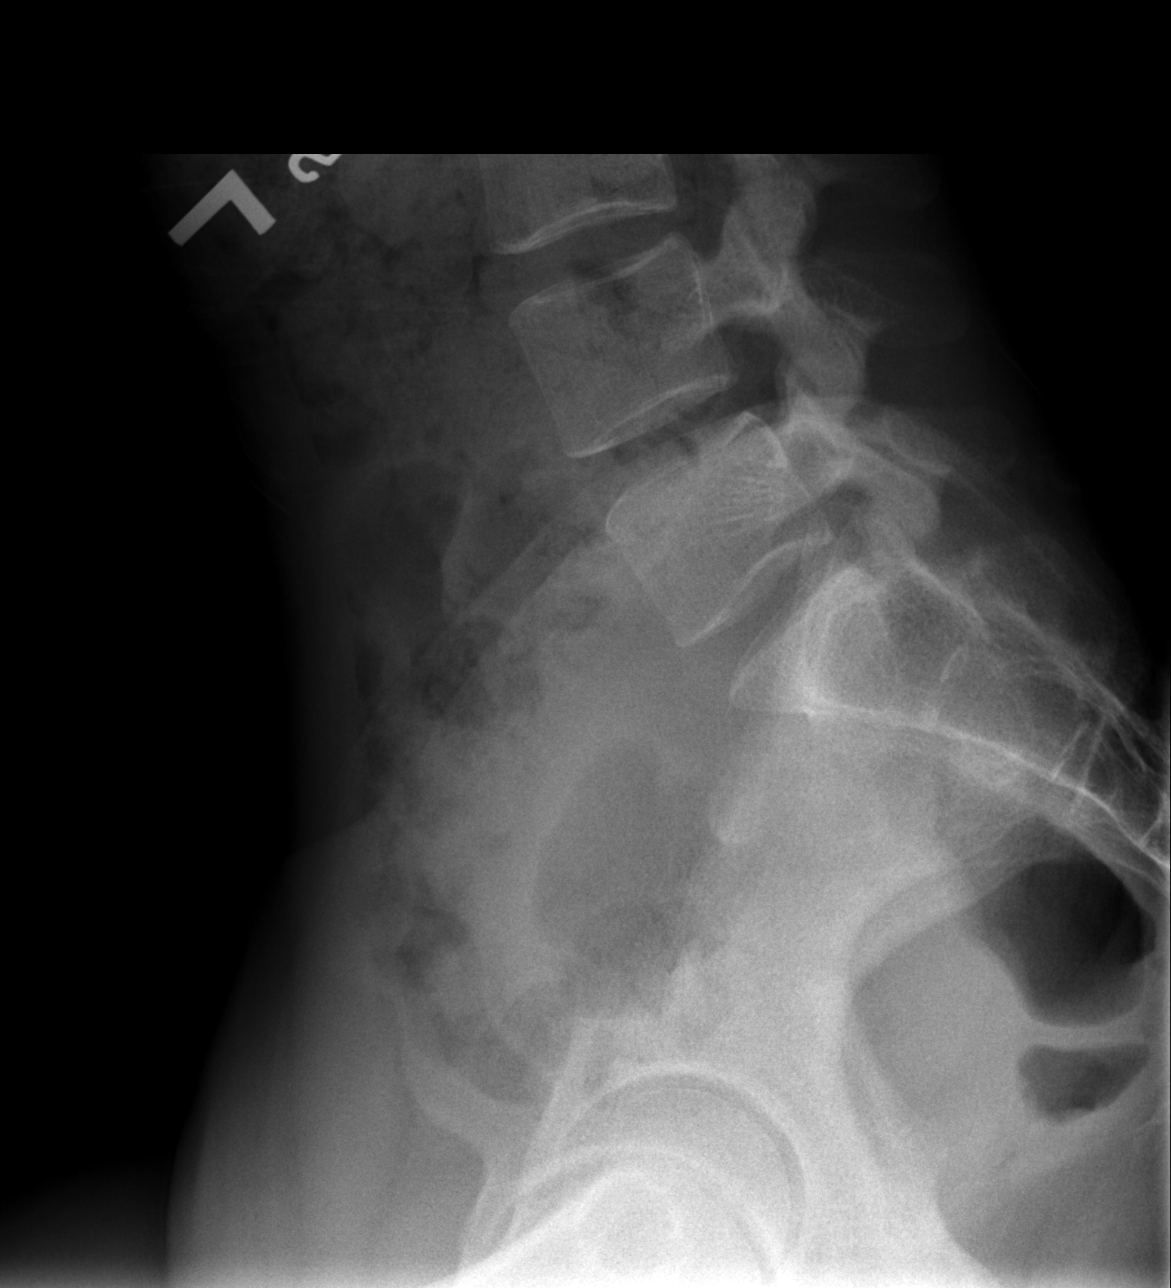

[5 of 5 positions shown; findings below may reference images not displayed]

FINDINGS: There is no evidence of lumbar spine fracture. Alignment is normal.
Intervertebral disc spaces are maintained.
IMPRESSION: Negative.

## 2019-07-08 ENCOUNTER — Ambulatory Visit (INDEPENDENT_AMBULATORY_CARE_PROVIDER_SITE_OTHER): Payer: No Typology Code available for payment source | Admitting: Gastroenterology

## 2019-07-08 ENCOUNTER — Other Ambulatory Visit: Payer: Self-pay

## 2019-07-08 ENCOUNTER — Encounter: Payer: Self-pay | Admitting: Gastroenterology

## 2019-07-08 VITALS — BP 112/62 | HR 55 | Temp 97.4°F | Ht 60.0 in | Wt 111.0 lb

## 2019-07-08 DIAGNOSIS — K5900 Constipation, unspecified: Secondary | ICD-10-CM | POA: Diagnosis not present

## 2019-07-08 DIAGNOSIS — R1084 Generalized abdominal pain: Secondary | ICD-10-CM

## 2019-07-08 MED ORDER — NA SULFATE-K SULFATE-MG SULF 17.5-3.13-1.6 GM/177ML PO SOLN: 1.0000 | Freq: Once | ORAL | 0 refills | Status: AC

## 2019-07-08 MED ORDER — DOCUSATE SODIUM 100 MG PO CAPS: 100.0000 mg | ORAL_CAPSULE | Freq: Two times a day (BID) | ORAL | 3 refills | Status: AC

## 2019-07-08 NOTE — Patient Instructions (Signed)
We have sent the following medications to your pharmacy for you to pick up at your convenience:  Colace, Suprep  Pour one bottle of the prep into the mixing container and add water to the 16 ounce line.  Mix and drink.  Follow with several more glasses of water.

## 2019-07-08 NOTE — Progress Notes (Signed)
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  Symptoms started over the last 3 months characterized by hard, pellet-like stools, straining to have BM. Symptoms started in 04/2019 with infrequent stools, treated with MiraLAX TID x2 weeks (no change in stool). Tried Dulcolax gummies but c/b liquid stools and upset stomach, so stopped. Was seen by provider at student health at [facility].  X-ray without acute obstruction and some stool burden.  Was started on Linzess trial x 2 weeks, without appreciable response.  No preceding changes in diet, medications, activity, etc. No hematochezia, melena. Has had intermittent generalized abdominal discomfort, with LLQ pain last night (resolved).  Has vomited 3 times over the last month or so.  No fever or weight loss.  Prior to this, had episodic constipation in the past, treated with increased water and occasional Miralax, and typically resolved on own. Last episode was a few years prior.  No prior EGD or colonoscopy.  Prior to symptom onset, baseline was 1 BM qod.  Does take Effexor high-dose (225 mg/day).  Presents today with her mother.   Past Medical History:  Diagnosis Date  . Allergy   . Anxiety   . Asthma    As a child uses as needed albuterol when flares exercise and respiratory infections.  . Depression    meds since 9th grade  dr Lucianne Muss  . Scoliosis   . Seasonal allergies      Past Surgical History:  Procedure Laterality Date  . NO PAST SURGERIES     Family History  Adopted: Yes  Family history unknown: Yes   Social History   Tobacco Use  . Smoking status: Never Smoker  . Smokeless tobacco: Never Used  Substance Use Topics  . Alcohol use: No    Alcohol/week: 0.0 standard drinks   . Drug use: No   Current Outpatient Medications  Medication Sig Dispense Refill  . albuterol (PROVENTIL HFA) 108 (90 BASE) MCG/ACT inhaler Inhale 2 puffs into the lungs daily as needed for wheezing or shortness of breath.    . Norethindrone Acetate-Ethinyl Estrad-FE (LOESTRIN 24 FE) 1-20 MG-MCG(24) tablet Take 1 tablet by mouth daily.    . Venlafaxine HCl 225 MG TB24   0   No current facility-administered medications for this visit.    Allergies  Allergen Reactions  . Cat Hair Extract Other (See Comments)    Sneezing, rash, hives     Review of Systems: All systems reviewed and negative except where noted in HPI.     Physical Exam:    Wt Readings from Last 3 Encounters:  07/08/19 111 lb (50.3 kg)  09/23/18 98 lb (44.5 kg)  08/18/18 99 lb (44.9 kg)    BP 112/62   Pulse (!) 55   Temp (!) 97.4 F (36.3 C)   Ht 5' (1.524 m)   Wt 111 lb (50.3 kg)   BMI 21.68 kg/m  Constitutional:  Pleasant, in no acute distress. Psychiatric: Normal mood and affect. Behavior is normal. EENT: Pupils normal.  Conjunctivae are normal. No scleral icterus. Neck supple. No cervical LAD. Cardiovascular: Normal rate, regular rhythm. No edema Pulmonary/chest: Effort normal and breath sounds normal. No wheezing, rales or rhonchi. Abdominal: Soft,  nondistended, nontender. Bowel sounds active throughout. There are no masses palpable. No hepatomegaly. Neurological: Alert and oriented to person place and time. Skin: Skin is warm and dry. No rashes noted.   ASSESSMENT AND PLAN;   1) Constipation 2) Generalized abdominal pain  - Will trial bowel purge with Suprep - Colace 100 mg PO BID- start after bowel prep complete then resume and titrate when having regular, soft stools - Continue hydration (drinks >80 oz water/day) -Discussed the role utility of colonoscopy today.  She would rather treat as above, and reserve colonoscopy if needed in future - If sxs not resolved with bowel prep, or recurrent  symptoms, then will plan on colonoscopy to evaluate for luminal/mucosal etiology -While Effexor carries a ADR of constipation 9%, has been on the same dose for more than a year -Can update me by phone on efficacy of the above treatment, and otherwise follow-up with me prn   Lavena Bullion, DO, FACG  07/08/2019, 10:35 AM   Panosh, Standley Brooking, MD

## 2019-07-09 ENCOUNTER — Telehealth: Payer: Self-pay | Admitting: Internal Medicine

## 2019-07-09 NOTE — Telephone Encounter (Signed)
Contacted by patient's boyfriend yesterday at 19:50 Patient seen yesterday by Dr. Bryan Lemma for constipation and given Suprep as a bowel purge.  She took the first half of Suprep as instructed and about 2 hours later developed uncomfortable abdominal bloating feeling and pain.  No bowel movement.  Nausea without vomiting.  Her boyfriend asking what she is supposed to do as she is very uncomfortable.  I instructed that she stop taking the additional bowel preparation at this time.  It is very likely that her pain and bloating are coming from the laxative and the fact that she has not yet had a bowel movement.  I instructed her to get the bowel preparation more time to begin working and when she is having bowel movements her abdominal pain and pressure/bloating should feel improved.  If no bowel movement within the next 2 hours then I recommended an over-the-counter fleets enema x1.  Once she is going to the bathroom and if her pain and bloating symptom resolves she can take the additional half of the bowel preparation if deemed necessary.  I instructed that she go to the ER if pain worsens/becomes intolerable.  I instructed they could call me back with further questions which at this point they have not.  Time provided for questions and answers.  They voiced understanding and thanked me for the call.  CC Dr. Bryan Lemma

## 2019-07-11 NOTE — Telephone Encounter (Signed)
Called and spoke with patient-informed patient of MD recommendations and patient reports she did not try to the fleets enemas-will try those and follow through with the Miralax as recommended by MD and update office if need arises; Patient advised to call back to the office at [phone] should questions/concerns arise;Patient verbalized understanding of information/instructions;

## 2019-07-11 NOTE — Telephone Encounter (Signed)
Thank you Ulice Dash.  Lesly Rubenstein, can you please contact [name] today to follow-up with her for any improvement. Thanks.

## 2019-07-11 NOTE — Telephone Encounter (Signed)
Called and spoke with patient-patient reports she is taking the Colace in AM and in PM; no relief noted at this time; patient reports she is not voiding clears yet-still not really able to have any more bowel movements (has had three bowel movements during the night)-patient reports she still feels like she has stool "inside"; patient reports not having any abdominal pain at this time;  Please advise due to patient reports she does not feel like she has "emptied myself out yet";

## 2019-07-11 NOTE — Telephone Encounter (Signed)
Thanks for contacting her.  That she trial of fleets enemas as outlined by Dr. Hilarie Fredrickson?  If she did that, and completed the bowel prep, but still not improving, can continue Colace and add MiraLAX bid-tid back to the regimen.

## 2019-07-20 ENCOUNTER — Telehealth: Payer: Self-pay | Admitting: Gastroenterology

## 2019-07-20 NOTE — Telephone Encounter (Signed)
Been doing everything Dr. Bryan Lemma- taking everything he told her to. It has helped a little bit but still not having normal bowel movements. This morning again she had a whole pill in her stool. She is wondering what she needs to do to help.

## 2019-07-20 NOTE — Telephone Encounter (Signed)
Attempted to reach patient-unable to leave a VM as mailbox is full-will attempt to reach patient at a later date/time;   

## 2019-07-22 ENCOUNTER — Ambulatory Visit: Payer: No Typology Code available for payment source | Admitting: Internal Medicine

## 2019-07-22 NOTE — Telephone Encounter (Signed)
Patient reports she has used the fleets enemas (4 in total), using the Miralax and Colace and is still not emptying out completely -is having easier bowel movements-concerned about the "whole pill coming out intact";  Please advise on next step in plan of care

## 2019-07-22 NOTE — Telephone Encounter (Signed)
Called and spoke with patient-patient informed of MD recommendations; patient has chosen to be scheduled for a f/u appt on 07/26/2019 at 11:20 am; Patient advised to call back to the office at [phone] should questions/concerns arise; Patient verbalized understanding of information/instructions;

## 2019-07-22 NOTE — Telephone Encounter (Signed)
I am not sure what to make of the pill in the stool, other than to ask whether or not she tried a suppository, and possibly that is what she was seeing.  Otherwise, given ongoing symptoms despite appropriate interventions, would be reasonable to proceed with colonoscopy as we previously discussed.  Otherwise, can set up follow-up appointment with me or one of the APPs depending on availability.

## 2019-07-26 ENCOUNTER — Ambulatory Visit (INDEPENDENT_AMBULATORY_CARE_PROVIDER_SITE_OTHER): Payer: No Typology Code available for payment source | Admitting: Gastroenterology

## 2019-07-26 ENCOUNTER — Encounter: Payer: Self-pay | Admitting: Gastroenterology

## 2019-07-26 ENCOUNTER — Other Ambulatory Visit (INDEPENDENT_AMBULATORY_CARE_PROVIDER_SITE_OTHER): Payer: No Typology Code available for payment source

## 2019-07-26 ENCOUNTER — Other Ambulatory Visit: Payer: Self-pay

## 2019-07-26 VITALS — BP 106/60 | HR 56 | Temp 98.8°F | Ht 60.0 in | Wt 109.4 lb

## 2019-07-26 DIAGNOSIS — R1030 Lower abdominal pain, unspecified: Secondary | ICD-10-CM

## 2019-07-26 DIAGNOSIS — Z1159 Encounter for screening for other viral diseases: Secondary | ICD-10-CM

## 2019-07-26 DIAGNOSIS — K5900 Constipation, unspecified: Secondary | ICD-10-CM

## 2019-07-26 LAB — CBC WITH DIFFERENTIAL/PLATELET
Basophils Absolute: 0.1 10*3/uL (ref 0.0–0.1)
Basophils Relative: 1.2 % (ref 0.0–3.0)
Eosinophils Absolute: 0.1 10*3/uL (ref 0.0–0.7)
Eosinophils Relative: 1.8 % (ref 0.0–5.0)
HCT: 38.9 % (ref 36.0–46.0)
Hemoglobin: 12.6 g/dL (ref 12.0–15.0)
Lymphocytes Relative: 37.7 % (ref 12.0–46.0)
Lymphs Abs: 2 10*3/uL (ref 0.7–4.0)
MCHC: 32.5 g/dL (ref 30.0–36.0)
MCV: 86.1 fl (ref 78.0–100.0)
Monocytes Absolute: 0.4 10*3/uL (ref 0.1–1.0)
Monocytes Relative: 7.6 % (ref 3.0–12.0)
Neutro Abs: 2.8 10*3/uL (ref 1.4–7.7)
Neutrophils Relative %: 51.7 % (ref 43.0–77.0)
Platelets: 278 10*3/uL (ref 150.0–400.0)
RBC: 4.52 Mil/uL (ref 3.87–5.11)
RDW: 12.8 % (ref 11.5–15.5)
WBC: 5.4 10*3/uL (ref 4.0–10.5)

## 2019-07-26 LAB — BASIC METABOLIC PANEL
BUN: 14 mg/dL (ref 6–23)
CO2: 27 mEq/L (ref 19–32)
Calcium: 9.3 mg/dL (ref 8.4–10.5)
Chloride: 104 mEq/L (ref 96–112)
Creatinine, Ser: 0.69 mg/dL (ref 0.40–1.20)
GFR: 104.4 mL/min (ref >60.00)
Glucose, Bld: 90 mg/dL (ref 70–99)
Potassium: 3.9 mEq/L (ref 3.5–5.1)
Sodium: 137 mEq/L (ref 135–145)

## 2019-07-26 LAB — MAGNESIUM: Magnesium: 1.9 mg/dL (ref 1.5–2.5)

## 2019-07-26 LAB — TSH: TSH: 2.2 u[IU]/mL (ref 0.35–4.50)

## 2019-07-26 LAB — PHOSPHORUS: Phosphorus: 3.5 mg/dL (ref 2.3–4.6)

## 2019-07-26 MED ORDER — CLENPIQ 10-3.5-12 MG-GM -GM/160ML PO SOLN: 1.0000 | Freq: Once | ORAL | 0 refills | Status: AC

## 2019-07-26 NOTE — Progress Notes (Signed)
P  Chief Complaint:    Constipation  GI History: 23 year old female with a history of anxiety, depression, follows in the GI clinic for constipation.  Initially seen in 06/2019 with 3 months of hard, pellet-like stools, straining to have BM.  No improvement with a trial of MiraLAX, Dulcolax Gummies (liquid stools and upset stomach).  Was seen by provider at student health at [facility].  X-ray without acute obstruction and some stool burden.  Was started on Linzess trial x 2 weeks, without appreciable response.  Was trialed on bowel purge with Suprep but developed bloating so stopped halfway through (did have 3 stools though).  Tried fleets enemas, with Colace and MiraLAX, with some improvement.  Drinks >80 oz water/day.  Maintains active lifestyle.  Otherwise, no preceding changes in diet, medications, activity, etc. No hematochezia, melena.  Does have a prior history of episodic constipation in the past, typically treated with increased water and occasional MiraLAX with resolution.  Last episode was a few years prior.  Baseline had otherwise been 1 BM qod.    No prior EGD or colonoscopy.  HPI:    Patient is a 23 y.o. female presenting to the Gastroenterology Clinic for follow-up.  Initially seen by me last month with constipation as outlined above.  Have tried multiple modalities, to include purge prep with Suprep, fleets enemas, Colace, MiraLAX, ongoing hydration.  She states she has had some improvement but continues to require MiraLAX tid and Colace.  Still feeling of incomplete evacuation, straining to have BM.  No hematochezia or melena.  Does describe fatigue.  Intermittent lower abdominal discomfort.  Presents with her mother.  Review of systems:     No chest pain, no SOB, no fevers, no urinary sx   Past Medical History:  Diagnosis Date  . Allergy   . Anxiety   . Asthma    As a child uses as needed albuterol when flares exercise and respiratory infections.  . Depression    meds  since 9th grade  dr Lucianne Muss  . Scoliosis   . Seasonal allergies     Patient's surgical history, family medical history, social history, medications and allergies were all reviewed in Epic    Current Outpatient Medications  Medication Sig Dispense Refill  . albuterol (PROVENTIL HFA) 108 (90 BASE) MCG/ACT inhaler Inhale 2 puffs into the lungs daily as needed for wheezing or shortness of breath.    . docusate sodium (COLACE) 100 MG capsule Take 1 capsule (100 mg total) by mouth 2 (two) times daily. 60 capsule 3  . Norethindrone Acetate-Ethinyl Estrad-FE (LOESTRIN 24 FE) 1-20 MG-MCG(24) tablet Take 1 tablet by mouth daily.    . polyethylene glycol (MIRALAX / GLYCOLAX) 17 g packet Take 17 g by mouth 3 (three) times daily.    . Venlafaxine HCl 225 MG TB24   0   No current facility-administered medications for this visit.     Physical Exam:     BP 106/60   Pulse (!) 56   Temp 98.8 F (37.1 C)   Ht 5' (1.524 m)   Wt 109 lb 6 oz (49.6 kg)   BMI 21.36 kg/m   GENERAL:  Pleasant female in NAD PSYCH: : Cooperative, normal affect EENT:  conjunctiva pink, mucous membranes moist, neck supple without masses SKIN:  turgor, no lesions seen Musculoskeletal:  Normal muscle tone, normal strength NEURO: Alert and oriented x 3, no focal neurologic deficits   IMPRESSION and PLAN:    1) Constipation 2) Lower abdominal  discomfort Ongoing constipation despite multiple laxative/stool softener modalities with suboptimal response.  Discussed further diagnostic options to include colonoscopy, Sitz marker study, and briefly ARM.  Evaluate further as below:  -Colonoscopy to rule out mucosal/luminal pathology -Check TSH and electrolyte panel -If colonoscopy unrevealing, but demonstrates good bowel prep, can consider retrialing Linzess vs Amitiza or Motegrity -Depending on results of colonoscopy and response to therapy, can also consider Sitz marker study +/-ARM as indicated -Continue adequate hydration  -Continue MiraLAX for now -Plan for 2-day prep with Dulcolax 10 mg bid x2 days prior to prep  The indications, risks, and benefits of colonoscopy were explained to the patient and her mother in detail. Risks include but are not limited to bleeding, perforation, adverse reaction to medications, and cardiopulmonary compromise. Sequelae include but are not limited to the possibility of surgery, hospitalization, and mortality. The patient verbalized understanding and wished to proceed. All questions answered, referred to the scheduler and bowel prep ordered. Further recommendations pending results of the exam.       Dominic Pea Cirigliano ,DO, FACG 07/26/2019, 11:30 AM

## 2019-07-26 NOTE — Patient Instructions (Signed)
You have been scheduled for a colonoscopy. Please follow written instructions given to you at your visit today.  Please pick up your prep supplies at the pharmacy within the next 1-3 days. If you use inhalers (even only as needed), please bring them with you on the day of your procedure. Your physician has requested that you go to www.startemmi.com and enter the access code given to you at your visit today. This web site gives a general overview about your procedure. However, you should still follow specific instructions given to you by our office regarding your preparation for the procedure.  Your provider has requested that you go to the basement level for lab work at [facility] in [place] [address]. Press "B" on the elevator. The lab is located at the first door on the left as you exit the elevator.  Normal BMI (Body Mass Index- based on height and weight) is between 19 and 25. Your BMI today is Body mass index is 21.36 kg/m. Marland Kitchen Please consider follow up  regarding your BMI with your Primary Care Provider.  It was a pleasure to see you today!  Vito Cirigliano, D.O.

## 2019-07-29 ENCOUNTER — Encounter: Payer: Self-pay | Admitting: Gastroenterology

## 2019-08-01 LAB — SARS CORONAVIRUS 2 (TAT 6-24 HRS): SARS Coronavirus 2: NEGATIVE

## 2019-08-02 ENCOUNTER — Encounter: Payer: Self-pay | Admitting: Gastroenterology

## 2019-08-02 ENCOUNTER — Other Ambulatory Visit: Payer: Self-pay

## 2019-08-02 ENCOUNTER — Ambulatory Visit (AMBULATORY_SURGERY_CENTER): Payer: No Typology Code available for payment source | Admitting: Gastroenterology

## 2019-08-02 VITALS — BP 125/80 | HR 60 | Temp 99.0°F | Resp 14 | Ht 60.0 in | Wt 109.0 lb

## 2019-08-02 DIAGNOSIS — K5900 Constipation, unspecified: Secondary | ICD-10-CM

## 2019-08-02 MED ORDER — SODIUM CHLORIDE 0.9 % IV SOLN: 500.0000 mL | Freq: Once | INTRAVENOUS | Status: DC

## 2019-08-02 NOTE — Patient Instructions (Signed)
YOU MAY RESUME YOUR PREVIOUS HIGH FIBER DIET AND MEDICATION SCHEDULE.  CONTINUE MIRALAX TWICE DAILY FOR NOW.  ADD FIBER SUPPLEMENT IF NECESSARY  THANK YOU FOR ALLOWING [place] TO CARE FOR YOU TODAY!!!  YOU HAD AN ENDOSCOPIC PROCEDURE TODAY AT [facility]:   Refer to the procedure report that was given to you for any specific questions about what was found during the examination.  If the procedure report does not answer your questions, please call your gastroenterologist to clarify.  If you requested that your care partner not be given the details of your procedure findings, then the procedure report has been included in a sealed envelope for you to review at your convenience later.  YOU SHOULD EXPECT: Some feelings of bloating in the abdomen. Passage of more gas than usual.  Walking can help get rid of the air that was put into your GI tract during the procedure and reduce the bloating. If you had a lower endoscopy (such as a colonoscopy or flexible sigmoidoscopy) you may notice spotting of blood in your stool or on the toilet paper. If you underwent a bowel prep for your procedure, you may not have a normal bowel movement for a few days.  Please Note:  You might notice some irritation and congestion in your nose or some drainage.  This is from the oxygen used during your procedure.  There is no need for concern and it should clear up in a day or so.  SYMPTOMS TO REPORT IMMEDIATELY:   Following lower endoscopy (colonoscopy or flexible sigmoidoscopy):  Excessive amounts of blood in the stool  Significant tenderness or worsening of abdominal pains  Swelling of the abdomen that is new, acute  Fever of 100F or higher  For urgent or emergent issues, a gastroenterologist can be reached at any hour by calling [phone].   DIET:  We do recommend a small meal at first, but then you may proceed to your regular diet.  Drink plenty of fluids but you should avoid alcoholic beverages for 24  hours.  ACTIVITY:  You should plan to take it easy for the rest of today and you should NOT DRIVE or use heavy machinery until tomorrow (because of the sedation medicines used during the test).    FOLLOW UP: Our staff will call the number listed on your records 48-72 hours following your procedure to check on you and address any questions or concerns that you may have regarding the information given to you following your procedure. If we do not reach you, we will leave a message.  We will attempt to reach you two times.  During this call, we will ask if you have developed any symptoms of COVID 19. If you develop any symptoms (ie: fever, flu-like symptoms, shortness of breath, cough etc.) before then, please call [phone].  If you test positive for Covid 19 in the 2 weeks post procedure, please call and report this information to [place].    If any biopsies were taken you will be contacted by phone or by letter within the next 1-3 weeks.  Please call us at [phone] if you have not heard about the biopsies in 3 weeks.    SIGNATURES/CONFIDENTIALITY: You and/or your care partner have signed paperwork which will be entered into your electronic medical record.  These signatures attest to the fact that that the information above on your After Visit Summary has been reviewed and is understood.  Full responsibility of the confidentiality of this  discharge information lies with you and/or your care-partner. 

## 2019-08-02 NOTE — Progress Notes (Signed)
Pt tolerated well. VSS. Arousable and to recovery. 

## 2019-08-02 NOTE — Progress Notes (Signed)
VS-Rebekah Tate Temperature- Rebekah Tate   

## 2019-08-02 NOTE — Op Note (Signed)
[facility] Patient Name: Rebekah CaroliKathryn Pizana Procedure Date: 08/02/2019 1:07 PM MRN: [id number] Endoscopist: Doristine LocksVito Tashawnda Bleiler , MD Age: [age] Referring MD:  Date of Birth: Nov 18, 1995 Gender: Female Account #: [id number] Procedure:                Colonoscopy Indications:              Constipation Medicines:                Monitored Anesthesia Care Procedure:                Pre-Anesthesia Assessment:                           - Prior to the procedure, a History and Physical                            was performed, and patient medications and                            allergies were reviewed. The patient's tolerance of                            previous anesthesia was also reviewed. The risks                            and benefits of the procedure and the sedation                            options and risks were discussed with the patient.                            All questions were answered, and informed consent                            was obtained. Prior Anticoagulants: The patient has                            taken no previous anticoagulant or antiplatelet                            agents. ASA Grade Assessment: II - A patient with                            mild systemic disease. After reviewing the risks                            and benefits, the patient was deemed in                            satisfactory condition to undergo the procedure.                           After obtaining informed consent, the colonoscope  was passed under direct vision. Throughout the                            procedure, the patient's blood pressure, pulse, and                            oxygen saturations were monitored continuously. The                            Colonoscope was introduced through the anus and                            advanced to the the terminal ileum. The colonoscopy                            was performed without difficulty. The patient                             tolerated the procedure well. The quality of the                            bowel preparation was excellent. The terminal                            ileum, ileocecal valve, appendiceal orifice, and                            rectum were photographed. Scope In: 1:32:30 PM Scope Out: 1:45:19 PM Scope Withdrawal Time: 0 hours 7 minutes 8 seconds  Total Procedure Duration: 0 hours 12 minutes 49 seconds  Findings:                 The perianal and digital rectal examinations were                            normal.                           The entire colon appeared normal. There were no                            areas of mucosal erythema, edema, erosions, or                            ulceration. No polyps or masses. No strictures.                           The sigmoid colon was moderately tortuous.                           The retroflexed view of the distal rectum and anal                            verge was normal and showed no anal or rectal  abnormalities.                           The terminal ileum appeared normal. Complications:            No immediate complications. Estimated Blood Loss:     Estimated blood loss: none. Impression:               - The entire examined colon is normal.                           - Tortuous sigmoid colon.                           - The distal rectum and anal verge are normal on                            retroflexion view.                           - The examined portion of the ileum was normal.                           - No specimens collected. Recommendation:           - Patient has a contact number available for                            emergencies. The signs and symptoms of potential                            delayed complications were discussed with the                            patient. Return to normal activities tomorrow.                            Written discharge instructions were  provided to the                            patient.                           - Resume previous diet.                           - Continue present medications.                           - Repeat colonoscopy at age 23 for screening                            purposes.                           - Return to GI clinic at appointment to be  scheduled.                           - Resume Miralax twice daily for now and will                            titrate to lowest effective dose.                           - Resume high fiber diet and add fiber supplement                            as needed to maintain soft stools without straining                            to have bowel movement.                           - If symptoms persist, will plan for Sitz Marker                            study +/- Anorectal Manometry. Doristine Locks, MD 08/02/2019 1:52:10 PM

## 2019-08-04 ENCOUNTER — Telehealth: Payer: Self-pay

## 2019-08-04 NOTE — Telephone Encounter (Signed)
Second post procedure follow up call, no answer 

## 2019-08-04 NOTE — Telephone Encounter (Signed)
First attempt follow up call to pt lm on vm 

## 2019-09-21 ENCOUNTER — Ambulatory Visit (INDEPENDENT_AMBULATORY_CARE_PROVIDER_SITE_OTHER): Payer: No Typology Code available for payment source | Admitting: Gastroenterology

## 2019-09-21 ENCOUNTER — Encounter: Payer: Self-pay | Admitting: Gastroenterology

## 2019-09-21 VITALS — BP 90/66 | HR 76 | Temp 97.4°F | Ht 60.0 in | Wt 105.0 lb

## 2019-09-21 DIAGNOSIS — K5900 Constipation, unspecified: Secondary | ICD-10-CM | POA: Diagnosis not present

## 2019-09-21 DIAGNOSIS — R1030 Lower abdominal pain, unspecified: Secondary | ICD-10-CM

## 2019-09-21 NOTE — Progress Notes (Signed)
P  Chief Complaint:    Constipation  GI History: 23 year old female with a history of anxiety, depression, follows in the GI clinic for constipation.  Initially seen in 06/2019 with 3 months of hard, pellet-like stools, straining to have BM.  No improvement with a trial of MiraLAX, Dulcolax Gummies (liquid stools and upset stomach).  Was seen by provider at student health at [facility]. X-ray without acute obstruction and some stool burden. Was started on Linzess trial x 2 weeks, without appreciable response.  Was trialed on bowel purge with Suprep but developed bloating so stopped halfway through (did have 3 stools though).  Tried fleets enemas, with Colace and MiraLAX, with some improvement.  Drinks >80 oz water/day.  Maintains active lifestyle.  Otherwise, no preceding changes in diet, medications, activity, etc. No hematochezia, melena.  Does have a prior history of episodic constipation in the past, typically treated with increased water and occasional MiraLAX with resolution.  Last episode was a few years prior.  Baseline had otherwise been 1 BM qod.    Normal TSH, BMP.  No appreciable improvement with purge prep with Suprep, fleets enemas, Colace, MiraLAX, ongoing hydration, so proceed with colonoscopy.  Colonoscopy normal in 07/2019.  Started on MiraLAX BID and continued Colace with consideration for retrial of Linzess vs Amitiza or Motegrity, sitz marker study +/- ARm.  Endoscopic History: -Colonoscopy (07/2019, Dr. Barron Alvine): Excellent prep, normal colon.  Moderately tortuous sigmoid.  Normal terminal ileum  HPI:     Patient is a 23 y.o. female presenting to the Gastroenterology Clinic for follow-up.  She reports that she is still having constipation despite MiraLAX BID and Colace BID.   Has had an extensive evaluation as above, which has been essentially unrevealing to date.  Continues to have straining to have BM, feeling of incomplete evacuation.  Today, she describes more of a  sensation of "pushing against a brick wall".  Still no hematochezia.  Intermittent lower abdominal discomfort.  She presents with her mother to clinic.    Review of systems:     No chest pain, no SOB, no fevers, no urinary sx   Past Medical History:  Diagnosis Date  . Allergy   . Anxiety   . Asthma    As a child uses as needed albuterol when flares exercise and respiratory infections.  . Depression    meds since 9th grade  dr Lucianne Muss  . Scoliosis   . Seasonal allergies     Patient's surgical history, family medical history, social history, medications and allergies were all reviewed in Epic    Current Outpatient Medications  Medication Sig Dispense Refill  . albuterol (PROVENTIL HFA) 108 (90 BASE) MCG/ACT inhaler Inhale 2 puffs into the lungs daily as needed for wheezing or shortness of breath.    . docusate sodium (COLACE) 100 MG capsule Take 1 capsule (100 mg total) by mouth 2 (two) times daily. 60 capsule 3  . Norethindrone Acetate-Ethinyl Estrad-FE (LOESTRIN 24 FE) 1-20 MG-MCG(24) tablet Take 1 tablet by mouth daily.    . polyethylene glycol (MIRALAX / GLYCOLAX) 17 g packet Take 17 g by mouth 3 (three) times daily.    . Venlafaxine HCl 225 MG TB24   0   No current facility-administered medications for this visit.    Physical Exam:     BP 90/66   Pulse 76   Temp (!) 97.4 F (36.3 C)   Ht 5' (1.524 m)   Wt 105 lb (47.6 kg)   BMI  20.51 kg/m   GENERAL:  Pleasant female in NAD PSYCH: : Cooperative, normal affect EENT:  conjunctiva pink, mucous membranes moist, neck supple without masses ABDOMEN:  Nondistended, soft, nontender. No obvious masses, no hepatomegaly,   SKIN:  turgor, no lesions seen Musculoskeletal:  Normal muscle tone, normal strength NEURO: Alert and oriented x 3, no focal neurologic deficits   IMPRESSION and PLAN:    1) Constipation: 2) Lower abdominal pain -Persisting constipation despite adequate trial of multiple medications, to include  laxatives, stool softeners, prior trial of Linzess.  Clinical description today seems more consistent with pelvic floor dyssynergia -Okay to resume current laxatives -Resume adequate hydration as currently do not -Referral for anorectal manometry -If ARM unrevealing, may consider Sitz marker study and/or trial of Amitiza or Motegrity -Discussed ARM in detail with the patient and her mother today.  All questions answered      I spent over 30 minutes of time, including in depth chart review, independent review of results as outlined above, communicating results with the patient and her mother directly, face-to-face time with the patient, coordinating care, and ordering studies and medications as appropriate. Greater than 50% of the time was spent counseling and coordinating care.    Gillespie ,DO, FACG 09/21/2019, 1:50 PM

## 2019-09-21 NOTE — Patient Instructions (Addendum)
If you are age 24 or older, your body mass index should be between 23-30. Your Body mass index is 20.51 kg/m. If this is out of the aforementioned range listed, please consider follow up with your Primary Care Provider.  If you are age 40 or younger, your body mass index should be between 19-25. Your Body mass index is 20.51 kg/m. If this is out of the aformentioned range listed, please consider follow up with your Primary Care Provider.   Due to recent COVID-19 restrictions implemented by our local and state authorities and in an effort to keep both patients and staff as safe as possible, our hospital system now requires COVID-19 testing prior to any scheduled hospital procedure. Please go to our [facility] location drive thru testing site ([address] [place], [place] [address]) on Saturday 09/24/19 at  12 pm. There will be multiple testing areas, the first checkpoint being for pre-procedure/surgery testing. Get into the right (yellow) lane that leads to the PAT testing team. You will not be billed at the time of testing but may receive a bill later depending on your insurance. The approximate cost of the test is $100. You must agree to quarantine from the time of your testing until the procedure date on 09/28/19 . This should include staying at home with ONLY the people you live with. Avoid take-out, grocery store shopping or leaving the house for any non-emergent reason. Failure to have your COVID-19 test done on the date and time you have been scheduled will result in cancellation of procedure. Please call our office at [phone] if you have any questions.    You have been scheduled to have an anorectal manometry at [facility] on 09/28/19 at 12:30 pm. Please arrive 30 minutes prior to your appointment time for registration (1st floor of the hospital-admissions).  Please make certain to use 1 Fleets enema 2 hours prior to coming for your appointment. You can purchase Fleets  enemas from the laxative section at your drug store. You should not eat anything during the two hours prior to the procedure. You may take regular medications with small sips of water at least 2 hours prior to the study.  Anorectal manometry is a test performed to evaluate patients with constipation or fecal incontinence. This test measures the pressures of the anal sphincter muscles, the sensation in the rectum, and the neural reflexes that are needed for normal bowel movements.  THE PROCEDURE The test takes approximately 30 minutes to 1 hour. You will be asked to change into a hospital gown. A technician or nurse will explain the procedure to you, take a brief health history, and answer any questions you may have. The patient then lies on his or her left side. A small, flexible tube, about the size of a thermometer, with a balloon at the end is inserted into the rectum. The catheter is connected to a machine that measures the pressure. During the test, the small balloon attached to the catheter may be inflated in the rectum to assess the normal reflex pathways. The nurse or technician may also ask the person to squeeze, relax, and push at various times. The anal sphincter muscle pressures are measured during each of these maneuvers. To squeeze, the patient tightens the sphincter muscles as if trying to prevent anything from coming out. To push or bear down, the patient strains down as if trying to have a bowel movement.

## 2019-09-22 ENCOUNTER — Telehealth: Payer: Self-pay | Admitting: Gastroenterology

## 2019-09-22 NOTE — Telephone Encounter (Signed)
Pt is scheduled for an ano rectal manometry 09/28/19 and stated that she will be out of town this weekend and will not be able to have the Covid test on Saturday.  She would like to reschedule ano rectal mano to any day before 10/02/19.

## 2019-09-22 NOTE — Telephone Encounter (Addendum)
Called and spoke with patient-patient has been rescheduled for her anorectal manometry on 09/30/2019 at 12:30 pm; patient has also been rescheduled for her [facility] screening on 09/27/2019 at 8:15 am; revised instructions were offered to the patient- patient reports she will not need revised instructions sent to her -she will call the office if she needs anything or has any questions; Patient advised to call back to the office at [phone] should questions/concerns arise;  Patient verbalized understanding of information/instructions;

## 2019-09-24 ENCOUNTER — Other Ambulatory Visit (HOSPITAL_COMMUNITY): Payer: Self-pay

## 2019-09-27 ENCOUNTER — Other Ambulatory Visit (HOSPITAL_COMMUNITY)
Admission: RE | Admit: 2019-09-27 | Discharge: 2019-09-27 | Disposition: A | Discharge status: Home / Self Care | Payer: No Typology Code available for payment source | Source: Ambulatory Visit | Attending: Gastroenterology | Admitting: Gastroenterology

## 2019-09-27 DIAGNOSIS — Z20822 Contact with and (suspected) exposure to covid-19: Secondary | ICD-10-CM | POA: Insufficient documentation

## 2019-09-27 DIAGNOSIS — Z01812 Encounter for preprocedural laboratory examination: Secondary | ICD-10-CM | POA: Diagnosis present

## 2019-09-29 LAB — NOVEL CORONAVIRUS, NAA (HOSP ORDER, SEND-OUT TO REF LAB; TAT 18-24 HRS): SARS-CoV-2, NAA: NOT DETECTED

## 2019-09-30 ENCOUNTER — Encounter (HOSPITAL_COMMUNITY): Admission: RE | Payer: Self-pay | Source: Home / Self Care

## 2019-09-30 ENCOUNTER — Ambulatory Visit (HOSPITAL_COMMUNITY)
Admission: RE | Admit: 2019-09-30 | Payer: No Typology Code available for payment source | Source: Home / Self Care | Admitting: Gastroenterology

## 2019-09-30 ENCOUNTER — Telehealth: Payer: Self-pay | Admitting: Gastroenterology

## 2019-09-30 DIAGNOSIS — K5900 Constipation, unspecified: Secondary | ICD-10-CM

## 2019-09-30 DIAGNOSIS — R1030 Lower abdominal pain, unspecified: Secondary | ICD-10-CM

## 2019-09-30 SURGERY — MANOMETRY, ANORECTAL

## 2019-09-30 NOTE — Telephone Encounter (Signed)
Patient had appointment at hospital today for a ano rectal manometry- she found out this morning that she was in contact with someone Monday that tested positive for covid. She called the hospital and cancelled the procedure for today but needs to get it rescheduled.

## 2019-09-30 NOTE — Telephone Encounter (Signed)
Called and spoke with patient-patient reports she would like to reschedule her anorectal manometry; patient already has a COVID screening for returning to college on 10/06/2019 at 2:00pm at the [facility]; patient has been rescheduled for her procedure on 10/10/2019 at 8:30 am; REVISED instructions have been sent to patient (via MyChart) per her request; instructions also mailed to patient's home;  amb ref has been placed in Epic;

## 2019-10-03 NOTE — Telephone Encounter (Signed)
Pt wants to know if she can get her manometry scheduled sooner and her covid screening scheduled this week?

## 2019-10-03 NOTE — Telephone Encounter (Signed)
Spoke to patient this morning. She has agreed to proceed with anorectal manometry on 10/10/19 at 8:30am. She knows to have her Covid screening done this week.

## 2019-10-03 NOTE — Telephone Encounter (Signed)
Please assist with this patient  

## 2019-10-06 ENCOUNTER — Other Ambulatory Visit: Payer: No Typology Code available for payment source

## 2019-10-06 ENCOUNTER — Other Ambulatory Visit (HOSPITAL_COMMUNITY)
Admission: RE | Admit: 2019-10-06 | Discharge: 2019-10-06 | Disposition: A | Discharge status: Home / Self Care | Payer: No Typology Code available for payment source | Source: Ambulatory Visit | Attending: Gastroenterology | Admitting: Gastroenterology

## 2019-10-06 DIAGNOSIS — Z01812 Encounter for preprocedural laboratory examination: Secondary | ICD-10-CM | POA: Insufficient documentation

## 2019-10-06 DIAGNOSIS — Z20822 Contact with and (suspected) exposure to covid-19: Secondary | ICD-10-CM | POA: Diagnosis not present

## 2019-10-07 LAB — NOVEL CORONAVIRUS, NAA (HOSP ORDER, SEND-OUT TO REF LAB; TAT 18-24 HRS): SARS-CoV-2, NAA: NOT DETECTED

## 2019-10-07 NOTE — Progress Notes (Signed)
Attempted to contact patient for preop call. Unable to leave message.  Voicemail full

## 2019-10-10 ENCOUNTER — Encounter (HOSPITAL_COMMUNITY)
Admission: RE | Disposition: A | Discharge status: Home / Self Care | Payer: Self-pay | Source: Home / Self Care | Attending: Gastroenterology

## 2019-10-10 ENCOUNTER — Ambulatory Visit (HOSPITAL_COMMUNITY)
Admission: RE | Admit: 2019-10-10 | Discharge: 2019-10-10 | Disposition: A | Discharge status: Home / Self Care | Payer: No Typology Code available for payment source | Attending: Gastroenterology | Admitting: Gastroenterology

## 2019-10-10 ENCOUNTER — Encounter (HOSPITAL_COMMUNITY): Payer: Self-pay | Admitting: Gastroenterology

## 2019-10-10 DIAGNOSIS — K5902 Outlet dysfunction constipation: Secondary | ICD-10-CM | POA: Diagnosis not present

## 2019-10-10 DIAGNOSIS — K5900 Constipation, unspecified: Secondary | ICD-10-CM | POA: Diagnosis present

## 2019-10-10 HISTORY — PX: ANAL RECTAL MANOMETRY: SHX6358

## 2019-10-10 SURGERY — MANOMETRY, ANORECTAL

## 2019-10-10 NOTE — Progress Notes (Signed)
Anorectal manometry performed per protocol.  Patient tolerated well.  Balloon expulsion test performed and patient was unable to expel 50 cc balloon at 3 minutes.  Water was removed from balloon and balloon was removed.  Patient tolerated well.  Report sent to Dr Marsa Aris.

## 2019-10-11 ENCOUNTER — Encounter: Payer: Self-pay | Admitting: *Deleted

## 2019-10-19 ENCOUNTER — Telehealth: Payer: Self-pay | Admitting: Gastroenterology

## 2019-10-19 DIAGNOSIS — M6289 Other specified disorders of muscle: Secondary | ICD-10-CM

## 2019-10-19 NOTE — Telephone Encounter (Signed)
Yes, there are no results for review just yet.  As soon as I have the results, we will review those with her.  Thank you.

## 2019-10-19 NOTE — Telephone Encounter (Signed)
Please forward to correct nurse.

## 2019-10-19 NOTE — Telephone Encounter (Signed)
Called and spoke with patient-patient informed that results may take at least 2 weeks -however, patient will be informed of results as soon as they are available;

## 2019-10-19 NOTE — Telephone Encounter (Signed)
Patient is calling to follow up on procedure D.O.S 10/10/19 for Ano Rectal Manometry.

## 2019-10-26 DIAGNOSIS — K5902 Outlet dysfunction constipation: Secondary | ICD-10-CM

## 2019-10-31 NOTE — Telephone Encounter (Signed)
Pt stated that she is reading results from ano rectal manometry on MyChart and needs clarification.

## 2019-11-01 ENCOUNTER — Other Ambulatory Visit: Payer: Self-pay | Admitting: Gastroenterology

## 2019-11-01 NOTE — Telephone Encounter (Signed)
Rebekah Cleverly, DO  10/31/2019 11:58 AM EST    Anorectal Manometry results consistent with Pelvic Floor Dyssynergia. Please refer to pelvic floor PT for retraining and biofeedback.   amb ref to PELVIC PT placed in Epic;

## 2020-07-12 ENCOUNTER — Ambulatory Visit
Admission: RE | Admit: 2020-07-12 | Discharge: 2020-07-12 | Disposition: A | Discharge status: Home / Self Care | Payer: No Typology Code available for payment source | Source: Ambulatory Visit | Attending: Sports Medicine | Admitting: Sports Medicine

## 2020-07-12 ENCOUNTER — Other Ambulatory Visit: Payer: Self-pay | Admitting: Sports Medicine

## 2020-07-12 ENCOUNTER — Other Ambulatory Visit (HOSPITAL_COMMUNITY): Payer: Self-pay

## 2020-07-12 ENCOUNTER — Ambulatory Visit (INDEPENDENT_AMBULATORY_CARE_PROVIDER_SITE_OTHER): Payer: No Typology Code available for payment source | Admitting: Sports Medicine

## 2020-07-12 ENCOUNTER — Other Ambulatory Visit: Payer: Self-pay

## 2020-07-12 VITALS — BP 120/62 | Ht 60.0 in | Wt 102.0 lb

## 2020-07-12 DIAGNOSIS — M4125 Other idiopathic scoliosis, thoracolumbar region: Secondary | ICD-10-CM

## 2020-07-13 NOTE — Progress Notes (Signed)
   Subjective:    Patient ID: Rebekah Tate, female    DOB: 1996/04/21, 24 y.o.   MRN: [id number]  HPI chief complaint: Back pain and neck pain  24 year old female comes in today complaining of longstanding back and neck pain.  She has a history of scoliosis.  Tried extensive physical therapy with Ellamae Sia with no real improvement.  She describes diffuse pain throughout her entire spine.  She has difficulty sleeping at night due to her discomfort.  She tried several different medications including melatonin and trazodone but nothing has been helpful.  Last lumbar x-ray was in December 2019.  Last thoracic x-ray was in November 2017.  No prior back surgeries.  No recent trauma.  Past medical history reviewed Medications reviewed Allergies reviewed    Review of Systems    As above Objective:   Physical Exam  Well-developed, well-nourished.  No acute distress  Examination of her lumbar and thoracic spine show an obvious scoliotic curve.  Right-sided rib hump with forward flexion.  Diffuse tenderness to palpation along the lumbosacral spine but nothing focal.  No appreciable spasm.  No gross focal neurological deficit of either lower extremity.  X-rays of her lumbar spine in 2019 were fairly unremarkable X-rays of her thoracic spine in 2017 showed a moderate reverse S-shaped thoracolumbar scoliosis.  Upper level of curvature measured 30 degrees at T4-T5 and lower level of curvature measured 22 degrees at T11.        Assessment & Plan:  Chronic back pain secondary to scoliosis  Patient has had exhaustive physical therapy yet continues to have significant pain.  I would like to start with getting an updated scoliosis film.  Phone follow-up with those results when available.  She may benefit from referral to a scoliosis clinic in [facility] (she is a Consulting civil engineer at [employer]).

## 2020-07-16 ENCOUNTER — Telehealth: Payer: Self-pay | Admitting: Sports Medicine

## 2020-07-16 NOTE — Telephone Encounter (Signed)
  I spoke with Rebekah Tate on the phone today after reviewing her scoliosis films from late last week.  There has been a mild progression of her scoliosis.  Upper curve measures 37 degrees while the lower curve measures 30 degrees.  I recommended consultation with a scoliosis specialist given her persistent symptoms and decreasing quality of life despite long-term physical therapy.  We will reach out to Dr. Adah Salvage first at [facility].  If Dr. Adah Salvage only sees pediatric patients, then we may consider referral to the [facility] clinic in [place].

## 2020-08-14 ENCOUNTER — Ambulatory Visit: Payer: No Typology Code available for payment source | Admitting: Sports Medicine

## 2020-08-20 ENCOUNTER — Telehealth: Payer: Self-pay | Admitting: Gastroenterology

## 2020-08-20 NOTE — Telephone Encounter (Signed)
Patient calling to get a referral resent for Pelvic floor PT for she will now have the time to proceed with it

## 2020-08-21 ENCOUNTER — Other Ambulatory Visit: Payer: Self-pay | Admitting: Gastroenterology

## 2020-08-21 DIAGNOSIS — K5900 Constipation, unspecified: Secondary | ICD-10-CM

## 2020-08-21 DIAGNOSIS — M6289 Other specified disorders of muscle: Secondary | ICD-10-CM

## 2020-08-21 NOTE — Telephone Encounter (Signed)
A new referral has been placed for pelvic floor PT for dyssynergia. A message has been left on patients voice mail that [facility] will be contacting her in the next week or so.

## 2020-08-24 NOTE — Progress Notes (Signed)
Pt called stating Dr. Allyne Gee office in [place] doesn't take her insurance. She will also be moving back to [facility] mid December and would like a referral in that area.  Per Dr. Darrick Penna and Dr. Margaretha Sheffield - will refer to Dr. Yevette Edwards at [facility]. Pt is in agreement with the plan.  Appt: 09/10/20 @ 9:30 am.

## 2020-09-06 NOTE — Telephone Encounter (Signed)
Patient calling to notify that she has not heard from the referring office

## 2020-09-06 NOTE — Telephone Encounter (Signed)
Spoke with the referral department a [facility]. They have contacted the patient 08/27/20 for an appointment on 10/09/20. The will call her again to confirm this appointment.

## 2020-09-10 ENCOUNTER — Other Ambulatory Visit (HOSPITAL_COMMUNITY): Payer: Self-pay | Admitting: Orthopedic Surgery

## 2020-09-25 ENCOUNTER — Other Ambulatory Visit (HOSPITAL_COMMUNITY): Payer: Self-pay | Admitting: Radiology

## 2020-10-09 ENCOUNTER — Other Ambulatory Visit: Payer: Self-pay

## 2020-10-09 ENCOUNTER — Ambulatory Visit: Payer: No Typology Code available for payment source | Attending: Gastroenterology | Admitting: Physical Therapy

## 2020-10-09 DIAGNOSIS — R252 Cramp and spasm: Secondary | ICD-10-CM | POA: Diagnosis not present

## 2020-10-09 DIAGNOSIS — R279 Unspecified lack of coordination: Secondary | ICD-10-CM | POA: Insufficient documentation

## 2020-10-09 NOTE — Patient Instructions (Addendum)
Toileting Techniques for Bowel Movements (Defecation) Using your belly (abdomen) and pelvic floor muscles to have a bowel movement is usually instinctive.  Sometimes people can have problems with these muscles and have to relearn proper defecation (emptying) techniques.  If you have weakness in your muscles, organs that are falling out, decreased sensation in your pelvis, or ignore your urge to go, you may find yourself straining to have a bowel movement.  You are straining if you are: . holding your breath or taking in a huge gulp of air and holding it  . keeping your lips and jaw tensed and closed tightly . turning red in the face because of excessive pushing or forcing . developing or worsening your  hemorrhoids . getting faint while pushing . not emptying completely and have to defecate many times a day  If you are straining, you are actually making it harder for yourself to have a bowel movement.  Many people find they are pulling up with the pelvic floor muscles and closing off instead of opening the anus. Due to lack pelvic floor relaxation and coordination the abdominal muscles, one has to work harder to push the feces out.  Many people have never been taught how to defecate efficiently and effectively.  Notice what happens to your body when you are having a bowel movement.  While you are sitting on the toilet pay attention to the following areas: . Jaw and mouth position . Angle of your hips   . Whether your feet touch the ground or not . Arm placement  . Spine position . Waist . Belly tension . Anus (opening of the anal canal)  An Evacuation/Defecation Plan   Here are the 4 basic points:  1. Lean forward enough for your elbows to rest on your knees 2. Support your feet on the floor or use a low stool if your feet don't touch the floor  3. Push out your belly as if you have swallowed a beach ball-you should feel a widening of your waist 4. Open and relax your pelvic floor muscles,  rather than tightening around the anus      The following conditions my require modifications to your toileting posture:  . If you have had surgery in the past that limits your back, hip, pelvic, knee or ankle flexibility . Constipation   Your healthcare practitioner may make the following additional suggestions and adjustments:  1) Sit on the toilet  a) Make sure your feet are supported. b) Notice your hip angle and spine position-most people find it effective to lean forward or raise their knees, which can help the muscles around the anus to relax  c) When you lean forward, place your forearms on your thighs for support  2) Relax suggestions a) Breath deeply in through your nose and out slowly through your mouth as if you are smelling the flowers and blowing out the candles. b) To become aware of how to relax your muscles, contracting and releasing muscles can be helpful.  Pull your pelvic floor muscles in tightly by using the image of holding back gas, or closing around the anus (visualize making a circle smaller) and lifting the anus up and in.  Then release the muscles and your anus should drop down and feel open. Repeat 5 times ending with the feeling of relaxation. c) Keep your pelvic floor muscles relaxed; let your belly bulge out. d) The digestive tract starts at the mouth and ends at the anal opening, so be   sure to relax both ends of the tube.  Place your tongue on the roof of your mouth with your teeth separated.  This helps relax your mouth and will help to relax the anus at the same time.  3) Empty (defecation) a) Keep your pelvic floor and sphincter relaxed, then bulge your anal muscles.  Make the anal opening wide.  b) Stick your belly out as if you have swallowed a beach ball. c) Make your belly wall hard using your belly muscles while continuing to breathe. Doing this makes it easier to open your anus. d) Breath out and give a grunt (or try using other sounds such as  ahhhh, shhhhh, ohhhh or grrrrrrr).  4) Finish a) As you finish your bowel movement, pull the pelvic floor muscles up and in.  This will leave your anus in the proper place rather than remaining pushed out and down. If you leave your anus pushed out and down, it will start to feel as though that is normal and give you incorrect signals about needing to have a bowel movement.   [facility] [address] [place], [place] [address]  

## 2020-10-10 NOTE — Therapy (Signed)
[facility] [address], [address] [place], [place], [address] Phone: [phone]   Fax:  [phone]  Physical Therapy Evaluation  Patient Details  Name: Rebekah Tate MRN: [id number] Date of Birth: [birth year] Referring Provider (PT): Shellia Cleverly, [place]   Encounter Date: 10/09/2020   PT End of Session - 10/09/20 1546    Visit Number 1    Date for PT Re-Evaluation 01/01/21    Authorization Type [facility]    PT Start Time 1547    PT Stop Time 1638    PT Time Calculation (min) 51 min    Activity Tolerance Patient tolerated treatment well    Behavior During Therapy [facility] for tasks assessed/performed           Past Medical History:  Diagnosis Date  . Allergy   . Anxiety   . Asthma    As a child uses as needed albuterol when flares exercise and respiratory infections.  . Depression    meds since 9th grade  dr Lucianne Muss  . Scoliosis   . Seasonal allergies     Past Surgical History:  Procedure Laterality Date  . ANAL RECTAL MANOMETRY N/A 10/10/2019   Procedure: ANO RECTAL MANOMETRY;  Surgeon: Shellia Cleverly, DO;  Location: [facility];  Service: Gastroenterology;  Laterality: N/A;  . WISDOM TOOTH EXTRACTION      There were no vitals filed for this visit.    Subjective Assessment - 10/09/20 1555    Subjective Over the last few months sometimes don't poop for a week or more. Pt states she has always had constipation and it started getting a lot worse in 2020 with max 5 weeks of no BM.    Pertinent History scoliosis, anxiety/depression, hx of suicide ideation, exercises 2+hours/day, chronic constipation    Patient Stated Goals have regular BMs    Currently in Pain? Yes    Pain Score 6    this is a normal day   Pain Location Generalized    Pain Descriptors / Indicators Aching    Pain Type Chronic pain    Pain Radiating Towards shoulder, back, hip - muscle tension    Pain Onset More than a month ago     Pain Frequency Constant    Aggravating Factors  nothing    Pain Relieving Factors exercises feels good    Multiple Pain Sites No              [facility] PT Assessment - 10/10/20 0001      Assessment   Medical Diagnosis M62.89 (ICD-10-CM) - Pelvic floor dysfunction; K59.00 (ICD-10-CM) - Constipation, unspecified constipation type    Referring Provider (PT) Cirigliano, Vito V, DO    Prior Therapy No      Precautions   Precautions None      Balance Screen   Has the patient fallen in the past 6 months No      Home Environment   Living Environment Private residence    Living Arrangements Parent      Prior Function   Level of Independence Independent    Vocation Self employed   nanny 3x/week   Leisure works out at least 2 hours/day   20-25 minutes     Posture/Postural Control   Posture/Postural Control --   scoliosis     ROM / Strength   AROM / PROM / Strength Strength;PROM;AROM      AROM   Overall AROM Comments lumbar tight in fwd flex  PROM   Overall PROM Comments hip ER 80% bilaterally      Strength   Overall Strength Comments 4+/5 bilat LE      Flexibility   Soft Tissue Assessment /Muscle Length yes    Hamstrings Rt 80%; Lt 90%    Quadriceps tight bilat    Quadratus Lumborum tight Rt>Lt      Palpation   Palpation comment abdominal wall tight; fascia along transverse colon restricted      Special Tests   Other special tests ASLR negative      Ambulation/Gait   Gait Pattern Within Functional Limits                      Objective measurements completed on examination: See above findings.     Pelvic Floor Special Questions - 10/10/20 0001    Prior Pelvic/Prostate Exam Yes    Currently Sexually Active No    Urinary Leakage Yes    How often very rarely but had the most the other day,    Activities that cause leaking Running;Sneezing;Coughing   ran 1.5 miles   Fecal incontinence --   only a small amount, diameter is small, sometimes rabbit  pellets   Fluid intake 100 oz    Pelvic Floor Internal Exam pt identity confirmed and consent to perform internal soft tissue given    Exam Type Rectal    Palpation TTP tight sphincters and puborectalis, unable to assess deeper due to TTP    Strength weak squeeze, no lift    Strength # of reps --   6 quick   Strength # of seconds 4    Tone high            [facility] Adult PT Treatment/Exercise - 10/10/20 0001      Self-Care   Self-Care Other Self-Care Comments    Other Self-Care Comments  toileting techniques perfomred and reviewed                  PT Education - 10/10/20 1547    Education Details toileting techniques reviewed    Person(s) Educated Patient    Methods Explanation;Demonstration;Handout;Verbal cues    Comprehension Verbalized understanding;Returned demonstration            PT Short Term Goals - 10/10/20 1617      PT SHORT TERM GOAL #1   Title Pt will be I with initial HEP for core stability.     Time 4    Period Weeks    Status New    Target Date 11/06/20      PT SHORT TERM GOAL #2   Title Pt will report 30% easier time having a BM    Time 4    Period Weeks    Status New      PT SHORT TERM GOAL #3   Title Pt will be consistent with food diary to identify foods that may exacerbate constipation    Time 4    Period Weeks    Status New             PT Long Term Goals - 10/10/20 1613      PT LONG TERM GOAL #1   Title Pt will be able to have BM every 3-4 days due to improved muscle coordination    Time 12    Period Weeks    Status New    Target Date 01/01/21      PT LONG TERM GOAL #2  Title Pt will report pain reduced at least 50% due to improved body mechanics with lifting    Time 12    Period Weeks    Status New    Target Date 01/01/21      PT LONG TERM GOAL #3   Title Pt will be ind wtih advanced HEP    Time 12    Period Weeks    Status New    Target Date 01/01/21      PT LONG TERM GOAL #4   Title Pt will increase LE  strength and stability to 5/5 for improved safety with gym routine.    Time 12    Period Weeks    Status New    Target Date 01/01/21      PT LONG TERM GOAL #5   Title .Marland KitchenMarland Kitchen                  Plan - 10/10/20 1548    Clinical Impression Statement Pt presents to clinic due to primary c/o constipation that has been worsening over the past 2 years with max time of 5 weeks and no BM. Pt has scoliosis and muscle spasm throughout paraspinlas, upper traps, glutes and abdominal wall Rt>Lt and fascial restrictions along transverse colon.  Rt hamstring tight compared to Lt.  Pt has very tight and TTP anal sphincters and puborectalis muscles .Pt is able to produce pressure in pelvic floor and bulge but simultaneously tightens sphincters and PR muscles . PT unable to palpate all muscles due to guarding and severity of TTP.  Pt able to contract 6 quick flicks at 2/5MMT and can hold for 4 seconds before starts to lose strength.  Pt will benefit from skilled PT to address soft tissue impairments and muscle coordination for improved ability to have normal BM as well as addressing functional core movements for improved pain.    Personal Factors and Comorbidities Comorbidity 3+    Comorbidities scoliosis, anxiety/depression, hx of suicide ideation, exercises 2+hours/day, chronic constipation    Examination-Activity Limitations Toileting;Continence    Examination-Participation Restrictions Community Activity    Stability/Clinical Decision Making Evolving/Moderate complexity    Clinical Decision Making Moderate    Rehab Potential Good    PT Frequency 2x / week    PT Duration 12 weeks    PT Treatment/Interventions ADLs/Self Care Home Management;Biofeedback;Cryotherapy;Electrical Stimulation;Moist Heat;Neuromuscular re-education;Therapeutic exercise;Therapeutic activities;Gait training;Patient/family education;Manual techniques;Dry needling;Passive range of motion;Taping    PT Next Visit Plan DN lumbar with  estim worked before for pain; abdominal fascial release    PT Home Exercise Plan gave toileting techniques    Consulted and Agree with Plan of Care Patient           Patient will benefit from skilled therapeutic intervention in order to improve the following deficits and impairments:  Decreased coordination,Decreased range of motion,Postural dysfunction,Decreased strength,Pain  Visit Diagnosis: Cramp and spasm  Unspecified lack of coordination     Problem List Patient Active Problem List   Diagnosis Date Noted  . Constipation due to outlet dysfunction   . Low back pain 09/23/2018  . Insomnia 10/07/2016  . Left ankle sprain 09/04/2016  . Suicidal ideation 08/20/2016  . MDD (major depressive disorder), recurrent severe, without psychosis (HCC) 08/20/2016  . Lower extremity pain 07/24/2016  . Idiopathic scoliosis 07/24/2016  . ADD (attention deficit disorder) without hyperactivity 05/15/2016    Junious Silk, PT 10/10/2020, 4:24 PM  [facility] [address], [address] [place], [place],  [address] Phone: [phone]   Fax:  [phone]  Name: SIMRANJIT THAYER MRN: [id number] Date of Birth: [birth year]

## 2020-10-15 ENCOUNTER — Other Ambulatory Visit: Payer: Self-pay

## 2020-10-15 ENCOUNTER — Ambulatory Visit: Payer: No Typology Code available for payment source | Admitting: Physical Therapy

## 2020-10-15 ENCOUNTER — Encounter: Payer: Self-pay | Admitting: Physical Therapy

## 2020-10-15 DIAGNOSIS — R252 Cramp and spasm: Secondary | ICD-10-CM

## 2020-10-15 DIAGNOSIS — R279 Unspecified lack of coordination: Secondary | ICD-10-CM

## 2020-10-15 NOTE — Patient Instructions (Signed)
Access Code: [email or web address] URL: [email or web address] Date: 10/15/2020 Prepared by: Dwana Curd  Exercises Sternocleidomastoid Stretch - 1 x daily - 7 x weekly - 1 sets - 3 reps - 30 sec hold Sidelying Thoracic and Shoulder Rotation - 1 x daily - 7 x weekly - 2 sets - 10 reps - 5 sec hold Open Book Chest Stretch on Towel Roll - 1 x daily - 7 x weekly - 3 sets - 10 reps

## 2020-10-15 NOTE — Therapy (Signed)
[facility] [address], [address] [place], [place], [address] Phone: [phone]   Fax:  [phone]  Physical Therapy Treatment  Patient Details  Name: Rebekah Tate MRN: [id number] Date of Birth: [birth year] Referring Provider (PT): Shellia Cleverly, [place]   Encounter Date: 10/15/2020   PT End of Session - 10/15/20 1654    Visit Number 2    Date for PT Re-Evaluation 01/01/21    Authorization Type [facility]    PT Start Time 1536    PT Stop Time 1628    PT Time Calculation (min) 52 min    Activity Tolerance Patient tolerated treatment well    Behavior During Therapy [facility] for tasks assessed/performed           Past Medical History:  Diagnosis Date  . Allergy   . Anxiety   . Asthma    As a child uses as needed albuterol when flares exercise and respiratory infections.  . Depression    meds since 9th grade  dr Lucianne Muss  . Scoliosis   . Seasonal allergies     Past Surgical History:  Procedure Laterality Date  . ANAL RECTAL MANOMETRY N/A 10/10/2019   Procedure: ANO RECTAL MANOMETRY;  Surgeon: Shellia Cleverly, DO;  Location: [facility];  Service: Gastroenterology;  Laterality: N/A;  . WISDOM TOOTH EXTRACTION      There were no vitals filed for this visit.   Subjective Assessment - 10/15/20 1542    Subjective My hip is hurting today. Maybe a little better since I went to the gym, but 8/10.    Pertinent History scoliosis, anxiety/depression, hx of suicide ideation, exercises 2+hours/day, chronic constipation    Currently in Pain? Yes    Pain Score 8     Pain Location Hip    Pain Orientation Left;Lateral    Pain Descriptors / Indicators Sore    Pain Type Chronic pain    Pain Onset More than a month ago    Pain Frequency Intermittent    Aggravating Factors  not sure    Pain Relieving Factors exercise unless I do too much    Multiple Pain Sites No                             [facility]  Adult PT Treatment/Exercise - 10/15/20 0001      Lumbar Exercises: Stretches   Other Lumbar Stretch Exercise goal post and thoracic rotation stretch - 2 x 30 sec      Manual Therapy   Manual Therapy Soft tissue mobilization;Myofascial release    Soft tissue mobilization scm, cervical paraspinals    Myofascial Release along colon, rectum, stomach, diaphragm, suboccipitals and Rt SCM      Neck Exercises: Stretches   Neck Stretch 2 reps;20 seconds    Neck Stretch Limitations SCM                  PT Education - 10/15/20 1629    Education Details Access Code: [email or web address]    Person(s) Educated Patient    Methods Explanation;Demonstration;Tactile cues;Verbal cues;Handout    Comprehension Verbalized understanding;Returned demonstration            PT Short Term Goals - 10/10/20 1617      PT SHORT TERM GOAL #1   Title Pt will be I with initial HEP for core stability.     Time 4    Period Weeks  Status New    Target Date 11/06/20      PT SHORT TERM GOAL #2   Title Pt will report 30% easier time having a BM    Time 4    Period Weeks    Status New      PT SHORT TERM GOAL #3   Title Pt will be consistent with food diary to identify foods that may exacerbate constipation    Time 4    Period Weeks    Status New             PT Long Term Goals - 10/10/20 1613      PT LONG TERM GOAL #1   Title Pt will be able to have BM every 3-4 days due to improved muscle coordination    Time 12    Period Weeks    Status New    Target Date 01/01/21      PT LONG TERM GOAL #2   Title Pt will report pain reduced at least 50% due to improved body mechanics with lifting    Time 12    Period Weeks    Status New    Target Date 01/01/21      PT LONG TERM GOAL #3   Title Pt will be ind wtih advanced HEP    Time 12    Period Weeks    Status New    Target Date 01/01/21      PT LONG TERM GOAL #4   Title Pt will increase LE strength and stability to 5/5 for improved safety with  gym routine.    Time 12    Period Weeks    Status New    Target Date 01/01/21      PT LONG TERM GOAL #5   Title .Marland KitchenMarland Kitchen                 Plan - 10/15/20 1629    Clinical Impression Statement Pt at initial f/u from eval.  Pt states she had a good BM last week on Friday with mirilax helping to achieve this but wasn't comfortable prior to BM.  Pt responded well to myofascial release throughout abdomen with good release around tranverse flexure and stomach region.  pt has tension in superior mesentary that did not fully release in today's treatment.  Today's session also addressed posture deficits begininning with cervical alignment.  Restrictions noted especially in Rt scm and Lt levators with head slightly Rt sidebending at rest.  trunk was slightly rotated to the right as well and pt was given thoracic rotation stretch to address these impairments.  Pt rec to continue with skilled therapy to continue to work on soft tissue coordination and posture alignment for improved BM.    PT Treatment/Interventions ADLs/Self Care Home Management;Biofeedback;Cryotherapy;Electrical Stimulation;Moist Heat;Neuromuscular re-education;Therapeutic exercise;Therapeutic activities;Gait training;Patient/family education;Manual techniques;Dry needling;Passive range of motion;Taping    PT Next Visit Plan DN lumbar with estim worked before for pain; f/u on abdominal fascial release    PT Home Exercise Plan gave toileting techniques    Consulted and Agree with Plan of Care Patient           Patient will benefit from skilled therapeutic intervention in order to improve the following deficits and impairments:  Decreased coordination,Decreased range of motion,Postural dysfunction,Decreased strength,Pain  Visit Diagnosis: Cramp and spasm  Unspecified lack of coordination     Problem List Patient Active Problem List   Diagnosis Date Noted  . Constipation due to outlet dysfunction   .  Low back pain 09/23/2018   . Insomnia 10/07/2016  . Left ankle sprain 09/04/2016  . Suicidal ideation 08/20/2016  . MDD (major depressive disorder), recurrent severe, without psychosis (HCC) 08/20/2016  . Lower extremity pain 07/24/2016  . Idiopathic scoliosis 07/24/2016  . ADD (attention deficit disorder) without hyperactivity 05/15/2016    Junious Silk, PT 10/15/2020, 4:55 PM  [facility] [address], [address] [place], [place], [address] Phone: [phone]   Fax:  [phone]  Name: KARIGAN CLONINGER MRN: [id number] Date of Birth: Jan 15, 1996

## 2020-10-18 ENCOUNTER — Other Ambulatory Visit: Payer: Self-pay

## 2020-10-18 ENCOUNTER — Ambulatory Visit: Payer: No Typology Code available for payment source | Admitting: Physical Therapy

## 2020-10-18 DIAGNOSIS — R252 Cramp and spasm: Secondary | ICD-10-CM

## 2020-10-18 DIAGNOSIS — R279 Unspecified lack of coordination: Secondary | ICD-10-CM

## 2020-10-18 NOTE — Therapy (Signed)
[facility] [address], [address] [place], [place], [address] Phone: [phone]   Fax:  [phone]  Physical Therapy Treatment  Patient Details  Name: Rebekah Tate MRN: [id number] Date of Birth: 15-Sep-1996 Referring Provider (PT): Shellia Cleverly, [place]   Encounter Date: 10/18/2020   PT End of Session - 10/18/20 1655    Visit Number 3    Date for PT Re-Evaluation 01/01/21    Authorization Type [facility] [employer]    PT Start Time 1449    PT Stop Time 1531    PT Time Calculation (min) 42 min    Activity Tolerance Patient tolerated treatment well    Behavior During Therapy [facility] for tasks assessed/performed           Past Medical History:  Diagnosis Date  . Allergy   . Anxiety   . Asthma    As a child uses as needed albuterol when flares exercise and respiratory infections.  . Depression    meds since 9th grade  dr Lucianne Muss  . Scoliosis   . Seasonal allergies     Past Surgical History:  Procedure Laterality Date  . ANAL RECTAL MANOMETRY N/A 10/10/2019   Procedure: ANO RECTAL MANOMETRY;  Surgeon: Shellia Cleverly, DO;  [facility]: [facility];  Service: Gastroenterology;  Laterality: N/A;  . WISDOM TOOTH EXTRACTION      There were no vitals filed for this visit.   Subjective Assessment - 10/18/20 1703    Subjective Hip was hurting and could only walk the dogs 2 miles today.  Hurts with walking and certain movements    Pertinent History scoliosis, anxiety/depression, hx of suicide ideation, exercises 2+hours/day, chronic constipation    Patient Stated Goals have regular BMs    Currently in Pain? No/denies   not currently, but after walking was in pain                            [facility] Adult PT Treatment/Exercise - 10/18/20 0001      Lumbar Exercises: Stretches   Press Ups 10 reps;5 seconds    Other Lumbar Stretch Exercise child pose and child pose with threading; breathing into hips and back       Manual Therapy   Soft tissue mobilization lumbar paraspinals and glutes Lt, h/s Lt side    Myofascial Release ascending/descending colon            Trigger Point Dry Needling - 10/18/20 0001    Consent Given? Yes    Education Handout Provided Previously provided    Muscles Treated Back/Hip Gluteus medius;Piriformis;Lumbar multifidi    Gluteus Medius Response Twitch response elicited;Palpable increased muscle length    Piriformis Response Twitch response elicited;Palpable increased muscle length    Lumbar multifidi Response Twitch response elicited;Palpable increased muscle length                  PT Short Term Goals - 10/18/20 1700      PT SHORT TERM GOAL #1   Title Pt will be I with initial HEP for core stability.     Status On-going      PT SHORT TERM GOAL #2   Title Pt will report 30% easier time having a BM    Status On-going      PT SHORT TERM GOAL #3   Title Pt will be consistent with food diary to identify foods that may exacerbate constipation  Status On-going             PT Long Term Goals - 10/10/20 1613      PT LONG TERM GOAL #1   Title Pt will be able to have BM every 3-4 days due to improved muscle coordination    Time 12    Period Weeks    Status New    Target Date 01/01/21      PT LONG TERM GOAL #2   Title Pt will report pain reduced at least 50% due to improved body mechanics with lifting    Time 12    Period Weeks    Status New    Target Date 01/01/21      PT LONG TERM GOAL #3   Title Pt will be ind wtih advanced HEP    Time 12    Period Weeks    Status New    Target Date 01/01/21      PT LONG TERM GOAL #4   Title Pt will increase LE strength and stability to 5/5 for improved safety with gym routine.    Time 12    Period Weeks    Status New    Target Date 01/01/21      PT LONG TERM GOAL #5   Title .Marland KitchenMarland Kitchen                 Plan - 10/18/20 1656    Clinical Impression Statement Pt was able to have a BM without  mirilax today.  Pt sill having hip pain.  The STM and dry needling seems to help hip and she has tension throughout the Lt lumbar, gluteals and h/s. Pt hadless pain after treatment today.  Pt had good fascial release in abdomen.  Educated to continue with stretches and breathing and bulging technique and will work on downtraining pelvic floor with biofeedback next as tolerated.    PT Treatment/Interventions ADLs/Self Care Home Management;Biofeedback;Cryotherapy;Electrical Stimulation;Moist Heat;Neuromuscular re-education;Therapeutic exercise;Therapeutic activities;Gait training;Patient/family education;Manual techniques;Dry needling;Passive range of motion;Taping    PT Next Visit Plan biofeedback for pelvic floor downtraining    PT Home Exercise Plan gave toileting techniques    Consulted and Agree with Plan of Care Patient           Patient will benefit from skilled therapeutic intervention in order to improve the following deficits and impairments:  Decreased coordination,Decreased range of motion,Postural dysfunction,Decreased strength,Pain  Visit Diagnosis: Cramp and spasm  Unspecified lack of coordination     Problem List Patient Active Problem List   Diagnosis Date Noted  . Constipation due to outlet dysfunction   . Low back pain 09/23/2018  . Insomnia 10/07/2016  . Left ankle sprain 09/04/2016  . Suicidal ideation 08/20/2016  . MDD (major depressive disorder), recurrent severe, without psychosis (HCC) 08/20/2016  . Lower extremity pain 07/24/2016  . Idiopathic scoliosis 07/24/2016  . ADD (attention deficit disorder) without hyperactivity 05/15/2016    Junious Silk, PT 10/18/2020, 5:04 PM  [facility] [address] [place], [place], [address] Phone: [phone]   Fax:  [phone]  Name: Rebekah Tate MRN: [id number] Date of Birth: [birth year]

## 2020-10-22 ENCOUNTER — Encounter: Payer: No Typology Code available for payment source | Admitting: Physical Therapy

## 2020-10-22 ENCOUNTER — Other Ambulatory Visit (HOSPITAL_COMMUNITY): Payer: Self-pay

## 2020-10-23 ENCOUNTER — Encounter: Payer: No Typology Code available for payment source | Admitting: Internal Medicine

## 2020-11-06 ENCOUNTER — Ambulatory Visit: Payer: No Typology Code available for payment source | Attending: Gastroenterology | Admitting: Physical Therapy

## 2020-11-06 ENCOUNTER — Other Ambulatory Visit: Payer: Self-pay

## 2020-11-06 DIAGNOSIS — R252 Cramp and spasm: Secondary | ICD-10-CM | POA: Insufficient documentation

## 2020-11-06 DIAGNOSIS — R279 Unspecified lack of coordination: Secondary | ICD-10-CM | POA: Insufficient documentation

## 2020-11-06 NOTE — Patient Instructions (Signed)
Access Code: [email or web address] URL: [email or web address] Date: 11/06/2020 Prepared by: Dwana Curd  Exercises Sternocleidomastoid Stretch - 1 x daily - 7 x weekly - 1 sets - 3 reps - 30 sec hold Sidelying Thoracic and Shoulder Rotation - 1 x daily - 7 x weekly - 2 sets - 10 reps - 5 sec hold Open Book Chest Stretch on Towel Roll - 1 x daily - 7 x weekly - 3 sets - 10 reps Quadruped Exhale with Pelvic Floor Contraction and Arm Raise - 1 x daily - 7 x weekly - 3 sets - 10 reps Static Prone on Elbows - 1 x daily - 7 x weekly - 3 sets - 10 reps Child's Pose Stretch - 1 x daily - 7 x weekly - 3 sets - 10 reps Child's Pose with Thread the Needle - 1 x daily - 7 x weekly - 3 sets - 10 reps

## 2020-11-06 NOTE — Therapy (Signed)
[facility] [address] [place], [place], [address] Phone: [phone]   Fax:  [phone]  Physical Therapy Treatment  Patient Details  Name: Rebekah Tate MRN: [id number] Date of Birth: [birth year] Referring Provider (PT): Shellia Cleverly, [place]   Encounter Date: 11/06/2020   PT End of Session - 11/06/20 1143    Visit Number 4    Date for PT Re-Evaluation 01/01/21    Authorization Type [facility] [employer]    PT Start Time 1111   arrived late   PT Stop Time 1141    PT Time Calculation (min) 30 min    Activity Tolerance Patient tolerated treatment well    Behavior During Therapy [facility] for tasks assessed/performed           Past Medical History:  Diagnosis Date  . Allergy   . Anxiety   . Asthma    As a child uses as needed albuterol when flares exercise and respiratory infections.  . Depression    meds since 9th grade  dr Lucianne Muss  . Scoliosis   . Seasonal allergies     Past Surgical History:  Procedure Laterality Date  . ANAL RECTAL MANOMETRY N/A 10/10/2019   Procedure: ANO RECTAL MANOMETRY;  Surgeon: Shellia Cleverly, DO;  Location: [facility];  Service: Gastroenterology;  Laterality: N/A;  . WISDOM TOOTH EXTRACTION      There were no vitals filed for this visit.   Subjective Assessment - 11/06/20 1155    Subjective I feel like the myofascial release helped and have been doing better with BMs    Patient Stated Goals have regular BMs    Currently in Pain? Yes   hip and back but no number given                            [facility] Adult PT Treatment/Exercise - 11/06/20 0001      Neuro Re-ed    Neuro Re-ed Details  biofeedback with stretches and exercises for correct coordination and visual cues      Lumbar Exercises: Stretches   Other Lumbar Stretch Exercise child pose and threading; knees to chest; figure 4 stretches, press ups on forearms - 60 sec each with breathing      Lumbar  Exercises: Quadruped   Single Arm Raise 10 reps;Right;Left    Other Quadruped Lumbar Exercises kegel with contraction                  PT Education - 11/06/20 1142    Education Details Access Code: [email or web address]    Person(s) Educated Patient    Methods Explanation;Demonstration;Tactile cues;Verbal cues;Handout    Comprehension Verbalized understanding;Returned demonstration            PT Short Term Goals - 11/06/20 1149      PT SHORT TERM GOAL #1   Title Pt will be I with initial HEP for core stability.     Baseline core strength added today    Status On-going      PT SHORT TERM GOAL #2   Title Pt will report 30% easier time having a BM    Baseline reports it has been better lately    Status On-going      PT SHORT TERM GOAL #3   Title Pt will be consistent with food diary to identify foods that may exacerbate constipation    Status Deferred  PT Long Term Goals - 10/10/20 1613      PT LONG TERM GOAL #1   Title Pt will be able to have BM every 3-4 days due to improved muscle coordination    Time 12    Period Weeks    Status New    Target Date 01/01/21      PT LONG TERM GOAL #2   Title Pt will report pain reduced at least 50% due to improved body mechanics with lifting    Time 12    Period Weeks    Status New    Target Date 01/01/21      PT LONG TERM GOAL #3   Title Pt will be ind wtih advanced HEP    Time 12    Period Weeks    Status New    Target Date 01/01/21      PT LONG TERM GOAL #4   Title Pt will increase LE strength and stability to 5/5 for improved safety with gym routine.    Time 12    Period Weeks    Status New    Target Date 01/01/21      PT LONG TERM GOAL #5   Title .Marland KitchenMarland Kitchen                 Plan - 11/06/20 1143    Clinical Impression Statement Today's session focused on muscle coordination with contract and relax in various positions using the biofeedback for cues and body awareness.  Pt was able to correctly contract  and relax after exercises and added strengthening today as she demonstrates fatigue after 10 reps.  Pt had shorter session today due to late arrival.    PT Treatment/Interventions ADLs/Self Care Home Management;Biofeedback;Cryotherapy;Electrical Stimulation;Moist Heat;Neuromuscular re-education;Therapeutic exercise;Therapeutic activities;Gait training;Patient/family education;Manual techniques;Dry needling;Passive range of motion;Taping    PT Next Visit Plan dry needling lumbar and glutes; review core and plevic floor strength as needed    PT Home Exercise Plan Access Code: [email or web address]    Consulted and Agree with Plan of Care Patient           Patient will benefit from skilled therapeutic intervention in order to improve the following deficits and impairments:  Decreased coordination,Decreased range of motion,Postural dysfunction,Decreased strength,Pain  Visit Diagnosis: Cramp and spasm  Unspecified lack of coordination     Problem List Patient Active Problem List   Diagnosis Date Noted  . Constipation due to outlet dysfunction   . Low back pain 09/23/2018  . Insomnia 10/07/2016  . Left ankle sprain 09/04/2016  . Suicidal ideation 08/20/2016  . MDD (major depressive disorder), recurrent severe, without psychosis (HCC) 08/20/2016  . Lower extremity pain 07/24/2016  . Idiopathic scoliosis 07/24/2016  . ADD (attention deficit disorder) without hyperactivity 05/15/2016    Junious Silk, PT 11/06/2020, 11:57 AM  [facility] [address] [place], [place], [address] Phone: [phone]   Fax:  [phone]  Name: Rebekah Tate MRN: [id number] Date of Birth: 02-25-1996

## 2020-11-07 ENCOUNTER — Ambulatory Visit: Payer: No Typology Code available for payment source | Admitting: Physical Therapy

## 2020-11-07 ENCOUNTER — Encounter: Payer: Self-pay | Admitting: Physical Therapy

## 2020-11-07 DIAGNOSIS — R252 Cramp and spasm: Secondary | ICD-10-CM

## 2020-11-07 DIAGNOSIS — R279 Unspecified lack of coordination: Secondary | ICD-10-CM

## 2020-11-07 NOTE — Therapy (Signed)
[facility] [address] [place], [place], [address] Phone: [phone]   Fax:  [phone]  Physical Therapy Treatment  Patient Details  Name: Rebekah Tate MRN: [id number] Date of Birth: 08-07-1996 Referring Provider (PT): Shellia Cleverly, [place]   Encounter Date: 11/07/2020   PT End of Session - 11/07/20 1605    Visit Number 5    Date for PT Re-Evaluation 01/01/21    Authorization Type  Focus    PT Start Time 1605    PT Stop Time 1655    PT Time Calculation (min) 50 min    Activity Tolerance Patient tolerated treatment well    Behavior During Therapy [facility] for tasks assessed/performed           Past Medical History:  Diagnosis Date  . Allergy   . Anxiety   . Asthma    As a child uses as needed albuterol when flares exercise and respiratory infections.  . Depression    meds since 9th grade  dr Lucianne Muss  . Scoliosis   . Seasonal allergies     Past Surgical History:  Procedure Laterality Date  . ANAL RECTAL MANOMETRY N/A 10/10/2019   Procedure: ANO RECTAL MANOMETRY;  Surgeon: Shellia Cleverly, DO;  Location: [facility];  Service: Gastroenterology;  Laterality: N/A;  . WISDOM TOOTH EXTRACTION      There were no vitals filed for this visit.   Subjective Assessment - 11/07/20 1617    Subjective It is a dull ache constantly but walking makes it much worse.  My stomach is feeling better.    Pertinent History scoliosis, anxiety/depression, hx of suicide ideation, exercises 2+hours/day, chronic constipation    Patient Stated Goals have regular BMs    Currently in Pain? Yes    Pain Score 4     Pain Location Hip    Pain Orientation Left    Pain Descriptors / Indicators Aching;Dull    Pain Type Chronic pain    Pain Radiating Towards glute down to ischial tub on Lt side and up into the left low back    Pain Onset More than a month ago    Pain Frequency Intermittent    Aggravating Factors  not  sure what caused the flare up but walking makes it much worse    Pain Relieving Factors not sure, resting hasn't made much of a difference    Multiple Pain Sites No                             [facility] Adult PT Treatment/Exercise - 11/07/20 0001      Exercises   Exercises Knee/Hip      Lumbar Exercises: Quadruped   Other Quadruped Lumbar Exercises with foam on back to stab - fire hydrants 10x bilat      Knee/Hip Exercises: Standing   Step Down Right;Left;10 reps;Step Height: 4"   mirror and VC   SLS on foam mat - hip abduction and ext - 10x each side      Modalities   Modalities Electrical Stimulation      Electrical Stimulation   Electrical Stimulation Location Lt TFL and Lt lumbar paraspinals with needles    Electrical Stimulation Action pre-mod    Electrical Stimulation Parameters 70 mA to tolerance    Electrical Stimulation Goals Pain      Manual Therapy   Manual Therapy Taping    Soft tissue mobilization lumbar  paraspinals and glutes Lt, h/s Lt side    Kinesiotex Inhibit Muscle      Kinesiotix   Inhibit Muscle  lumbar paraspinals          estim x 10 min   Trigger Point Dry Needling - 11/07/20 0001    Consent Given? Yes    Education Handout Provided Previously provided    Muscles Treated Back/Hip Tensor fascia lata    Gluteus Medius Response Twitch response elicited;Palpable increased muscle length    Piriformis Response Twitch response elicited;Palpable increased muscle length    Tensor Fascia Lata Response Twitch response elicited;Palpable increased muscle length    Lumbar multifidi Response Twitch response elicited;Palpable increased muscle length                  PT Short Term Goals - 11/06/20 1149      PT SHORT TERM GOAL #1   Title Pt will be I with initial HEP for core stability.     Baseline core strength added today    Status On-going      PT SHORT TERM GOAL #2   Title Pt will report 30% easier time having a BM    Baseline  reports it has been better lately    Status On-going      PT SHORT TERM GOAL #3   Title Pt will be consistent with food diary to identify foods that may exacerbate constipation    Status Deferred             PT Long Term Goals - 10/10/20 1613      PT LONG TERM GOAL #1   Title Pt will be able to have BM every 3-4 days due to improved muscle coordination    Time 12    Period Weeks    Status New    Target Date 01/01/21      PT LONG TERM GOAL #2   Title Pt will report pain reduced at least 50% due to improved body mechanics with lifting    Time 12    Period Weeks    Status New    Target Date 01/01/21      PT LONG TERM GOAL #3   Title Pt will be ind wtih advanced HEP    Time 12    Period Weeks    Status New    Target Date 01/01/21      PT LONG TERM GOAL #4   Title Pt will increase LE strength and stability to 5/5 for improved safety with gym routine.    Time 12    Period Weeks    Status New    Target Date 01/01/21      PT LONG TERM GOAL #5   Title .Marland KitchenMarland Kitchen                 Plan - 11/07/20 1704    Clinical Impression Statement Today's session focused more on glute strength and soft tissue release in lumbar and gluteals  Pt needed cues to keep pelvis neutral with exercises, but was able to do so when using mirror for cues.  Pt demonstrates single leg ex's more challengeing on Lt side due to gluteal weakness.  Muscle release and increased soft tissue length with STM today.  Pt was educated on more exercises to add to HEP for gluteal strengthening.  She will benefit from skilled PT to continue to work on improved soft tissue and improved body mechanics for reduced spasms.    PT Treatment/Interventions  ADLs/Self Care Home Management;Biofeedback;Cryotherapy;Electrical Stimulation;Moist Heat;Neuromuscular re-education;Therapeutic exercise;Therapeutic activities;Gait training;Patient/family education;Manual techniques;Dry needling;Passive range of motion;Taping    PT Next Visit  Plan f/u on gluteal strength; pelvic floor internal STM if needed or biofeedback    PT Home Exercise Plan Access Code: [email or web address]    Consulted and Agree with Plan of Care Patient           Patient will benefit from skilled therapeutic intervention in order to improve the following deficits and impairments:  Decreased coordination,Decreased range of motion,Postural dysfunction,Decreased strength,Pain  Visit Diagnosis: Cramp and spasm  Unspecified lack of coordination     Problem List Patient Active Problem List   Diagnosis Date Noted  . Constipation due to outlet dysfunction   . Low back pain 09/23/2018  . Insomnia 10/07/2016  . Left ankle sprain 09/04/2016  . Suicidal ideation 08/20/2016  . MDD (major depressive disorder), recurrent severe, without psychosis (HCC) 08/20/2016  . Lower extremity pain 07/24/2016  . Idiopathic scoliosis 07/24/2016  . ADD (attention deficit disorder) without hyperactivity 05/15/2016    Junious Silk, PT 11/07/2020, 5:44 PM  [facility] [address] [place], [place], [address] Phone: [phone]   Fax:  [phone]  Name: NYKIA TURKO MRN: [id number] Date of Birth: 1995/11/19

## 2020-11-14 ENCOUNTER — Other Ambulatory Visit: Payer: Self-pay

## 2020-11-14 ENCOUNTER — Ambulatory Visit: Payer: No Typology Code available for payment source | Admitting: Physical Therapy

## 2020-11-14 ENCOUNTER — Encounter: Payer: Self-pay | Admitting: Physical Therapy

## 2020-11-14 DIAGNOSIS — R279 Unspecified lack of coordination: Secondary | ICD-10-CM

## 2020-11-14 DIAGNOSIS — R252 Cramp and spasm: Secondary | ICD-10-CM | POA: Diagnosis not present

## 2020-11-14 NOTE — Therapy (Signed)
[facility] [address], [place], [place], [address] Phone: [phone]   Fax:  [phone]  Physical Therapy Treatment  Patient Details  Name: Rebekah Tate MRN: [id number] Date of Birth: 03/19/1996 Referring Provider (PT): Lavena Bullion, [place]   Encounter Date: 11/14/2020   PT End of Session - 11/14/20 1630    Visit Number 6    Date for PT Re-Evaluation 01/01/21    Authorization Type  Focus    PT Start Time 1622    PT Stop Time 1701    PT Time Calculation (min) 39 min    Activity Tolerance Patient tolerated treatment well    Behavior During Therapy [facility] for tasks assessed/performed           Past Medical History:  Diagnosis Date  . Allergy   . Anxiety   . Asthma    As a child uses as needed albuterol when flares exercise and respiratory infections.  . Depression    meds since 9th grade  dr Dwyane Dee  . Scoliosis   . Seasonal allergies     Past Surgical History:  Procedure Laterality Date  . ANAL RECTAL MANOMETRY N/A 10/10/2019   Procedure: ANO RECTAL MANOMETRY;  Surgeon: Lavena Bullion, DO;  [facility]: [facility];  Service: Gastroenterology;  Laterality: N/A;  . WISDOM TOOTH EXTRACTION      There were no vitals filed for this visit.   Subjective Assessment - 11/14/20 1625    Subjective Pt states she had a couple bad days but then had a BM and feels better.  The back feels better but now her Rt hip  is sore today. 50% better BM and almost back to normal.    Pertinent History scoliosis, anxiety/depression, hx of suicide ideation, exercises 2+hours/day, chronic constipation    Patient Stated Goals have regular BMs    Currently in Pain? Yes    Pain Score 6     Pain Location Hip    Pain Orientation Right    Pain Descriptors / Indicators Aching                             [facility] Adult PT Treatment/Exercise - 11/14/20 0001      Neuro Re-ed    Neuro Re-ed Details   cues for posture alignment and lift through arch, press leg down      Lumbar Exercises: Stretches   Active Hamstring Stretch Right;Left;2 reps;30 seconds    Active Hamstring Stretch Limitations ab and adductor stretches all with strap      Knee/Hip Exercises: Standing   Step Down Right;Left;10 reps;Step Height: 6"   mirror and VC   SLS on foam mat - hip abduction and ext - 10x each side    Other Standing Knee Exercises standing on foam fwd reach and mini squat    Other Standing Knee Exercises staggared stance hamstring/glute stretch and lift      Knee/Hip Exercises: Supine   Bridges Strengthening;Left;Right;10 reps      Manual Therapy   Soft tissue mobilization Rt gltues; addaday                    PT Short Term Goals - 11/14/20 1714      PT SHORT TERM GOAL #1   Title Pt will be I with initial HEP for core stability.     Status Achieved      PT SHORT TERM  GOAL #2   Title Pt will report 30% easier time having a BM    Baseline 50% better    Status Achieved      PT SHORT TERM GOAL #3   Title Pt will be consistent with food diary to identify foods that may exacerbate constipation    Baseline eating the same things no patterns noticed    Status Deferred             PT Long Term Goals - 11/14/20 1715      PT LONG TERM GOAL #1   Title Pt will be able to have BM every 3-4 days due to improved muscle coordination    Baseline had 4 days between BMs last week    Status Partially Met      PT LONG TERM GOAL #2   Title Pt will report pain reduced at least 50% due to improved body mechanics with lifting    Status On-going      PT LONG TERM GOAL #3   Title Pt will be ind wtih advanced HEP    Status On-going      PT LONG TERM GOAL #4   Title Pt will increase LE strength and stability to 5/5 for improved safety with gym routine.    Status On-going                 Plan - 11/14/20 1708    Clinical Impression Statement Today's session focused on glute  strength.  Pt was fatigued at the end of the session but responded well to [facility] for muscle release on Rt LE where she had more tension.  Pt needed cues to keep spine netural and activate the arch for improved LE alignment. Pt has almost met goals for improved BM reports 50% less difficutly and reports only one day with abdominal discomfort that resolved after having a BM.  Pt will benefit from skilled PT to address functional movement and continue to work on muscle coordination for BMs for full return to normal baseline.    PT Treatment/Interventions ADLs/Self Care Home Management;Biofeedback;Cryotherapy;Electrical Stimulation;Moist Heat;Neuromuscular re-education;Therapeutic exercise;Therapeutic activities;Gait training;Patient/family education;Manual techniques;Dry needling;Passive range of motion;Taping    PT Next Visit Plan progress gluteal strength; pelvic floor internal STM if needed or biofeedback    PT Home Exercise Plan Access Code: [email or web address]    Consulted and Agree with Plan of Care Patient           Patient will benefit from skilled therapeutic intervention in order to improve the following deficits and impairments:  Decreased coordination,Decreased range of motion,Postural dysfunction,Decreased strength,Pain  Visit Diagnosis: Cramp and spasm  Unspecified lack of coordination     Problem List Patient Active Problem List   Diagnosis Date Noted  . Constipation due to outlet dysfunction   . Low back pain 09/23/2018  . Insomnia 10/07/2016  . Left ankle sprain 09/04/2016  . Suicidal ideation 08/20/2016  . MDD (major depressive disorder), recurrent severe, without psychosis ([place]) 08/20/2016  . Lower extremity pain 07/24/2016  . Idiopathic scoliosis 07/24/2016  . ADD (attention deficit disorder) without hyperactivity 05/15/2016    Jule Ser, PT 11/14/2020, 5:21 PM  [facility] [address] [place],  [place], [address] Phone: [phone]   Fax:  [phone]  Name: Rebekah Tate MRN: [id number] Date of Birth: [birth year]

## 2020-11-16 ENCOUNTER — Ambulatory Visit: Payer: No Typology Code available for payment source | Admitting: Physical Therapy

## 2020-11-16 ENCOUNTER — Other Ambulatory Visit: Payer: Self-pay

## 2020-11-16 DIAGNOSIS — R252 Cramp and spasm: Secondary | ICD-10-CM | POA: Diagnosis not present

## 2020-11-16 DIAGNOSIS — R279 Unspecified lack of coordination: Secondary | ICD-10-CM

## 2020-11-16 NOTE — Therapy (Signed)
[facility] [address], [place], [place], [address] Phone: [phone]   Fax:  [phone]  Physical Therapy Treatment  Patient Details  Name: Rebekah Tate MRN: [id number] Date of Birth: 11/04/1995 Referring Provider (PT): Lavena Bullion, [place]   Encounter Date: 11/16/2020   PT End of Session - 11/16/20 1057    Visit Number 7    Date for PT Re-Evaluation 01/01/21    Authorization Type [facility] [employer]    PT Start Time 0254    PT Stop Time 1057    PT Time Calculation (min) 42 min    Activity Tolerance Patient tolerated treatment well    Behavior During Therapy [facility] for tasks assessed/performed           Past Medical History:  Diagnosis Date  . Allergy   . Anxiety   . Asthma    As a child uses as needed albuterol when flares exercise and respiratory infections.  . Depression    meds since 9th grade  dr Dwyane Dee  . Scoliosis   . Seasonal allergies     Past Surgical History:  Procedure Laterality Date  . ANAL RECTAL MANOMETRY N/A 10/10/2019   Procedure: ANO RECTAL MANOMETRY;  Surgeon: Lavena Bullion, DO;  [facility]: [facility];  Service: Gastroenterology;  Laterality: N/A;  . WISDOM TOOTH EXTRACTION      There were no vitals filed for this visit.   Subjective Assessment - 11/16/20 1205    Subjective BM feels normal lately.  My Lt side of the back is where I am feeling some discomfort today    Currently in Pain? No/denies                             [facility] Adult PT Treatment/Exercise - 11/16/20 0001      Neuro Re-ed    Neuro Re-ed Details  cues for posture alignment and activate glutes      Knee/Hip Exercises: Standing   Functional Squat Limitations squat with blue loop and gluteal activated with isometric hip ER      Knee/Hip Exercises: Sidelying   Other Sidelying Knee/Hip Exercises side planks with hip abduction; front plank with hip ext - 10x each            Trigger  Point Dry Needling - 11/16/20 0001    Consent Given? Yes    Education Handout Provided Previously provided    Muscles Treated Back/Hip Thoracic multifidi    Lumbar multifidi Response Palpable increased muscle length;Twitch response elicited    Thoracic multifidi response Twitch response elicited;Palpable increased muscle length                  PT Short Term Goals - 11/14/20 1714      PT SHORT TERM GOAL #1   Title Pt will be I with initial HEP for core stability.     Status Achieved      PT SHORT TERM GOAL #2   Title Pt will report 30% easier time having a BM    Baseline 50% better    Status Achieved      PT SHORT TERM GOAL #3   Title Pt will be consistent with food diary to identify foods that may exacerbate constipation    Baseline eating the same things no patterns noticed    Status Deferred             PT Long Term  Goals - 11/14/20 1715      PT LONG TERM GOAL #1   Title Pt will be able to have BM every 3-4 days due to improved muscle coordination    Baseline had 4 days between BMs last week    Status Partially Met      PT LONG TERM GOAL #2   Title Pt will report pain reduced at least 50% due to improved body mechanics with lifting    Status On-going      PT LONG TERM GOAL #3   Title Pt will be ind wtih advanced HEP    Status On-going      PT LONG TERM GOAL #4   Title Pt will increase LE strength and stability to 5/5 for improved safety with gym routine.    Status On-going                 Plan - 11/16/20 1058    Clinical Impression Statement Today's session focused on posture and alignment.  Pt is having more normal BMs lately. Pt doing better with pelvic stability during the glute exercises.  muscle imbalances are present in low back and thoracic spine but she also has mild scoliosis.  Pt needed some cues to activate gluteals for improved squat with less anterior pelvic rotation and did well with VCs for that.  Pt will benefit from skilled PT to  ensure she is continueing to feel improved bowel symptsoms and HEP is advanced to maximum functional activities.    PT Treatment/Interventions ADLs/Self Care Home Management;Biofeedback;Cryotherapy;Electrical Stimulation;Moist Heat;Neuromuscular re-education;Therapeutic exercise;Therapeutic activities;Gait training;Patient/family education;Manual techniques;Dry needling;Passive range of motion;Taping    PT Next Visit Plan glute and core and posutre strength, f/u on BM control    PT Home Exercise Plan Access Code: [email or web address]    Consulted and Agree with Plan of Care Patient           Patient will benefit from skilled therapeutic intervention in order to improve the following deficits and impairments:  Decreased coordination,Decreased range of motion,Postural dysfunction,Decreased strength,Pain  Visit Diagnosis: Cramp and spasm  Unspecified lack of coordination     Problem List Patient Active Problem List   Diagnosis Date Noted  . Constipation due to outlet dysfunction   . Low back pain 09/23/2018  . Insomnia 10/07/2016  . Left ankle sprain 09/04/2016  . Suicidal ideation 08/20/2016  . MDD (major depressive disorder), recurrent severe, without psychosis ([place]) 08/20/2016  . Lower extremity pain 07/24/2016  . Idiopathic scoliosis 07/24/2016  . ADD (attention deficit disorder) without hyperactivity 05/15/2016    Jule Ser, PT 11/16/2020, 12:06 PM  [facility] [address] [place], [place], [address] Phone: [phone]   Fax:  [phone]  Name: Rebekah Tate MRN: [id number] Date of Birth: [birth year]

## 2020-11-20 ENCOUNTER — Ambulatory Visit: Payer: No Typology Code available for payment source | Attending: Gastroenterology | Admitting: Physical Therapy

## 2020-11-20 ENCOUNTER — Other Ambulatory Visit: Payer: Self-pay

## 2020-11-20 DIAGNOSIS — R279 Unspecified lack of coordination: Secondary | ICD-10-CM | POA: Insufficient documentation

## 2020-11-20 DIAGNOSIS — R252 Cramp and spasm: Secondary | ICD-10-CM | POA: Diagnosis not present

## 2020-11-20 NOTE — Therapy (Signed)
[facility] [address], [place], [place], [address] Phone: [phone]   Fax:  [phone]  Physical Therapy Treatment  Patient Details  Name: Rebekah Tate MRN: [id number] Date of Birth: November 22, 1995 Referring Provider (PT): Lavena Bullion, [place]   Encounter Date: 11/20/2020   PT End of Session - 11/20/20 1115    Visit Number 8    Date for PT Re-Evaluation 01/01/21    Authorization Type [place] [employer]    PT Start Time 1105    PT Stop Time 1140    PT Time Calculation (min) 35 min    Activity Tolerance Patient tolerated treatment well    Behavior During Therapy [facility] for tasks assessed/performed           Past Medical History:  Diagnosis Date  . Allergy   . Anxiety   . Asthma    As a child uses as needed albuterol when flares exercise and respiratory infections.  . Depression    meds since 9th grade  dr Dwyane Dee  . Scoliosis   . Seasonal allergies     Past Surgical History:  Procedure Laterality Date  . ANAL RECTAL MANOMETRY N/A 10/10/2019   Procedure: ANO RECTAL MANOMETRY;  Surgeon: Lavena Bullion, DO;  Location: [facility];  Service: Gastroenterology;  Laterality: N/A;  . WISDOM TOOTH EXTRACTION      There were no vitals filed for this visit.   Subjective Assessment - 11/20/20 1108    Subjective Pt states her stomach and back are hurting.  Pt states pain is 7/10 around the house but working out pain is reduced 80%.  BM regularity is normal.  Pt states the dry needing helped last time    Pertinent History scoliosis, anxiety/depression, hx of suicide ideation, exercises 2+hours/day, chronic constipation    Patient Stated Goals have regular BMs    Currently in Pain? Yes    Pain Score 7     Pain Location Back    Pain Orientation Right;Left;Upper;Lower    Pain Descriptors / Indicators Aching    Pain Type Chronic pain    Pain Radiating Towards Lt hip and up to Rt shoulder    Pain Onset More than a  month ago    Pain Frequency Intermittent    Aggravating Factors  not sure but just walking around the house and sleeping    Pain Relieving Factors hard to find a comfortable position    Multiple Pain Sites No                             [facility] Adult PT Treatment/Exercise - 11/21/20 0001      Lumbar Exercises: Quadruped   Opposite Arm/Leg Raise Right arm/Left leg;Left arm/Right leg;10 reps   on ball   Other Quadruped Lumbar Exercises child pose with threading    Other Quadruped Lumbar Exercises side bend over ball      Manual Therapy   Soft tissue mobilization lumbar and thoracic paraspinlas bilat            Trigger Point Dry Needling - 11/21/20 0001    Consent Given? Yes    Education Handout Provided Previously provided    Lumbar multifidi Response Palpable increased muscle length;Twitch response elicited    Thoracic multifidi response Twitch response elicited;Palpable increased muscle length                  PT Short Term Goals -  11/14/20 1714      PT SHORT TERM GOAL #1   Title Pt will be I with initial HEP for core stability.     Status Achieved      PT SHORT TERM GOAL #2   Title Pt will report 30% easier time having a BM    Baseline 50% better    Status Achieved      PT SHORT TERM GOAL #3   Title Pt will be consistent with food diary to identify foods that may exacerbate constipation    Baseline eating the same things no patterns noticed    Status Deferred             PT Long Term Goals - 11/20/20 1108      PT LONG TERM GOAL #1   Title Pt will be able to have BM every 3-4 days due to improved muscle coordination    Baseline at least    Status Achieved      PT LONG TERM GOAL #2   Title Pt will report pain reduced at least 50% due to improved body mechanics with lifting    Baseline 80% better with the exercises at the gym    Status Achieved      PT LONG TERM GOAL #3   Title Pt will be ind wtih advanced HEP    Status On-going                  Plan - 11/21/20 1112    Clinical Impression Statement Pt has met goals for improved bowel funciton.  Pt still has posture abnormalities and muscle spasms throughout trunk.  Today's session focusing on improved core strength and reduced muscle spasms.  Pt had increased muscle length in her lumbar and thoracic on the Rt side.  No immediate changes to pain which was more on the left.  Pt may be ready for discharge as most goals met today and will benefit from skilled PT to ensure she is ind with HEP and able to maintain improved soft tissue length and function.    PT Treatment/Interventions ADLs/Self Care Home Management;Biofeedback;Cryotherapy;Electrical Stimulation;Moist Heat;Neuromuscular re-education;Therapeutic exercise;Therapeutic activities;Gait training;Patient/family education;Manual techniques;Dry needling;Passive range of motion;Taping    PT Next Visit Plan re-assess goals possibly discharge    PT Home Exercise Plan Access Code: [email or web address]    Consulted and Agree with Plan of Care Patient           Patient will benefit from skilled therapeutic intervention in order to improve the following deficits and impairments:  Decreased coordination,Decreased range of motion,Postural dysfunction,Decreased strength,Pain  Visit Diagnosis: Cramp and spasm  Unspecified lack of coordination     Problem List Patient Active Problem List   Diagnosis Date Noted  . Constipation due to outlet dysfunction   . Low back pain 09/23/2018  . Insomnia 10/07/2016  . Left ankle sprain 09/04/2016  . Suicidal ideation 08/20/2016  . MDD (major depressive disorder), recurrent severe, without psychosis ([place]) 08/20/2016  . Lower extremity pain 07/24/2016  . Idiopathic scoliosis 07/24/2016  . ADD (attention deficit disorder) without hyperactivity 05/15/2016    Jule Ser , PT 11/21/2020, 11:29 AM   [facility] [address], [place], [place], [address] Phone: [phone]   Fax:  [phone]  Name: Rebekah Tate MRN: [id number] Date of Birth: Mar 20, 1996

## 2020-11-22 ENCOUNTER — Other Ambulatory Visit (HOSPITAL_COMMUNITY): Payer: Self-pay | Admitting: Orthopedic Surgery

## 2020-11-23 ENCOUNTER — Other Ambulatory Visit: Payer: Self-pay

## 2020-11-23 ENCOUNTER — Ambulatory Visit: Payer: No Typology Code available for payment source | Admitting: Physical Therapy

## 2020-11-23 DIAGNOSIS — R252 Cramp and spasm: Secondary | ICD-10-CM | POA: Diagnosis not present

## 2020-11-23 DIAGNOSIS — R279 Unspecified lack of coordination: Secondary | ICD-10-CM

## 2020-11-23 NOTE — Therapy (Signed)
[facility] [address] [place], [place], [address] Phone: [phone]   Fax:  [phone]  Physical Therapy Treatment  Patient Details  Name: Rebekah Tate MRN: [id number] Date of Birth: 19-Apr-1996 Referring Provider (PT): Lavena Bullion, [place]   Encounter Date: 11/23/2020   PT End of Session - 11/23/20 1048    Visit Number 9    Date for PT Re-Evaluation 01/01/21    Authorization Type [employer]    PT Start Time 1016    PT Stop Time 1045    PT Time Calculation (min) 29 min    Activity Tolerance Patient tolerated treatment well    Behavior During Therapy [facility] for tasks assessed/performed           Past Medical History:  Diagnosis Date  . Allergy   . Anxiety   . Asthma    As a child uses as needed albuterol when flares exercise and respiratory infections.  . Depression    meds since 9th grade  dr Dwyane Dee  . Scoliosis   . Seasonal allergies     Past Surgical History:  Procedure Laterality Date  . ANAL RECTAL MANOMETRY N/A 10/10/2019   Procedure: ANO RECTAL MANOMETRY;  Surgeon: Lavena Bullion, DO;  Location: [facility];  Service: Gastroenterology;  Laterality: N/A;  . WISDOM TOOTH EXTRACTION      There were no vitals filed for this visit.   Subjective Assessment - 11/23/20 1051    Subjective Pt states still having regular BM and feels stronger. Still feeling back and neck pain, but hip pain has not been there since working on gluteal strength    Pertinent History scoliosis, anxiety/depression, hx of suicide ideation, exercises 2+hours/day, chronic constipation    Patient Stated Goals have regular BMs    Currently in Pain? No/denies              [facility] PT Assessment - 11/23/20 0001      Strength   Overall Strength Comments 5/5 MMT bilat hips                         [facility] Adult PT Treatment/Exercise - 11/23/20 0001      Manual Therapy   Soft tissue mobilization  lumbar and thoracic paraspinlas bilat; upper traps Lt side            Trigger Point Dry Needling - 11/23/20 0001    Consent Given? Yes    Education Handout Provided Previously provided    Lumbar multifidi Response Twitch response elicited;Palpable increased muscle length    Thoracic multifidi response Twitch response elicited;Palpable increased muscle length                  PT Short Term Goals - 11/14/20 1714      PT SHORT TERM GOAL #1   Title Pt will be I with initial HEP for core stability.     Status Achieved      PT SHORT TERM GOAL #2   Title Pt will report 30% easier time having a BM    Baseline 50% better    Status Achieved      PT SHORT TERM GOAL #3   Title Pt will be consistent with food diary to identify foods that may exacerbate constipation    Baseline eating the same things no patterns noticed    Status Deferred  PT Long Term Goals - 11/23/20 1048      PT LONG TERM GOAL #1   Title Pt will be able to have BM every 3-4 days due to improved muscle coordination    Status Achieved      PT LONG TERM GOAL #2   Title Pt will report pain reduced at least 50% due to improved body mechanics with lifting    Status Achieved      PT LONG TERM GOAL #3   Title Pt will be ind wtih advanced HEP    Status Achieved      PT LONG TERM GOAL #4   Title Pt will increase LE strength and stability to 5/5 for improved safety with gym routine.    Status Achieved                 Plan - 11/23/20 1054    Clinical Impression Statement Pt did well with MMT and has met all long term goals. Pt is ind with HEP at this time.  She still has tension in paraspinals muscles and feels relief from DN and STM but this is not currenlty limiting her bowel function and goals have been met.  Pt may return with an order to address back and neck pain if needed.    PT Treatment/Interventions ADLs/Self Care Home Management;Biofeedback;Cryotherapy;Electrical  Stimulation;Moist Heat;Neuromuscular re-education;Therapeutic exercise;Therapeutic activities;Gait training;Patient/family education;Manual techniques;Dry needling;Passive range of motion;Taping    PT Next Visit Plan d/c today    PT Home Exercise Plan Access Code: [email or web address]    Consulted and Agree with Plan of Care Patient           Patient will benefit from skilled therapeutic intervention in order to improve the following deficits and impairments:  Decreased coordination,Decreased range of motion,Postural dysfunction,Decreased strength,Pain  Visit Diagnosis: Cramp and spasm  Unspecified lack of coordination     Problem List Patient Active Problem List   Diagnosis Date Noted  . Constipation due to outlet dysfunction   . Low back pain 09/23/2018  . Insomnia 10/07/2016  . Left ankle sprain 09/04/2016  . Suicidal ideation 08/20/2016  . MDD (major depressive disorder), recurrent severe, without psychosis ([place]) 08/20/2016  . Lower extremity pain 07/24/2016  . Idiopathic scoliosis 07/24/2016  . ADD (attention deficit disorder) without hyperactivity 05/15/2016    Jule Ser, PT 11/23/2020, 10:56 AM  [facility] [address], [place], [place], [address] Phone: [phone]   Fax:  [phone]  Name: Rebekah Tate MRN: [id number] Date of Birth: Sep 15, 1996  PHYSICAL THERAPY DISCHARGE SUMMARY  Visits from Start of Care: 9  Current functional level related to goals / functional outcomes: See above goals   Remaining deficits: See above details   Education / Equipment: HEP  Plan: Patient agrees to discharge.  Patient goals were not met. Patient is being discharged due to meeting the stated rehab goals.  ?????     [employer], PT 11/23/20 10:57 AM

## 2020-11-26 ENCOUNTER — Encounter: Payer: No Typology Code available for payment source | Admitting: Physical Therapy

## 2020-11-29 ENCOUNTER — Telehealth: Payer: Self-pay | Admitting: Internal Medicine

## 2020-11-29 ENCOUNTER — Encounter: Payer: No Typology Code available for payment source | Admitting: Physical Therapy

## 2020-11-29 NOTE — Telephone Encounter (Signed)
Pt call and want a referral to chiropractor Dr.Hendel  For Insurance purpose .

## 2020-12-03 ENCOUNTER — Ambulatory Visit (INDEPENDENT_AMBULATORY_CARE_PROVIDER_SITE_OTHER): Payer: No Typology Code available for payment source | Admitting: Internal Medicine

## 2020-12-03 ENCOUNTER — Other Ambulatory Visit: Payer: Self-pay

## 2020-12-03 ENCOUNTER — Encounter: Payer: Self-pay | Admitting: Internal Medicine

## 2020-12-03 ENCOUNTER — Encounter: Payer: No Typology Code available for payment source | Admitting: Physical Therapy

## 2020-12-03 VITALS — BP 100/70 | HR 75 | Temp 98.5°F | Ht 60.0 in | Wt 101.2 lb

## 2020-12-03 DIAGNOSIS — M545 Low back pain, unspecified: Secondary | ICD-10-CM | POA: Diagnosis not present

## 2020-12-03 DIAGNOSIS — Z79899 Other long term (current) drug therapy: Secondary | ICD-10-CM

## 2020-12-03 DIAGNOSIS — M791 Myalgia, unspecified site: Secondary | ICD-10-CM | POA: Diagnosis not present

## 2020-12-03 DIAGNOSIS — M2550 Pain in unspecified joint: Secondary | ICD-10-CM

## 2020-12-03 DIAGNOSIS — M41124 Adolescent idiopathic scoliosis, thoracic region: Secondary | ICD-10-CM

## 2020-12-03 DIAGNOSIS — G8929 Other chronic pain: Secondary | ICD-10-CM

## 2020-12-03 NOTE — Telephone Encounter (Signed)
Spoke with the patient and she stated that her insurance company requires a referral from PCP to see Chiropractor. Patient stated that she has been seen only 1 time by Dr. Maryruth Bun. Appt made for 3/14 at 3pm.

## 2020-12-03 NOTE — Patient Instructions (Signed)
Will do referral. To chiro as  Discussed .  Will do effexor  Refill if needed  Will do a record review  Consider   Checking a few blood tests   Consider rheumatic disease   But nothing pointing to this at this time .

## 2020-12-03 NOTE — Progress Notes (Signed)
Chief Complaint  Patient presents with  . Referral    HPI: Rebekah Tate y.o. come in for discussing referral to chiropractor. Last visit here with me was  9 20 telemed.  She has had ongoing problems with back pain and her scoliosis for quite a while.  Saw Dr. Darrick Penna and sports medicine who eventually referred her to the spine doctor Dr. Margaretha Sheffield and Dr. Yevette Edwards.  They advised physical therapy which she has had has had dry needling among other modalities.  She has also been given muscle relaxant tizanidine to take at night which has helped her sleep some but does not care the difficulty.  She stays quite physically active which helps some but it never totally goes away.  She has had aggressive physical therapy.  In 2 weeks ago a physical therapist advised referral to chiropractor trial.  Specific appointment Dr. Lendon Collar on March 17 needs referral  She was also had GI evaluation for her GI symptoms which are quiescent at this time question lactose intolerance.    And when working out .   Walking long distance.     She has been under prescription care psychiatric for a number of years have been on Effexor 225 mg had reduced to 150 got withdrawal symptoms thought she was still on the 225 is an orange capsule but she has been on her current regimen for t at least the last 3 months .   To see  Dr Raeanne Barry psych in May.  She is unable to get her prescriptions from [facility] because she has been out of student status for a while.  Requests if needed and runs out of medicine if we could prescribe until she has the transition to new psychiatry.  .  ROS: See pertinent positives and negatives per HPI.  Currently working on getting jobs Job interviews,   commuincations sustanability studies . Moving     Gym.   Sleep  About  8 hours.    She has never seen a rheumatologist.  Hs a picking problem but keeps nails short  Ears some times  Past Medical History:  Diagnosis Date  . Allergy   . Anxiety   .  Asthma    As a child uses as needed albuterol when flares exercise and respiratory infections.  . Depression    meds since 9th grade  dr Lucianne Muss  . Scoliosis   . Seasonal allergies     Family History  Adopted: Yes  Family history unknown: Yes    Social History   Socioeconomic History  . Marital status: Single    Spouse name: Not on file  . Number of children: Not on file  . Years of education: Not on file  . Highest education level: Not on file  Occupational History  . Occupation: Consulting civil engineer  Tobacco Use  . Smoking status: Never Smoker  . Smokeless tobacco: Never Used  Vaping Use  . Vaping Use: Never used  Substance and Sexual Activity  . Alcohol use: No    Alcohol/week: 0.0 standard drinks  . Drug use: No  . Sexual activity: Never    Birth control/protection: Pill  Other Topics Concern  . Not on file  Social History Narrative   Adopted from [place] when she was [age] year old. Intact family.   Attended [employer]. Went to [employer] for 2 years and didn't like it although good grades early of education.   Applying to other  colleges considering state and [employer]   Negative TAD does regular exercise reports 2-3 hours of sleep; lives with parents and a dog at this time feels like she has a healthy situation   GOP 0 last Pap 06/20/2016   Social Determinants of Health   Financial Resource Strain: Not on file  Food Insecurity: Not on file  Transportation Needs: Not on file  Physical Activity: Not on file  Stress: Not on file  Social Connections: Not on file    Outpatient Medications Prior to Visit  Medication Sig Dispense Refill  . albuterol (VENTOLIN HFA) 108 (90 Base) MCG/ACT inhaler Inhale 2 puffs into the lungs daily as needed for wheezing or shortness of breath.    . docusate sodium (COLACE) 100 MG capsule Take 1 capsule (100 mg total) by mouth 2 (two) times daily. 60 capsule 3  . Norethindrone Acetate-Ethinyl Estrad-FE (LOESTRIN 24 FE) 1-20 MG-MCG(24) tablet  Take 1 tablet by mouth daily.    . polyethylene glycol (MIRALAX / GLYCOLAX) 17 g packet Take 17 g by mouth as needed.    Marland Kitchen TIZANIDINE HCL PO Take by mouth at bedtime.    Marland Kitchen venlafaxine XR (EFFEXOR-XR) 150 MG 24 hr capsule Take 225 mg by mouth daily.    . mirtazapine (REMERON) 7.5 MG tablet Take 7.5 mg by mouth at bedtime.    . Venlafaxine HCl 225 MG TB24   0   No facility-administered medications prior to visit.     EXAM:  BP 100/70 (BP Location: Left Arm, Patient Position: Sitting, Cuff Size: Normal)   Pulse 75   Temp 98.5 F (36.9 C) (Oral)   Ht 5' (1.524 m)   Wt 101 lb 3.2 oz (45.9 kg)   SpO2 99%   BMI 19.76 kg/m   Body mass index is 19.76 kg/m.  GENERAL: vitals reviewed and listed above, alert, oriented, appears well hydrated and in no acute distress HEENT: atraumatic, conjunctiva  clear, no obvious abnormalities on inspection of external nose and ears OP :masked ears look nk and no skin lesions NECK: no obvious masses on inspection palpation  LUNGS: clear to auscultation bilaterally, no wheezes, rales or rhonchi, good air movement CV: HRRR, no clubbing cyanosis or  peripheral edema nl cap refill  Abdomen:  Sof,t normal bowel sounds without hepatosplenomegaly, no guarding rebound or masses no CVA tenderness MS: moves all extremities without noticeable focal  Abnormality distal nails  Changes  chronic trauma  Skin clear otherwise  Good muscle mass  scoliosis noted  Sitting  Gail grossly nl.  PSYCH: pleasant and cooperative, no obvious depression or anxiety Lab Results  Component Value Date   WBC 5.4 07/26/2019   HGB 12.6 07/26/2019   HCT 38.9 07/26/2019   PLT 278.0 07/26/2019   GLUCOSE 90 07/26/2019   CHOL 157 08/21/2016   TRIG 81 08/21/2016   HDL 75 08/21/2016   LDLCALC 66 08/21/2016   ALT 14 08/21/2016   ALT 15 08/21/2016   AST 20 08/21/2016   AST 19 08/21/2016   NA 137 07/26/2019   K 3.9 07/26/2019   CL 104 07/26/2019   CREATININE 0.69 07/26/2019   BUN 14  07/26/2019   CO2 27 07/26/2019   TSH 2.20 07/26/2019   BP Readings from Last 3 Encounters:  12/03/20 100/70  07/12/20 120/62  09/21/19 90/66    ASSESSMENT AND PLAN:  Discussed the following assessment and plan:  Chronic midline low back pain without sciatica - Plan: Ambulatory referral to Chiropractic  Adolescent idiopathic scoliosis  of thoracic region - Plan: Ambulatory referral to Chiropractic  Medication management   Pciking behanvior   She is trying continued management for her chronic back predicament although does describe other musculoskeletal pain... that she is tolerating.Using hs tizanidine with some help  No signs of acute inflammatory disease but will review record consider checking a few screening tests or even a rheumatologic evaluation as indicated. If she needs transition bridge refill medication before she sees Dr. Evelene Croon  send [place] a request and we can do this.  At this time would not change her dose of medicine she is already tapered down to 150 from what we can tell.  On the Effexor. -Patient advised to return or notify health care team  if  new concerns arise.  Patient Instructions  Will do referral. To chiro as  Discussed .  Will do effexor  Refill if needed  Will do a record review  Consider   Checking a few blood tests   Consider rheumatic disease   But nothing pointing to this at this time .    Neta Mends. Kensy Blizard M.D.

## 2020-12-03 NOTE — Telephone Encounter (Signed)
Last visit with me was September 2020 televisit .   For all  referrals need  Diagnosis   Is she already in care?   Needs visit  And more information

## 2020-12-04 ENCOUNTER — Telehealth: Payer: Self-pay

## 2020-12-04 ENCOUNTER — Other Ambulatory Visit: Payer: No Typology Code available for payment source

## 2020-12-04 DIAGNOSIS — M791 Myalgia, unspecified site: Secondary | ICD-10-CM

## 2020-12-04 DIAGNOSIS — G8929 Other chronic pain: Secondary | ICD-10-CM

## 2020-12-04 DIAGNOSIS — M545 Low back pain, unspecified: Secondary | ICD-10-CM

## 2020-12-04 NOTE — Telephone Encounter (Signed)
Patient informed of the message below. Lab appt scheduled for 3/15.

## 2020-12-04 NOTE — Telephone Encounter (Signed)
-----   Message from Madelin Headings, MD sent at 12/03/2020  6:19 PM EDT ----- Please  tell patient I reviewed record and ordered lab work .to be complete and look for additional causes of her   Musculoskeletal pain .  Have her make   Lab appt at her convenience  ( do not have to fast ) thanks

## 2020-12-06 ENCOUNTER — Encounter: Payer: No Typology Code available for payment source | Admitting: Physical Therapy

## 2020-12-10 ENCOUNTER — Encounter: Payer: No Typology Code available for payment source | Admitting: Physical Therapy

## 2020-12-13 ENCOUNTER — Encounter: Payer: No Typology Code available for payment source | Admitting: Physical Therapy

## 2020-12-17 ENCOUNTER — Encounter: Payer: No Typology Code available for payment source | Admitting: Physical Therapy

## 2020-12-20 ENCOUNTER — Encounter: Payer: No Typology Code available for payment source | Admitting: Physical Therapy

## 2020-12-24 ENCOUNTER — Encounter: Payer: No Typology Code available for payment source | Admitting: Physical Therapy

## 2020-12-27 ENCOUNTER — Encounter: Payer: No Typology Code available for payment source | Admitting: Physical Therapy

## 2020-12-28 ENCOUNTER — Other Ambulatory Visit (HOSPITAL_COMMUNITY): Payer: Self-pay

## 2020-12-28 MED FILL — Norethindrone Ace & Ethinyl Estradiol-FE Tab 1 MG-20 MCG: ORAL | 63 days supply | Qty: 84 | Fill #0 | Status: AC

## 2020-12-31 ENCOUNTER — Encounter: Payer: No Typology Code available for payment source | Admitting: Physical Therapy

## 2021-01-02 ENCOUNTER — Other Ambulatory Visit (HOSPITAL_COMMUNITY): Payer: Self-pay

## 2021-01-03 ENCOUNTER — Encounter: Payer: No Typology Code available for payment source | Admitting: Physical Therapy

## 2021-01-07 ENCOUNTER — Encounter: Payer: No Typology Code available for payment source | Admitting: Physical Therapy

## 2021-01-10 ENCOUNTER — Encounter: Payer: No Typology Code available for payment source | Admitting: Physical Therapy

## 2021-01-14 ENCOUNTER — Encounter: Payer: No Typology Code available for payment source | Admitting: Physical Therapy

## 2021-01-17 ENCOUNTER — Encounter: Payer: No Typology Code available for payment source | Admitting: Physical Therapy

## 2021-01-21 ENCOUNTER — Other Ambulatory Visit (HOSPITAL_COMMUNITY): Payer: Self-pay

## 2021-01-21 ENCOUNTER — Other Ambulatory Visit: Payer: Self-pay

## 2021-01-21 ENCOUNTER — Encounter: Payer: Self-pay | Admitting: Internal Medicine

## 2021-01-21 ENCOUNTER — Ambulatory Visit (INDEPENDENT_AMBULATORY_CARE_PROVIDER_SITE_OTHER): Payer: No Typology Code available for payment source | Admitting: Internal Medicine

## 2021-01-21 VITALS — BP 101/68 | Temp 98.2°F | Ht 60.0 in | Wt 102.8 lb

## 2021-01-21 DIAGNOSIS — L255 Unspecified contact dermatitis due to plants, except food: Secondary | ICD-10-CM | POA: Diagnosis not present

## 2021-01-21 DIAGNOSIS — L089 Local infection of the skin and subcutaneous tissue, unspecified: Secondary | ICD-10-CM

## 2021-01-21 DIAGNOSIS — Z79899 Other long term (current) drug therapy: Secondary | ICD-10-CM

## 2021-01-21 MED ORDER — ALBUTEROL SULFATE HFA 108 (90 BASE) MCG/ACT IN AERS
2.0000 | INHALATION_SPRAY | Freq: Every day | RESPIRATORY_TRACT | 2 refills | Status: DC | PRN
  Filled 2021-01-21: qty 18, 30d supply, fill #0

## 2021-01-21 MED ORDER — CEPHALEXIN 500 MG PO CAPS
500.0000 mg | ORAL_CAPSULE | Freq: Three times a day (TID) | ORAL | 0 refills | Status: AC
  Filled 2021-01-21: qty 15, 5d supply, fill #0

## 2021-01-21 MED ORDER — PREDNISONE 20 MG PO TABS
ORAL_TABLET | ORAL | 0 refills | Status: AC
  Filled 2021-01-21: qty 24, 12d supply, fill #0

## 2021-01-21 MED ORDER — FLUOCINONIDE EMULSIFIED BASE 0.05 % EX CREA
1.0000 "application " | TOPICAL_CREAM | Freq: Two times a day (BID) | CUTANEOUS | 0 refills | Status: DC
  Filled 2021-01-21: qty 30, 15d supply, fill #0

## 2021-01-21 MED FILL — Tizanidine HCl Tab 4 MG (Base Equivalent): ORAL | 15 days supply | Qty: 60 | Fill #0 | Status: AC

## 2021-01-21 NOTE — Progress Notes (Signed)
Chief Complaint  Patient presents with  . Rash  . Poison Ivy    Patient complains of rash, x2 days, Tried ice, Benadryl, Cortisone cream with little relief    HPI: Rebekah Tate 25 y.o. come in for   Above .   Onset a couple days exposure to working in yard weeds and poison ivy about a week ago.  Increasing breakouts neck upper back is already on her arms a couple areas breaking out on her face.  She has not had this experience before but her parents got some poison ivy contact dermatitis at the same exposure. She is used over-the-counter hydrocortisone and Benadryl with little relief.  In addition there is an abrasion injury when she ran into a bush this area does not itch but has some hurt it is "pressing up" has been putting antibiotic topical on this.  Little bit sore when she walks but no fever.  Would like her albuterol refilled uses it infrequently but expired.  At most 2 times a week or less.   ROS: See pertinent positives and negatives per HPI.  Past Medical History:  Diagnosis Date  . Allergy   . Anxiety   . Asthma    As a child uses as needed albuterol when flares exercise and respiratory infections.  . Depression    meds since 9th grade  dr Lucianne Muss  . Scoliosis   . Seasonal allergies     Family History  Adopted: Yes  Family history unknown: Yes    Social History   Socioeconomic History  . Marital status: Single    Spouse name: Not on file  . Number of children: Not on file  . Years of education: Not on file  . Highest education level: Not on file  Occupational History  . Occupation: Consulting civil engineer  Tobacco Use  . Smoking status: Never Smoker  . Smokeless tobacco: Never Used  Vaping Use  . Vaping Use: Never used  Substance and Sexual Activity  . Alcohol use: No    Alcohol/week: 0.0 standard drinks  . Drug use: No  . Sexual activity: Never    Birth control/protection: Pill  Other Topics Concern  . Not on file  Social History Narrative   Adopted from  [place] when she was [age] year old. Intact family.   Attended [employer]. Went to [employer] for 2 years and didn't like it although good grades early of education.   Applying to other colleges considering state and [employer]   Negative TAD does regular exercise reports 2-3 hours of sleep; lives with parents and a dog at this time feels like she has a healthy situation   GOP 0 last Pap 06/20/2016   Social Determinants of Health   Financial Resource Strain: Not on file  Food Insecurity: Not on file  Transportation Needs: Not on file  Physical Activity: Not on file  Stress: Not on file  Social Connections: Not on file    Outpatient Medications Prior to Visit  Medication Sig Dispense Refill  . docusate sodium (COLACE) 100 MG capsule Take 1 capsule (100 mg total) by mouth 2 (two) times daily. 60 capsule 3  . methocarbamol (ROBAXIN) 500 MG tablet TAKE 1 TABLET BY MOUTH EVERY 6-8 HOURS AS NEEDED FOR MUSCLE SPASMS OR TENSION 60 tablet 2  . Norethindrone Acetate-Ethinyl Estrad-FE (LOESTRIN 24 FE) 1-20 MG-MCG(24) tablet Take 1 tablet by mouth daily.    . norethindrone-ethinyl estradiol (LOESTRIN FE) 1-20 MG-MCG tablet TAKE 1 TABLET DAILY--TAKE  ACTIVE PILLS CONTINUOUSLY, NO PLACEBOS 84 tablet 2  . polyethylene glycol (MIRALAX / GLYCOLAX) 17 g packet Take 17 g by mouth as needed.    Marland Kitchen tiZANidine (ZANAFLEX) 4 MG tablet TAKE 1 TABLET BY MOUTH EVERY 6 TO 8 HOURS AS NEEDED 60 tablet 1  . TIZANIDINE HCL PO Take by mouth at bedtime.    Marland Kitchen venlafaxine XR (EFFEXOR-XR) 150 MG 24 hr capsule Take 225 mg by mouth daily.    Marland Kitchen venlafaxine XR (EFFEXOR-XR) 150 MG 24 hr capsule TAKE 1 CAPSULE BY MOUTH ONCE DAILY WITH FOOD. 90 capsule 0  . venlafaxine XR (EFFEXOR-XR) 150 MG 24 hr capsule TAKE 1 CAPSULE BY MOUTH ONCE A DAY WITH FOOD 90 capsule 0  . albuterol (VENTOLIN HFA) 108 (90 Base) MCG/ACT inhaler Inhale 2 puffs into the lungs daily as needed for wheezing or shortness of breath.     No facility-administered  medications prior to visit.     EXAM:  BP 101/68 (BP Location: Left Arm, Patient Position: Sitting, Cuff Size: Normal)   Temp 98.2 F (36.8 C) (Oral)   Ht 5' (1.524 m)   Wt 102 lb 12.8 oz (46.6 kg)   BMI 20.08 kg/m   Body mass index is 20.08 kg/m.  GENERAL: vitals reviewed and listed above, alert, oriented, appears well hydrated and in no acute distress but scratching intermittently. HEENT: atraumatic, conjunctiva  clear, no obvious abnormalities on inspection of external nose and ears OP : masked NECK: no obvious masses on inspection palpation  LUNGS: clear to auscultation bilaterally, no wheezes, rales or rhonchi, good air movement CV: HRRR, no clubbing cyanosis or  peripheral edema nl cap refill  MS: moves all extremities without noticeable focal  Abnormality Skin contact dermatitis on all 4 extremities the most on the right antecubital area with some linear pattern distribution few on face there is a 1 to 2 cm area with golden crusting that may be her antibiotic ointment in the right lower anterior leg.  Minimal tenderness slight erythema no streaking.  Lab Results  Component Value Date   WBC 5.4 07/26/2019   HGB 12.6 07/26/2019   HCT 38.9 07/26/2019   PLT 278.0 07/26/2019   GLUCOSE 90 07/26/2019   CHOL 157 08/21/2016   TRIG 81 08/21/2016   HDL 75 08/21/2016   LDLCALC 66 08/21/2016   ALT 14 08/21/2016   ALT 15 08/21/2016   AST 20 08/21/2016   AST 19 08/21/2016   NA 137 07/26/2019   K 3.9 07/26/2019   CL 104 07/26/2019   CREATININE 0.69 07/26/2019   BUN 14 07/26/2019   CO2 27 07/26/2019   TSH 2.20 07/26/2019   BP Readings from Last 3 Encounters:  01/21/21 101/68  12/03/20 100/70  07/12/20 120/62    ASSESSMENT AND PLAN:  Discussed the following assessment and plan:  Rhus dermatitis  Skin infection - Possibly early from plant injury right lower extremity add short course antibiotics especially while she is on the steroids.  Medication management Contact  dermatitis most likely from plant poison ivy like continuing to break out all 4 extremities and some on face but no alarming findings edema. Topical Lidex on early lesions local care avoid scratching 12-day prednisone taper can taper more quickly if helpful try oral Benadryl avoidance.  Cool instead of heat  We will refill her albuterol.  -Patient advised to return or notify health care team  if  new concerns arise.  Patient Instructions   Cool compresses  topcial steroid on newer  areas. Oral prednisone steroid taper.can taper  Quicker as tolerated .   Local care and antibiotic for  Abrasion lesion.   Poison Ivy Dermatitis Poison ivy dermatitis is inflammation of the skin that is caused by chemicals in the leaves of the poison ivy plant. The skin reaction often involves redness, swelling, blisters, and extreme itching. What are the causes? This condition is caused by a chemical (urushiol) found in the sap of the poison ivy plant. This chemical is sticky and can be easily spread to people, animals, and objects. You can get poison ivy dermatitis by:  Having direct contact with a poison ivy plant.  Touching animals, other people, or objects that have come in contact with poison ivy and have the chemical on them. What increases the risk? This condition is more likely to develop in people who:  Are outdoors often in wooded or [place] areas.  Go outdoors without wearing protective clothing, such as closed shoes, long pants, and a long-sleeved shirt. What are the signs or symptoms? Symptoms of this condition include:  Redness of the skin.  Extreme itching.  A rash that often includes bumps and blisters. The rash usually appears 48 hours after exposure, if you have been exposed before. If this is the first time you have been exposed, the rash may not appear until a week after exposure.  Swelling. This may occur if the reaction is more severe. Symptoms usually last for 1-2 weeks.  However, the first time you develop this condition, symptoms may last 3-4 weeks.   How is this diagnosed? This condition may be diagnosed based on your symptoms and a physical exam. Your health care provider may also ask you about any recent outdoor activity. How is this treated? Treatment for this condition will vary depending on how severe it is. Treatment may include:  Hydrocortisone cream or calamine lotion to relieve itching.  Oatmeal baths to soothe the skin.  Medicines, such as over-the-counter antihistamine tablets.  Oral steroid medicine, for more severe reactions. Follow these instructions at home: Medicines  Take or apply over-the-counter and prescription medicines only as told by your health care provider.  Use hydrocortisone cream or calamine lotion as needed to soothe the skin and relieve itching. General instructions  Do not scratch or rub your skin.  Apply a cold, wet cloth (cold compress) to the affected areas or take baths in cool water. This will help with itching. Avoid hot baths and showers.  Take oatmeal baths as needed. Use colloidal oatmeal. You can get this at your local pharmacy or grocery store. Follow the instructions on the packaging.  While you have the rash, wash clothes right after you wear them.  Keep all follow-up visits as told by your health care provider. This is important. How is this prevented?  Learn to identify the poison ivy plant and avoid contact with the plant. This plant can be recognized by the number of leaves. Generally, poison ivy has three leaves with flowering branches on a single stem. The leaves are typically glossy, and they have jagged edges that come to a point at the front.  If you have been exposed to poison ivy, thoroughly wash with soap and water right away. You have about 30 minutes to remove the plant resin before it will cause the rash. Be sure to wash under your fingernails, because any plant resin there will continue  to spread the rash.  When hiking or camping, wear clothes that will help you to avoid exposure  on the skin. This includes long pants, a long-sleeved shirt, tall socks, and hiking boots. You can also apply preventive lotion to your skin to help limit exposure.  If you suspect that your clothes or outdoor gear came in contact with poison ivy, rinse them off outside with a garden hose before you bring them inside your house.  When doing yard work or gardening, wear gloves, long sleeves, long pants, and boots. Wash your garden tools and gloves if they come in contact with poison ivy.  If you suspect that your pet has come into contact with poison ivy, wash him or her with pet shampoo and water. Make sure to wear gloves while washing your pet.   Contact a health care provider if you have:  Open sores in the rash area.  More redness, swelling, or pain in the affected area.  Redness that spreads beyond the rash area.  Fluid, blood, or pus coming from the affected area.  A fever.  A rash over a large area of your body.  A rash on your eyes, mouth, or genitals.  A rash that does not improve after a few weeks. Get help right away if:  Your face swells or your eyes swell shut.  You have trouble breathing.  You have trouble swallowing. These symptoms may represent a serious problem that is an emergency. Do not wait to see if the symptoms will go away. Get medical help right away. Call your local emergency services (911 in the [place] Do not drive yourself to the hospital. Summary  Poison ivy dermatitis is inflammation of the skin that is caused by chemicals in the leaves of the poison ivy plant.  Symptoms of this condition include redness, itching, a rash, and swelling.  Do not scratch or rub your skin.  Take or apply over-the-counter and prescription medicines only as told by your health care provider. This information is not intended to replace advice given to you by your health care  provider. Make sure you discuss any questions you have with your health care provider. Document Revised: 12/31/2018 Document Reviewed: 09/03/2018 Elsevier Patient Education  2021 [employer].     [place]. Garyson Stelly M.D.

## 2021-01-21 NOTE — Patient Instructions (Signed)
Cool compresses  topcial steroid on newer areas. Oral prednisone steroid taper.can taper  Quicker as tolerated .   Local care and antibiotic for  Abrasion lesion.   Poison Ivy Dermatitis Poison ivy dermatitis is inflammation of the skin that is caused by chemicals in the leaves of the poison ivy plant. The skin reaction often involves redness, swelling, blisters, and extreme itching. What are the causes? This condition is caused by a chemical (urushiol) found in the sap of the poison ivy plant. This chemical is sticky and can be easily spread to people, animals, and objects. You can get poison ivy dermatitis by:  Having direct contact with a poison ivy plant.  Touching animals, other people, or objects that have come in contact with poison ivy and have the chemical on them. What increases the risk? This condition is more likely to develop in people who:  Are outdoors often in wooded or [place] areas.  Go outdoors without wearing protective clothing, such as closed shoes, long pants, and a long-sleeved shirt. What are the signs or symptoms? Symptoms of this condition include:  Redness of the skin.  Extreme itching.  A rash that often includes bumps and blisters. The rash usually appears 48 hours after exposure, if you have been exposed before. If this is the first time you have been exposed, the rash may not appear until a week after exposure.  Swelling. This may occur if the reaction is more severe. Symptoms usually last for 1-2 weeks. However, the first time you develop this condition, symptoms may last 3-4 weeks.   How is this diagnosed? This condition may be diagnosed based on your symptoms and a physical exam. Your health care provider may also ask you about any recent outdoor activity. How is this treated? Treatment for this condition will vary depending on how severe it is. Treatment may include:  Hydrocortisone cream or calamine lotion to relieve itching.  Oatmeal baths  to soothe the skin.  Medicines, such as over-the-counter antihistamine tablets.  Oral steroid medicine, for more severe reactions. Follow these instructions at home: Medicines  Take or apply over-the-counter and prescription medicines only as told by your health care provider.  Use hydrocortisone cream or calamine lotion as needed to soothe the skin and relieve itching. General instructions  Do not scratch or rub your skin.  Apply a cold, wet cloth (cold compress) to the affected areas or take baths in cool water. This will help with itching. Avoid hot baths and showers.  Take oatmeal baths as needed. Use colloidal oatmeal. You can get this at your local pharmacy or grocery store. Follow the instructions on the packaging.  While you have the rash, wash clothes right after you wear them.  Keep all follow-up visits as told by your health care provider. This is important. How is this prevented?  Learn to identify the poison ivy plant and avoid contact with the plant. This plant can be recognized by the number of leaves. Generally, poison ivy has three leaves with flowering branches on a single stem. The leaves are typically glossy, and they have jagged edges that come to a point at the front.  If you have been exposed to poison ivy, thoroughly wash with soap and water right away. You have about 30 minutes to remove the plant resin before it will cause the rash. Be sure to wash under your fingernails, because any plant resin there will continue to spread the rash.  When hiking or camping, wear clothes that  will help you to avoid exposure on the skin. This includes long pants, a long-sleeved shirt, tall socks, and hiking boots. You can also apply preventive lotion to your skin to help limit exposure.  If you suspect that your clothes or outdoor gear came in contact with poison ivy, rinse them off outside with a garden hose before you bring them inside your house.  When doing yard work or  gardening, wear gloves, long sleeves, long pants, and boots. Wash your garden tools and gloves if they come in contact with poison ivy.  If you suspect that your pet has come into contact with poison ivy, wash him or her with pet shampoo and water. Make sure to wear gloves while washing your pet.   Contact a health care provider if you have:  Open sores in the rash area.  More redness, swelling, or pain in the affected area.  Redness that spreads beyond the rash area.  Fluid, blood, or pus coming from the affected area.  A fever.  A rash over a large area of your body.  A rash on your eyes, mouth, or genitals.  A rash that does not improve after a few weeks. Get help right away if:  Your face swells or your eyes swell shut.  You have trouble breathing.  You have trouble swallowing. These symptoms may represent a serious problem that is an emergency. Do not wait to see if the symptoms will go away. Get medical help right away. Call your local emergency services (911 in the [place] Do not drive yourself to the hospital. Summary  Poison ivy dermatitis is inflammation of the skin that is caused by chemicals in the leaves of the poison ivy plant.  Symptoms of this condition include redness, itching, a rash, and swelling.  Do not scratch or rub your skin.  Take or apply over-the-counter and prescription medicines only as told by your health care provider. This information is not intended to replace advice given to you by your health care provider. Make sure you discuss any questions you have with your health care provider. Document Revised: 12/31/2018 Document Reviewed: 09/03/2018 Elsevier Patient Education  2021 [employer].

## 2021-01-22 ENCOUNTER — Other Ambulatory Visit (HOSPITAL_COMMUNITY): Payer: Self-pay

## 2021-01-22 ENCOUNTER — Telehealth: Payer: Self-pay | Admitting: Internal Medicine

## 2021-01-22 NOTE — Telephone Encounter (Signed)
Patient is calling and requesting a call back regarding medication, please advise. CB is [phone].

## 2021-01-22 NOTE — Telephone Encounter (Signed)
I have not been managing this medication.  Would  Refer to managing prescribing provider for refill.  Please  Update the dose and provider hwo has been prescribing  medication

## 2021-01-23 ENCOUNTER — Other Ambulatory Visit: Payer: Self-pay | Admitting: Internal Medicine

## 2021-01-23 ENCOUNTER — Other Ambulatory Visit (HOSPITAL_COMMUNITY): Payer: Self-pay

## 2021-01-23 NOTE — Telephone Encounter (Signed)
Patient is no longer see that doctor because she has graduated college.  She starts seeing a new psychiatrist May 10th   She said that Dr. Fabian Sharp told her that she will prescribe this medication until she starts seeing a new psychiatrist.   She is completely out of this medication and will start having withdrawals 2 hours off not taking it.

## 2021-01-24 ENCOUNTER — Other Ambulatory Visit (HOSPITAL_COMMUNITY): Payer: Self-pay

## 2021-01-24 NOTE — Telephone Encounter (Signed)
Dr. Fabian Sharp is currently out of the office until 01/28/21. Please see below messages.  Patient  Is  requesting Venlafaxine XR prescription.

## 2021-01-25 ENCOUNTER — Other Ambulatory Visit: Payer: Self-pay | Admitting: Internal Medicine

## 2021-01-25 ENCOUNTER — Other Ambulatory Visit (HOSPITAL_COMMUNITY): Payer: Self-pay

## 2021-01-25 MED ORDER — VENLAFAXINE HCL ER 150 MG PO CP24
150.0000 mg | ORAL_CAPSULE | Freq: Every day | ORAL | 0 refills | Status: DC
  Filled 2021-01-25: qty 30, 30d supply, fill #0

## 2021-01-25 NOTE — Telephone Encounter (Signed)
I sent in a 30 day supply to [facility] pharmacy

## 2021-01-26 ENCOUNTER — Other Ambulatory Visit (HOSPITAL_COMMUNITY): Payer: Self-pay

## 2021-01-28 ENCOUNTER — Other Ambulatory Visit (HOSPITAL_COMMUNITY): Payer: Self-pay

## 2021-01-28 NOTE — Telephone Encounter (Signed)
Noted  

## 2021-01-29 ENCOUNTER — Other Ambulatory Visit (HOSPITAL_COMMUNITY): Payer: Self-pay

## 2021-01-29 MED ORDER — FLUOXETINE HCL 20 MG PO CAPS
60.0000 mg | ORAL_CAPSULE | Freq: Every day | ORAL | 4 refills | Status: DC
  Filled 2021-01-29: qty 270, 90d supply, fill #0
  Filled 2021-05-30: qty 270, 90d supply, fill #1

## 2021-01-29 MED ORDER — VENLAFAXINE HCL ER 37.5 MG PO CP24
112.5000 mg | ORAL_CAPSULE | Freq: Every day | ORAL | 1 refills | Status: DC
  Filled 2021-01-29: qty 90, 30d supply, fill #0
  Filled 2021-02-28: qty 90, 30d supply, fill #1

## 2021-02-05 ENCOUNTER — Telehealth: Payer: No Typology Code available for payment source | Admitting: Adult Health

## 2021-02-28 ENCOUNTER — Other Ambulatory Visit (HOSPITAL_COMMUNITY): Payer: Self-pay

## 2021-02-28 MED FILL — Norethindrone Ace & Ethinyl Estradiol-FE Tab 1 MG-20 MCG: ORAL | 56 days supply | Qty: 56 | Fill #1 | Status: CN

## 2021-02-28 MED FILL — Norethindrone Ace & Ethinyl Estradiol-FE Tab 1 MG-20 MCG: ORAL | 42 days supply | Qty: 56 | Fill #1 | Status: AC

## 2021-03-26 ENCOUNTER — Other Ambulatory Visit (HOSPITAL_COMMUNITY): Payer: Self-pay

## 2021-03-29 ENCOUNTER — Other Ambulatory Visit (HOSPITAL_COMMUNITY): Payer: Self-pay

## 2021-03-29 MED ORDER — NORETHIN ACE-ETH ESTRAD-FE 1-20 MG-MCG PO TABS
ORAL_TABLET | ORAL | 4 refills | Status: DC
  Filled 2021-03-29 – 2021-04-23 (×2): qty 84, 63d supply, fill #0
  Filled 2021-06-21: qty 84, 63d supply, fill #1
  Filled 2021-08-19: qty 84, 63d supply, fill #2
  Filled 2021-10-30: qty 84, 63d supply, fill #3
  Filled 2022-01-03: qty 84, 63d supply, fill #4

## 2021-04-23 ENCOUNTER — Other Ambulatory Visit (HOSPITAL_COMMUNITY): Payer: Self-pay

## 2021-05-03 IMAGING — DX DG SCOLIOSIS EVAL COMPLETE SPINE 1V
1 series · 1 of 1 positions shown · non-contrast
Comparison: 07/24/2016

CLINICAL DATA: Scoliosis

EXAM:
DG SCOLIOSIS EVAL COMPLETE SPINE 1V

[dg scoliosis ap]
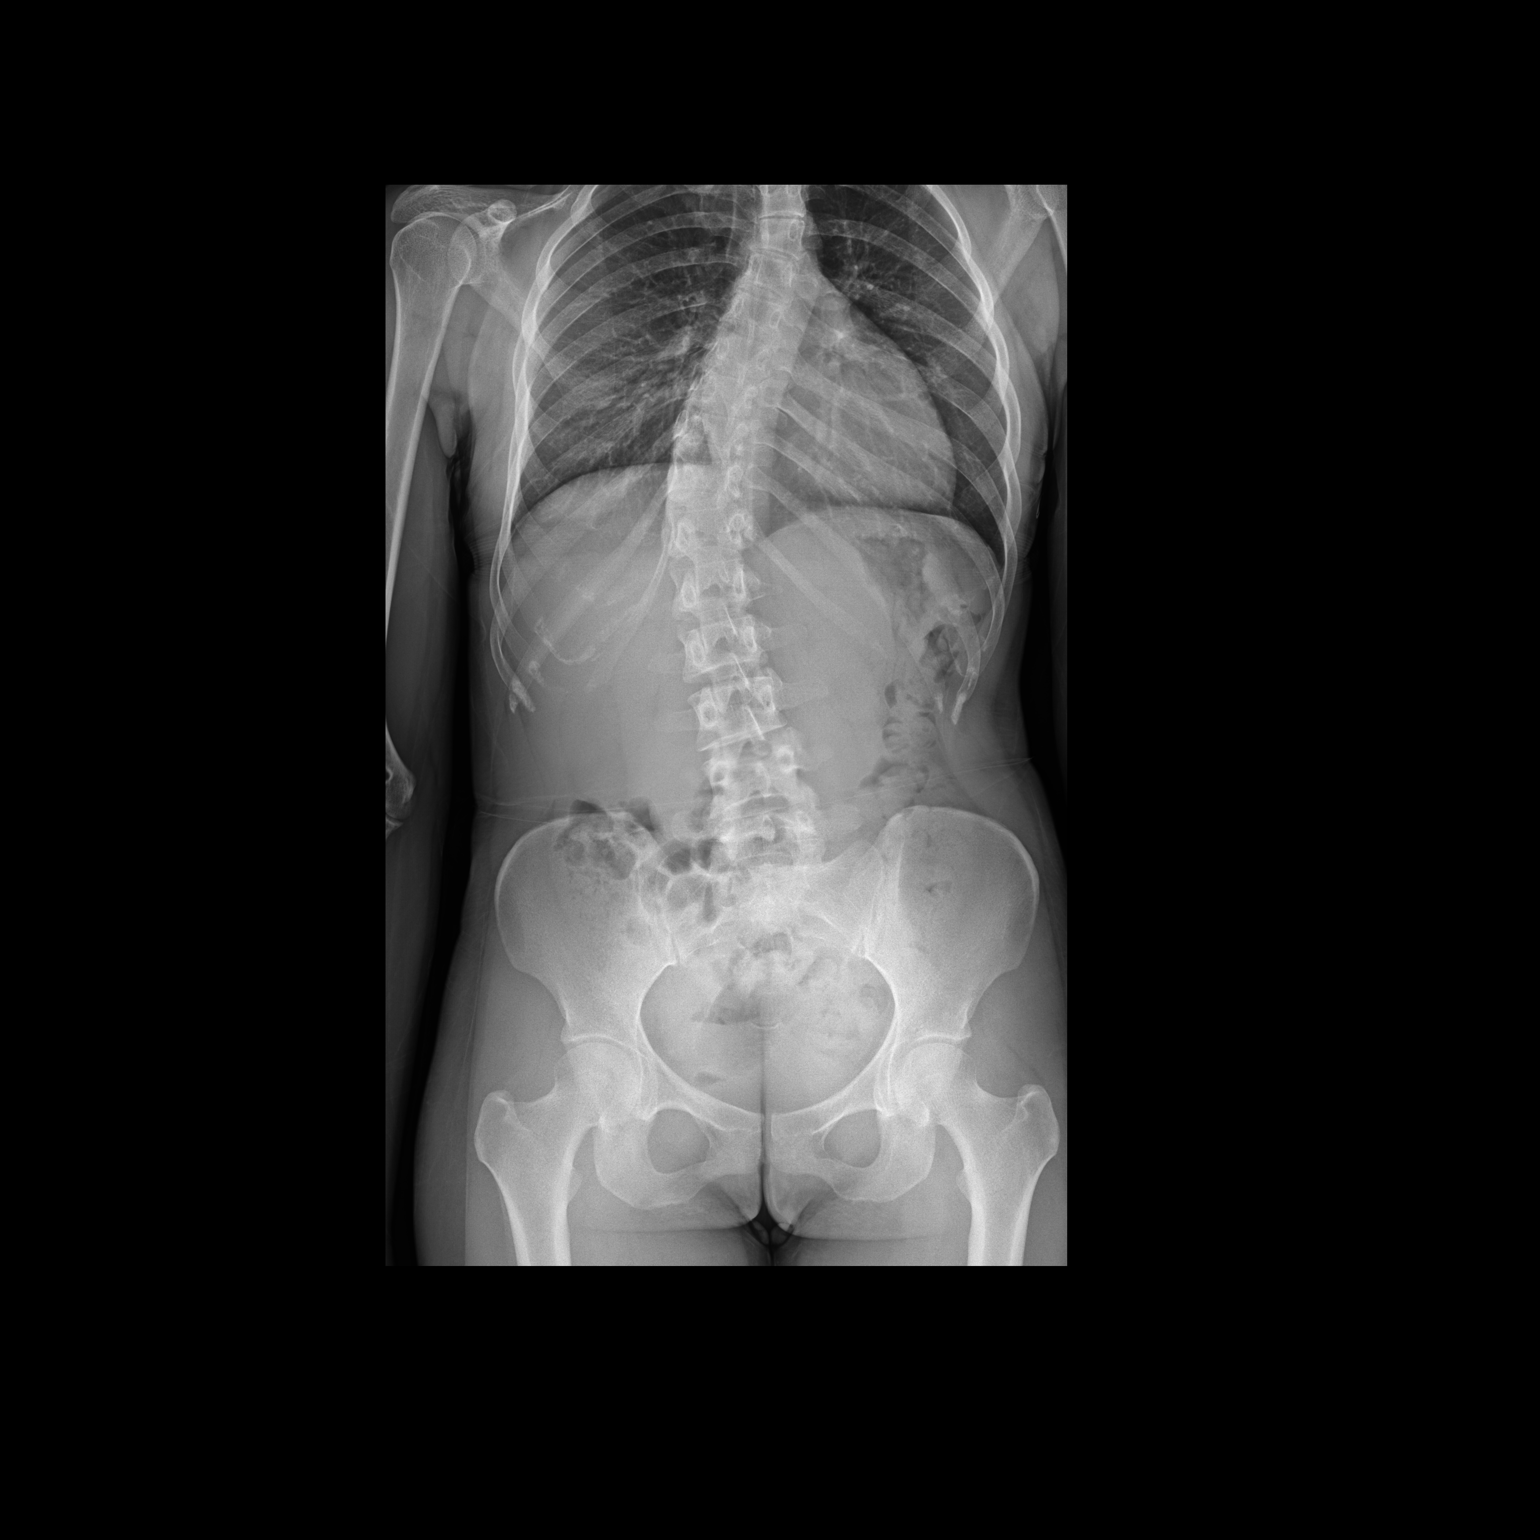

[1 of 1 positions shown; findings below may reference images not displayed]

FINDINGS: Levoscoliosis of the upper thoracic spine measuring 37 degrees from
superior endplate of T1 to inferior endplate of T8. 30 degree
dextroscoliosis of the thoracolumbar spine from superior endplate T6
to inferior endplate of L3. No congenital vertebral anomalies.
IMPRESSION: Scoliosis of the thoracolumbar spine as above.

## 2021-05-30 ENCOUNTER — Other Ambulatory Visit (HOSPITAL_COMMUNITY): Payer: Self-pay

## 2021-06-21 ENCOUNTER — Other Ambulatory Visit (HOSPITAL_COMMUNITY): Payer: Self-pay

## 2021-08-19 ENCOUNTER — Other Ambulatory Visit (HOSPITAL_COMMUNITY): Payer: Self-pay

## 2021-09-30 ENCOUNTER — Telehealth: Payer: Self-pay | Admitting: Internal Medicine

## 2021-09-30 NOTE — Telephone Encounter (Signed)
Patient brought in paperwork that she needs Dr.Panosh to complete. Paperwork would be placed in folder.  Patient would like to be contacted at [phone] when paperwork is complete.  Please advise.

## 2021-10-04 ENCOUNTER — Other Ambulatory Visit (HOSPITAL_COMMUNITY): Payer: Self-pay

## 2021-10-04 MED ORDER — TIZANIDINE HCL 4 MG PO TABS
4.0000 mg | ORAL_TABLET | Freq: Every day | ORAL | 2 refills | Status: DC
  Filled 2021-10-04: qty 60, 30d supply, fill #0

## 2021-10-07 NOTE — Telephone Encounter (Signed)
Patient is calling back to see what's the update on this paperwork. Patient brought the paper in on 01/09 and needs it by tomorrow.  Patient is requesting a phone call back at [phone].  Please advise.

## 2021-10-08 ENCOUNTER — Ambulatory Visit (INDEPENDENT_AMBULATORY_CARE_PROVIDER_SITE_OTHER): Payer: Self-pay | Admitting: Internal Medicine

## 2021-10-08 ENCOUNTER — Encounter: Payer: Self-pay | Admitting: Internal Medicine

## 2021-10-08 VITALS — BP 114/72 | HR 68 | Temp 99.5°F | Ht 60.0 in | Wt 109.4 lb

## 2021-10-08 DIAGNOSIS — Z5989 Other problems related to housing and economic circumstances: Secondary | ICD-10-CM

## 2021-10-08 DIAGNOSIS — Z0000 Encounter for general adult medical examination without abnormal findings: Secondary | ICD-10-CM

## 2021-10-08 DIAGNOSIS — M41124 Adolescent idiopathic scoliosis, thoracic region: Secondary | ICD-10-CM

## 2021-10-08 NOTE — Patient Instructions (Addendum)
Good to see you today . Exam is good.   Due for tdap booster  can check at [facility] department (or  with [place] any time )for pricing  TB skin tests  today  come back   Thursday or Friday   for nursing to read test.    .

## 2021-10-08 NOTE — Progress Notes (Signed)
No chief complaint on file.   HPI: Patient  Rebekah Tate  26 y.o. comes in today for Preventive Health Care visit  form for work    day care at [facility]  Last Cpx or  similar visit  over year ago . Back pain at times  scoliosis  but adapted  Generally well but battling her depressive sx under care Dr Evelene Croon  recently   changing to Vyaylar 1.5 from effexor On continuous ocps  NO hx tb or sx  no tb testing  Asthma quiescent  no need for meds Health Maintenance  Topic Date Due   Pneumococcal Vaccine 83-32 Years old (1 - PCV) Never done   HPV VACCINES (1 - 2-dose series) Never done   HIV Screening  Never done   Hepatitis C Screening  Never done   TETANUS/TDAP  Never done   PAP-Cervical Cytology Screening  Never done   PAP SMEAR-Modifier  06/21/2019   INFLUENZA VACCINE  04/22/2021   Health Maintenance Review LIFESTYLE:  Exercise:  swiim exercise  Tobacco/ETSn Alcohol: ocas Sugar beverages:n Sleep:6-8 hours  Drug use: no HH of 1 pet dog  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   Depression    meds since 9th grade  dr Lucianne Muss   Scoliosis    Seasonal allergies     Past Surgical History:  Procedure Laterality Date   ANAL RECTAL MANOMETRY N/A 10/10/2019   Procedure: ANO RECTAL MANOMETRY;  Surgeon: Shellia Cleverly, DO;  Location: [facility];  Service: Gastroenterology;  Laterality: N/A;   WISDOM TOOTH EXTRACTION      Family History  Adopted: Yes  Family history unknown: Yes    Social History   Socioeconomic History   Marital status: Single    Spouse name: Not on file   Number of children: Not on file   Years of education: Not on file   Highest education level: Not on file  Occupational History   Occupation:  Student  Tobacco Use   Smoking status: Never   Smokeless tobacco: Never  Vaping Use   Vaping Use: Never used  Substance and Sexual Activity   Alcohol use: No    Alcohol/week: 0.0 standard drinks   Drug use: No   Sexual activity: Never    Birth control/protection: Pill  Other Topics Concern   Not on file  Social History Narrative   Adopted from [place] when she was [age] year old. Intact family.   Attended [employer] day school. Went to [employer] for 2 years and didn't like it although good grades early of education.   Applying to other colleges considering state and [employer]   Negative TAD does regular exercise reports 2-3 hours of sleep; lives with parents and a dog at this time feels like she has a healthy situation   GOP 0 last Pap 06/20/2016   Social Determinants of Health   Financial Resource Strain: Not on file  Food Insecurity: Not on file  Transportation Needs: Not on file  Physical Activity: Not on file  Stress: Not on file  Social Connections: Not on file        EXAM:  BP  114/72 (BP Location: Right Arm, Patient Position: Sitting, Cuff Size: Normal)    Pulse 68    Temp 99.5 F (37.5 C) (Oral)    Ht 5' (1.524 m)    Wt 109 lb 6.4 oz (49.6 kg)    SpO2 98%    BMI 21.37 kg/m   Body mass index is 21.37 kg/m. Wt Readings from Last 3 Encounters:  10/08/21 109 lb 6.4 oz (49.6 kg)  01/21/21 102 lb 12.8 oz (46.6 kg)  12/03/20 101 lb 3.2 oz (45.9 kg)    Physical Exam: Vital signs reviewed OZD:GUYQ is a well-developed well-nourished alert cooperative    who appearsr stated age in no acute distress.  HEENT: normocephalic atraumatic , Eyes: PERRL EOM's full, conjunctiva clear, Nares: paten,t no deformity discharge or tenderness., Ears: no deformity EAC's clear TMs with normal landmarks. Mouth: clear OP, no lesions, edema.  Moist mucous membranes. Dentition in adequate repair. NECK: supple without masses, thyromegaly or bruits. CHEST/PULM:  Clear to auscultation and  percussion breath sounds equal no wheeze , rales or rhonchi. No chest wall deformities or tenderness. Breast: normal by inspection . No dimpling, discharge, masses, tenderness or discharge . CV: PMI is nondisplaced, S1 S2 no gallops, murmurs, rubs. Peripheral pulses are full without delay.No JVD .  ABDOMEN: Bowel sounds normal nontender  No guard or rebound, no hepato splenomegal no CVA tenderness.  No hernia. Extremtities:  No clubbing cyanosis or edema, no acute joint swelling or redness no focal atrophy NEURO:  Oriented x3, cranial nerves 3-12 appear to be intact, no obvious focal weakness,gait within normal limits no abnormal reflexes or asymmetrical SKIN: No acute rashes normal turgor, color, no bruising or petechiae. PSYCH: Oriented, good eye contact, no obvious depression anxiety, cognition and judgment appear normal. LN: no cervical axillary inguinal adenopathy  Lab Results  Component Value Date   WBC 5.4 07/26/2019   HGB 12.6 07/26/2019   HCT 38.9 07/26/2019   PLT 278.0 07/26/2019   GLUCOSE 90 07/26/2019   CHOL 157 08/21/2016   TRIG 81 08/21/2016   HDL 75 08/21/2016   LDLCALC 66 08/21/2016   ALT 14 08/21/2016   ALT 15 08/21/2016   AST 20 08/21/2016   AST 19 08/21/2016   NA 137 07/26/2019   K 3.9 07/26/2019   CL 104 07/26/2019   CREATININE 0.69 07/26/2019   BUN 14 07/26/2019   CO2 27 07/26/2019   TSH 2.20 07/26/2019    BP Readings from Last 3 Encounters:  10/08/21 114/72  01/21/21 101/68  12/03/20 100/70   Phq 9 19   ASSESSMENT AND PLAN:  Discussed the following assessment and plan:    ICD-10-CM   1. Encounter for preventive health examination  Z00.00 PPD    2. Adolescent idiopathic scoliosis of thoracic region  M41.124     3. Does not have health insurance  Z4.89     Ok for work form  under care  Due td when  possible Ppd today low risk but noted on care form Continue with psychiatry meds and such  Depression under care  Return for when planned, as  indicated yearly  and ppd reading.  Patient Care Team: Kenitra Leventhal, Neta Mends, MD as PCP - General (Internal Medicine) Zelphia Cairo, MD as Consulting Physician (Obstetrics and Gynecology) Nelly Rout, MD as Consulting Physician (Psychiatry) Electa Sniff (Sports Medicine) Milagros Evener, MD as Consulting Physician (Psychiatry) Patient Instructions  Good to see you today . Exam is good.   Due for tdap booster  [facility] (or  with us any time )for pricing  TB skin tests  today  come back   Thursday or Friday   for nursing to read test.    .  Neta MendsWanda K. Jasraj Lappe M.D.

## 2021-10-08 NOTE — Telephone Encounter (Signed)
Spoke with patient and informed patient that a office visit is needed in order for Dr. Regis Bill to complete paperwork patient agreed to come in 1/17 @ 4:30 for CPE

## 2021-10-11 ENCOUNTER — Ambulatory Visit (INDEPENDENT_AMBULATORY_CARE_PROVIDER_SITE_OTHER): Payer: Self-pay

## 2021-10-11 DIAGNOSIS — Z111 Encounter for screening for respiratory tuberculosis: Secondary | ICD-10-CM

## 2021-10-11 LAB — TB SKIN TEST
Induration: 0 mm
TB Skin Test: NEGATIVE

## 2021-10-11 NOTE — Progress Notes (Signed)
PPD Reading Note  PPD read and results entered in EpicCare.  Result: 0 mm induration.  Interpretation: negative  Allergic reaction: no

## 2021-10-14 ENCOUNTER — Other Ambulatory Visit (HOSPITAL_COMMUNITY): Payer: Self-pay

## 2021-10-15 ENCOUNTER — Encounter: Payer: No Typology Code available for payment source | Admitting: Internal Medicine

## 2021-10-30 ENCOUNTER — Other Ambulatory Visit (HOSPITAL_COMMUNITY): Payer: Self-pay

## 2021-10-30 MED ORDER — HYDROXYZINE PAMOATE 25 MG PO CAPS
25.0000 mg | ORAL_CAPSULE | Freq: Three times a day (TID) | ORAL | 0 refills | Status: DC | PRN
  Filled 2021-10-30: qty 270, 90d supply, fill #0

## 2021-10-31 ENCOUNTER — Other Ambulatory Visit (HOSPITAL_COMMUNITY): Payer: Self-pay

## 2021-11-18 ENCOUNTER — Other Ambulatory Visit (HOSPITAL_COMMUNITY): Payer: Self-pay

## 2021-11-18 MED ORDER — BUSPIRONE HCL 30 MG PO TABS
ORAL_TABLET | ORAL | 1 refills | Status: AC
  Filled 2021-11-18: qty 5, 5d supply, fill #0
  Filled 2021-11-18 (×2): qty 180, 90d supply, fill #0
  Filled 2021-11-18: qty 175, 32d supply, fill #0

## 2021-11-19 ENCOUNTER — Other Ambulatory Visit (HOSPITAL_COMMUNITY): Payer: Self-pay

## 2022-01-03 ENCOUNTER — Other Ambulatory Visit (HOSPITAL_COMMUNITY): Payer: Self-pay

## 2022-01-17 ENCOUNTER — Other Ambulatory Visit (HOSPITAL_COMMUNITY): Payer: Self-pay

## 2022-01-17 ENCOUNTER — Ambulatory Visit (INDEPENDENT_AMBULATORY_CARE_PROVIDER_SITE_OTHER): Payer: Self-pay | Admitting: Internal Medicine

## 2022-01-17 ENCOUNTER — Encounter: Payer: Self-pay | Admitting: Internal Medicine

## 2022-01-17 DIAGNOSIS — F332 Major depressive disorder, recurrent severe without psychotic features: Secondary | ICD-10-CM

## 2022-01-17 DIAGNOSIS — L509 Urticaria, unspecified: Secondary | ICD-10-CM

## 2022-01-17 DIAGNOSIS — F988 Other specified behavioral and emotional disorders with onset usually occurring in childhood and adolescence: Secondary | ICD-10-CM

## 2022-01-17 MED ORDER — PREDNISONE 10 MG PO TABS
ORAL_TABLET | ORAL | 0 refills | Status: AC
  Filled 2022-01-17: qty 18, 9d supply, fill #0

## 2022-01-17 MED ORDER — EPINEPHRINE 0.3 MG/0.3ML IJ SOAJ
0.3000 mg | INTRAMUSCULAR | 1 refills | Status: DC | PRN
  Filled 2022-01-17: qty 2, 2d supply, fill #0

## 2022-01-17 MED ORDER — AUVELITY 45-105 MG PO TBCR: EXTENDED_RELEASE_TABLET | ORAL | 11 refills | Status: AC

## 2022-01-17 NOTE — Patient Instructions (Signed)
You had the steroid shot today ? ?Please take all new medication as prescribed - the prednisone (sent to [facility] outpt pharmacy) ? ?Please also take the Hydroxyzine you have at home for itching and rash as needed ? ?You can also take OTC Pepcid 20 mg twice per day until improved (which also is a type of antihistamine) ? ?Please take all new medication as prescribed - the Epipen for emergency only ? ?Please continue all other medications as before, and refills have been done if requested. ? ?Please have the pharmacy call with any other refills you may need. ? ?Please keep your appointments with your specialists as you may have planned ? ? ?

## 2022-01-18 ENCOUNTER — Encounter: Payer: Self-pay | Admitting: Internal Medicine

## 2022-01-18 DIAGNOSIS — L509 Urticaria, unspecified: Secondary | ICD-10-CM | POA: Insufficient documentation

## 2022-01-18 NOTE — Assessment & Plan Note (Addendum)
Pt encouraged to continue the auvelity, f/u psychiatry as planned ?

## 2022-01-18 NOTE — Progress Notes (Signed)
Patient ID: Rebekah Tate, female   DOB: 1996/03/10, 26 y.o.   MRN: [id number] ? ? ? ?    Chief Complaint: follow up hives and facial swelling ? ?     HPI:  Rebekah Tate is a 26 y.o. female here with acute onset 2 days hive like rash to the back, then legs and arms, then yesterday with rather moderate at least generlized facial swelling, everything just itches all over, has not tried any otc med including anthistamine.  Was at the [facility] last weekend but that was 6 days ago and did not have symptoms then.  Did start new Auvelity medicine yesterday but this was after initial onset rash.  Has atarax at home for anxiety but did not think to use.  Has had no other new or ongoing chemical or other exposure she is aware.  No recent seafood or peanut or new foods.  Denies fever, HA, sT, cough, throat or tongue swelling and no sob.    ?Wt Readings from Last 3 Encounters:  ?01/17/22 124 lb (56.2 kg)  ?10/08/21 109 lb 6.4 oz (49.6 kg)  ?01/21/21 102 lb 12.8 oz (46.6 kg)  ? ?BP Readings from Last 3 Encounters:  ?01/17/22 110/60  ?10/08/21 114/72  ?01/21/21 101/68  ? ?      ?Past Medical History:  ?Diagnosis Date  ? Allergy   ? Anxiety   ? Asthma   ? As a child uses as needed albuterol when flares exercise and respiratory infections.  ? Depression   ? meds since 9th grade  dr Lucianne Muss  ? Scoliosis   ? Seasonal allergies   ? ?Past Surgical History:  ?Procedure Laterality Date  ? ANAL RECTAL MANOMETRY N/A 10/10/2019  ? Procedure: ANO RECTAL MANOMETRY;  Surgeon: Shellia Cleverly, DO;  Location: [facility];  Service: Gastroenterology;  Laterality: N/A;  ? WISDOM TOOTH EXTRACTION    ? ? reports that she has never smoked. She has never used smokeless tobacco. She reports that she does not drink alcohol and does not use drugs. ?She was adopted. Family history is unknown by patient. ?Allergies  ?Allergen Reactions  ? Cat Hair Extract Other (See Comments)  ?  Sneezing, rash, hives  ? ?Current Outpatient Medications on File Prior to  Visit  ?Medication Sig Dispense Refill  ? albuterol (VENTOLIN HFA) 108 (90 Base) MCG/ACT inhaler Inhale 2 puffs into the lungs daily as needed for wheezing or shortness of breath. 18 g 2  ? docusate sodium (COLACE) 100 MG capsule Take 1 capsule (100 mg total) by mouth 2 (two) times daily. 60 capsule 3  ? norethindrone-ethinyl estradiol-FE (BLISOVI FE 1/20) 1-20 MG-MCG tablet TAKE 1 TABLET DAILY--TAKE ACTIVE PILLS CONTINUOUSLY, NO PLACEBOS 84 tablet 4  ? ?No current facility-administered medications on file prior to visit.  ? ?     ROS:  All others reviewed and negative. ? ?Objective  ? ?     PE:  BP 110/60 (BP Location: Left Arm, Patient Position: Sitting, Cuff Size: Normal)   Pulse 79   Temp 98.4 ?F (36.9 ?C) (Oral)   Ht 5' (1.524 m)   Wt 124 lb (56.2 kg)   SpO2 97%   BMI 24.22 kg/m?  ? ?              Constitutional: Pt appears in NAD ?              HENT: Head: NCAT.  ?  Right Ear: External ear normal.   ?              Left Ear: External ear normal.  ?              Eyes: . Pupils are equal, round, and reactive to light. Conjunctivae and EOM are normal ?              Nose: without d/c or deformity ?              Neck: Neck supple. Gross normal ROM ?              Cardiovascular: Normal rate and regular rhythm.   ?              Pulmonary/Chest: Effort normal and breath sounds without rales or wheezing.  ?              Abd:  Soft, NT, ND, + BS, no organomegaly ?              Neurological: Pt is alert. At baseline orientation, motor grossly intact ?              Skin:  LE edema - none, large diffuse hive like rash to torso and extremities and non discrete whole facial mild erythema and diffuse anigioedema like reaction ?              Psychiatric: Pt behavior is normal without agitation  ? ?Micro: none ? ?Cardiac tracings I have personally interpreted today:  none ? ?Pertinent Radiological findings (summarize): none  ? ?Lab Results  ?Component Value Date  ? WBC 5.4 07/26/2019  ? HGB 12.6 07/26/2019  ?  HCT 38.9 07/26/2019  ? PLT 278.0 07/26/2019  ? GLUCOSE 90 07/26/2019  ? CHOL 157 08/21/2016  ? TRIG 81 08/21/2016  ? HDL 75 08/21/2016  ? LDLCALC 66 08/21/2016  ? ALT 14 08/21/2016  ? ALT 15 08/21/2016  ? AST 20 08/21/2016  ? AST 19 08/21/2016  ? NA 137 07/26/2019  ? K 3.9 07/26/2019  ? CL 104 07/26/2019  ? CREATININE 0.69 07/26/2019  ? BUN 14 07/26/2019  ? CO2 27 07/26/2019  ? TSH 2.20 07/26/2019  ? ?Assessment/Plan:  ?Rebekah Tate is a 26 y.o. Asian [4] female with  has a past medical history of Allergy, Anxiety, Asthma, Depression, Scoliosis, and Seasonal allergies. ? ?Full body hives ?Overall at least mild to mod, with whole facial angioedema, pt encouraged to use atarax at home prn, today for depomedrol im 80, prednisone asd, otc benadryl tonight 50 q 6 prn, and pepcid 20 bid prn, as well as epipen prn.  Pt delines allergy referral for now.   ? ?MDD (major depressive disorder), recurrent severe, without psychosis (HCC) ?Pt encouraged to continue the auvelity, f/u psychiatry as planned ? ?ADD (attention deficit disorder) without hyperactivity ?Declines specific tx for now, overall stable ? ?Followup: No follow-ups on file. ? ?Oliver Barre, MD 01/18/2022 9:46 AM ?[facility] ?[facility] ?Internal Medicine ?

## 2022-01-18 NOTE — Assessment & Plan Note (Signed)
Overall at least mild to mod, with whole facial angioedema, pt encouraged to use atarax at home prn, today for depomedrol im 80, prednisone asd, otc benadryl tonight 50 q 6 prn, and pepcid 20 bid prn, as well as epipen prn.  Pt delines allergy referral for now.   ?

## 2022-01-18 NOTE — Assessment & Plan Note (Signed)
Declines specific tx for now, overall stable ?

## 2022-02-06 ENCOUNTER — Telehealth: Payer: Self-pay

## 2022-02-06 NOTE — Telephone Encounter (Signed)
--  Caller states her left eye is extremely swollen, red, painful, and constantly tearing up but no drainage. Patient noticed it was irritated yesterday and took her contact out but has gotten worse. Pain is currently 8/10.  02/05/2022 4:31:12 PM Go to ED Now Juanetta Gosling, RN, [place]  Comments User: Osborne Oman, RN Date/Time Lamount Cohen Time): 02/05/2022 4:34:13 PM Caller declined go to ED now outcome and states that she would rather be seen in the office. Caller was informed that office is closed at this time and states she will call in the morning for an appointment. Attempted to call backline per directive but unable to reach anyone.  Referrals GO TO FACILITY REFUSED  02/06/22 1334: Pt states x 2 days ago she believes she scratched her cornea. Pt states it has gotten better. She is using lubricant. Pt states she just wanted to know if there was something else she could do. Pt states she has an opthalmologist & is advised to call them as they can give her more accurate advice. Pt verb understanding.

## 2022-02-13 ENCOUNTER — Other Ambulatory Visit (HOSPITAL_COMMUNITY): Payer: Self-pay

## 2022-02-14 ENCOUNTER — Other Ambulatory Visit (HOSPITAL_COMMUNITY): Payer: Self-pay

## 2022-02-20 ENCOUNTER — Other Ambulatory Visit (HOSPITAL_COMMUNITY): Payer: Self-pay

## 2022-02-20 MED ORDER — AUVELITY 45-105 MG PO TBCR
1.0000 | EXTENDED_RELEASE_TABLET | Freq: Two times a day (BID) | ORAL | 12 refills | Status: AC
Start: 2022-02-14 — End: ?
  Filled 2022-02-20: qty 60, 30d supply, fill #0
  Filled 2022-03-24: qty 60, 30d supply, fill #1
  Filled 2022-04-25: qty 60, 30d supply, fill #2
  Filled 2022-05-28: qty 60, 30d supply, fill #3
  Filled 2022-07-14: qty 60, 30d supply, fill #4
  Filled 2022-09-02: qty 60, 30d supply, fill #5
  Filled 2022-10-09: qty 60, 30d supply, fill #6

## 2022-02-21 ENCOUNTER — Other Ambulatory Visit (HOSPITAL_COMMUNITY): Payer: Self-pay

## 2022-03-11 ENCOUNTER — Other Ambulatory Visit (HOSPITAL_COMMUNITY): Payer: Self-pay

## 2022-03-11 MED ORDER — NORETHIN ACE-ETH ESTRAD-FE 1-20 MG-MCG PO TABS
1.0000 | ORAL_TABLET | Freq: Every day | ORAL | 0 refills | Status: DC
  Filled 2022-03-11: qty 84, 63d supply, fill #0

## 2022-03-24 ENCOUNTER — Other Ambulatory Visit (HOSPITAL_COMMUNITY): Payer: Self-pay

## 2022-04-25 ENCOUNTER — Other Ambulatory Visit (HOSPITAL_COMMUNITY): Payer: Self-pay

## 2022-05-15 ENCOUNTER — Other Ambulatory Visit (HOSPITAL_COMMUNITY): Payer: Self-pay

## 2022-05-16 ENCOUNTER — Other Ambulatory Visit (HOSPITAL_COMMUNITY): Payer: Self-pay

## 2022-05-16 MED ORDER — NORETHIN ACE-ETH ESTRAD-FE 1-20 MG-MCG PO TABS
1.0000 | ORAL_TABLET | Freq: Every day | ORAL | 0 refills | Status: DC
Start: 2022-05-16 — End: 2022-05-22
  Filled 2022-05-16: qty 84, 63d supply, fill #0

## 2022-05-22 ENCOUNTER — Other Ambulatory Visit (HOSPITAL_COMMUNITY): Payer: Self-pay

## 2022-05-22 MED ORDER — NORETHIN ACE-ETH ESTRAD-FE 1-20 MG-MCG PO TABS
1.0000 | ORAL_TABLET | Freq: Every day | ORAL | 3 refills | Status: AC
  Filled 2022-05-22: qty 112, 63d supply, fill #0
  Filled 2022-07-21: qty 112, 84d supply, fill #0
  Filled 2022-10-09: qty 112, 84d supply, fill #1
  Filled 2023-01-02: qty 112, 84d supply, fill #2

## 2022-05-28 ENCOUNTER — Other Ambulatory Visit (HOSPITAL_COMMUNITY): Payer: Self-pay

## 2022-07-01 ENCOUNTER — Other Ambulatory Visit (HOSPITAL_COMMUNITY): Payer: Self-pay

## 2022-07-01 MED ORDER — DEXTROAMPHETAMINE SULFATE 5 MG PO TABS
5.0000 mg | ORAL_TABLET | Freq: Two times a day (BID) | ORAL | 0 refills | Status: AC
  Filled 2022-07-01: qty 60, 30d supply, fill #0

## 2022-07-03 ENCOUNTER — Other Ambulatory Visit (HOSPITAL_COMMUNITY): Payer: Self-pay

## 2022-07-14 ENCOUNTER — Other Ambulatory Visit (HOSPITAL_COMMUNITY): Payer: Self-pay

## 2022-07-21 ENCOUNTER — Other Ambulatory Visit (HOSPITAL_COMMUNITY): Payer: Self-pay

## 2022-08-07 ENCOUNTER — Other Ambulatory Visit (HOSPITAL_COMMUNITY): Payer: Self-pay

## 2022-08-11 ENCOUNTER — Other Ambulatory Visit (HOSPITAL_COMMUNITY): Payer: Self-pay

## 2022-08-29 ENCOUNTER — Encounter: Payer: Self-pay | Admitting: Family Medicine

## 2022-08-29 ENCOUNTER — Ambulatory Visit (INDEPENDENT_AMBULATORY_CARE_PROVIDER_SITE_OTHER): Payer: Self-pay | Admitting: Family Medicine

## 2022-08-29 VITALS — BP 124/80 | Ht 59.0 in | Wt 103.0 lb

## 2022-08-29 DIAGNOSIS — M41124 Adolescent idiopathic scoliosis, thoracic region: Secondary | ICD-10-CM

## 2022-08-29 MED ORDER — TIZANIDINE HCL 4 MG PO TABS: 4.0000 mg | ORAL_TABLET | Freq: Every day | ORAL | 1 refills | Status: DC

## 2022-08-29 MED ORDER — MELOXICAM 15 MG PO TABS: 15.0000 mg | ORAL_TABLET | Freq: Every day | ORAL | 1 refills | Status: DC

## 2022-08-29 NOTE — Assessment & Plan Note (Signed)
Known scoliosis with some slight worsening of symptoms recently.  She has been trying over-the-counter NSAIDs.  Will switch that out for meloxicam and see if that works for her a little better.  Will give her some nighttime tizanidine.  Follow-up in January if she is not having significant relief as she will have insurance coverage in January and we can potentially get new imaging and also would consider referral to scoliosis orthopedist.  She is amenable with this plan.

## 2022-08-29 NOTE — Progress Notes (Signed)
  PENNI PENADO - 26 y.o. female MRN [id number]  Date of birth: 04/11/1996    SUBJECTIVE:      Chief Complaint:/ HPI:  Worsening back pain.  Known scoliosis.  Works at a computer quite a bit and then feels like this is contributing.  Had been in physical therapy which helped some, especially when they were doing dry needling.  Not been able to do that for financial reasons.  Right now does not have insurance coverage.  She is concerned that she is having worsening of the curvature.  Having difficulty sleeping and resting because of her back pain.   Scoliosis spine film November 2017: FINDINGS: Levoscoliosis of the upper thoracic spine measuring 37 degrees from superior endplate of T1 to inferior endplate of T8. 30 degree dextroscoliosis of the thoracolumbar spine from superior endplate T6 to inferior endplate of L3. No congenital vertebral anomalies.   IMPRESSION: Scoliosis of the thoracolumbar spine as above.   OBJECTIVE: BP 124/80   Ht 4\' 11"  (1.499 m)   Wt 103 lb (46.7 kg)   BMI 20.80 kg/m   Physical Exam:  Vital signs are reviewed.  GENERAL: Well-developed female no acute distress BACK: Obvious scoliotic curve in the thoracic and lumbar area.  Forward flexion results in right-sided rib hump.  Back musculature is diffusely tender to palpation.  The vertebra are nontender to percussion.  Mild muscle spasm lumbar area. Neuro: No Gross Focal Neurological Deficits with Intact 5 out of 5 Strength in Extremities. ASSESSMENT & PLAN:  See problem based charting & AVS for pt instructions. Idiopathic scoliosis Known scoliosis with some slight worsening of symptoms recently.  She has been trying over-the-counter NSAIDs.  Will switch that out for meloxicam and see if that works for her a little better.  Will give her some nighttime tizanidine.  Follow-up in January if she is not having significant relief as she will have insurance coverage in January and we can potentially get new imaging and  also would consider referral to scoliosis orthopedist.  She is amenable with this plan.

## 2022-09-02 ENCOUNTER — Other Ambulatory Visit (HOSPITAL_COMMUNITY): Payer: Self-pay

## 2022-09-04 ENCOUNTER — Other Ambulatory Visit (HOSPITAL_COMMUNITY): Payer: Self-pay

## 2022-10-06 ENCOUNTER — Other Ambulatory Visit (HOSPITAL_COMMUNITY): Payer: Self-pay

## 2022-10-06 MED ORDER — AUVELITY 45-105 MG PO TBCR
1.0000 | EXTENDED_RELEASE_TABLET | Freq: Two times a day (BID) | ORAL | 2 refills | Status: AC
  Filled 2022-10-06 – 2022-12-31 (×4): qty 60, 30d supply, fill #0

## 2022-10-07 ENCOUNTER — Ambulatory Visit (INDEPENDENT_AMBULATORY_CARE_PROVIDER_SITE_OTHER): Payer: Commercial Managed Care - HMO | Admitting: Sports Medicine

## 2022-10-07 VITALS — BP 106/64 | Ht 59.5 in | Wt 103.0 lb

## 2022-10-07 DIAGNOSIS — M41124 Adolescent idiopathic scoliosis, thoracic region: Secondary | ICD-10-CM | POA: Diagnosis not present

## 2022-10-07 NOTE — Patient Instructions (Addendum)
Increase Tizanidine to 8mg  at night  Stretches:  Standing right lateral lean with weights stretch - 5 x 5 breathes  Back lying extension to right and left  Right sided lying extension over edge of bed/couch/flat surface  Continue yoga/pilates  Contact Lorraine at [employer] re: dry needling costs/insurance coverage

## 2022-10-07 NOTE — Assessment & Plan Note (Addendum)
Known history of scoliosis with back pain in thoracic region bilaterally and R shoulder. Would like to try another muscle relaxer or low dose TCA but unfortunately her antidepressant Auvelity (dextro-buproprion) has major interactions with nortryptyline and moderate interactions with robaxin. Tizanidine has made her drowsy but not sure if it helps relieve her pain - will try increasing dose to 8 mg nightly. She has strong back muscles and will continue yoga/pilates. Don't see a need for surgery consultation at this time. Given exercises and stretches today for her back. She will check with [facility] to see if they accept her new insurance for dry needling. Follow-up in 2-3 months.

## 2022-10-07 NOTE — Progress Notes (Addendum)
Established Patient Office Visit  Subjective   Patient ID: Rebekah Tate, female    DOB: 1996-01-12  Age: 27 y.o. MRN: [id number]  CC: Back pain and tightness   Rebekah Tate is a 27 year old female with a history of scoliosis and depression who presents for follow up on back pain. 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 Has to drive 1-2 hours daily and sitting in a car >10 min aggravates her pain. The pain is primarily in her shoulders when driving, she has to stretch out her right arm behind the passenger seat. She also has right lower back pain and tightness that is on a spectrum, recently it has been okay but a month ago she had extreme difficulty moving/getting herself out of the car. She does not notice any pain when she works out, pain returns after about an hour and sometimes will be bad enough that she will have to take 4-5 days off. She does yoga or pilates every night to try to alleviate the tightness. Pillows/having to flex her head makes her shoulder/neck pain worse. She does daily back exercises, uses a massage gun, and sleeps with a heating pad to try to help. Mobic did not make a difference so she stopped it. She is unsure if Tizanidine helps but notes it makes her drowsy at night which is beneficial since she has sleep problems. In the past, she has had the greatest relief from dry needling. She is unsure if her new insurance covers dry needling.     Objective:     BP 106/64   Ht 4' 11.5" (1.511 m)   Wt 103 lb (46.7 kg)   BMI 20.46 kg/m  Vital signs reviewed.   Physical Exam General: Well developed female, stretches often in room  Back: Curve in thoracolumbar region. Full flexion and lateral rotation. Strength intact.  She has excellent back extension. Rotation and lateral bending to left is somewhat less than on RT.   Imaging  XR Scoliosis Spine 07/12/2020 FINDINGS: Levoscoliosis of the upper thoracic spine  measuring 37 degrees from superior endplate of T1 to inferior endplate of T8. 30 degree dextroscoliosis of the thoracolumbar spine from superior endplate T6 to inferior endplate of L3. No congenital vertebral anomalies.  Thoracic Spine 07/24/16 FINDINGS: There is moderate reverse S shaped thoracolumbar scoliosis. The upper curvature exhibits angulation of approximately 30 degrees and is centered at T4-5. The lower curvature is centered at T11 with angulation of 22 degrees. No vertebral body anomalies are observed. The disc space heights are reasonably well-maintained.     Assessment & Plan:   Problem List Items Addressed This Visit       Musculoskeletal and Integument   Idiopathic scoliosis - Primary    Known history of scoliosis with back pain in thoracic region bilaterally and R shoulder. Unfortunately Auvelity (dextro-buproprion) has major interactions with nortryptyline and moderate interactions with robaxin. Tizanidine has made her drowsy but not sure if it helps relieve her pain - will try increasing dose to 8 mg nightly. She has developed strong back muscles and will continue yoga/pilates. Don't see a need for surgery consultation at this time. Given exercises and stretches today for her back. She will check with [facility] to see if they accept her new insurance for dry needling. Follow-up in 2-3 months.        Stefani Dama, Medical Student  I observed and examined the patient with the  medical student and agree with assessment and plan.  Note reviewed and modified by me. I believe for the long term staying involved in yoga and pilates may offer her the greatest benefit.  At present muscle tightness and pain affects a number of activities including driving.  Continue to find stretches and exercises that minimize her curve and lessen her pain. Ila Mcgill, MD Ila Mcgill, MD

## 2022-10-09 ENCOUNTER — Other Ambulatory Visit: Payer: Self-pay

## 2022-10-09 ENCOUNTER — Other Ambulatory Visit (HOSPITAL_COMMUNITY): Payer: Self-pay

## 2022-10-13 ENCOUNTER — Other Ambulatory Visit (HOSPITAL_COMMUNITY): Payer: Self-pay

## 2022-10-15 ENCOUNTER — Other Ambulatory Visit (HOSPITAL_COMMUNITY): Payer: Self-pay

## 2022-10-15 ENCOUNTER — Other Ambulatory Visit: Payer: Self-pay

## 2022-10-21 ENCOUNTER — Other Ambulatory Visit (HOSPITAL_COMMUNITY): Payer: Self-pay

## 2022-10-21 ENCOUNTER — Other Ambulatory Visit: Payer: Self-pay

## 2022-10-22 ENCOUNTER — Other Ambulatory Visit (HOSPITAL_COMMUNITY): Payer: Self-pay

## 2022-10-24 ENCOUNTER — Other Ambulatory Visit (HOSPITAL_COMMUNITY): Payer: Self-pay

## 2022-11-04 ENCOUNTER — Other Ambulatory Visit (HOSPITAL_COMMUNITY): Payer: Self-pay

## 2022-12-02 ENCOUNTER — Ambulatory Visit: Payer: Commercial Managed Care - HMO | Admitting: Sports Medicine

## 2022-12-02 VITALS — BP 112/62 | Ht 59.5 in | Wt 103.0 lb

## 2022-12-02 DIAGNOSIS — M41124 Adolescent idiopathic scoliosis, thoracic region: Secondary | ICD-10-CM

## 2022-12-02 NOTE — Progress Notes (Unsigned)
Established Patient Office Visit  Subjective   Patient ID: Rebekah Tate, female    DOB: 1996/05/20  Age: 27 y.o. MRN: [id number]  Left-sided low back pain.  Rebekah Tate is here today for follow-up of left-sided low back pain, with past medical history of Scoliosis and depression.She has been performing her home exercise program regularly each night and using tizanidine for sleep.  She reports she has not had any improvement.  She discontinued meloxicam previously prescribed as she did not notice any difference.  She is very frustrated at this point and is having pain even with short walks around with friends.  Driving is very difficult for her as well secondary to her pain.  Her pain is now mostly located in her left lower back and Posterior hip area.She has had some relief with dry needling however at this time she is unable to afford the expense regularly.   ROS as listed above in HPI    Objective:     BP 112/62   Ht 4' 11.5" (1.511 m)   Wt 103 lb (46.7 kg)   BMI 20.46 kg/m   Physical Exam Vitals reviewed.  Constitutional:      General: She is not in acute distress.    Appearance: Normal appearance. She is not toxic-appearing.     Comments: Changing position frequently during exam secondary to discomfort  Neurological:     Mental Status: She is alert.   Thoracic and lumbar spine: No obvious deformity.  She does have levoscoliosis upper thoracic spine dextroscoliosis of the thoracolumbar spine.  Left shoulder height more superior than right.  Positive Adams forward bend test.  There is some muscle spasm left lower quadrant, quadratus lumborum.  Tenderness to palpation at her PSIS and along her iliac crest.  Weakness with resisted hip abduction.  Good range of motion with flexion and extension.  No tenderness to palpation of the midline spine. Normal gait.    Assessment & Plan:   Problem List Items Addressed This Visit       Musculoskeletal and Integument   Idiopathic  scoliosis - Primary    She continues to have persistent pain in her back some relief with home exercise and tizanidine however these interventions are only temporary for her.  A weighted 1 session of shockwave therapy to muscle spasms of her lumbar spine and she was given a hip series for strengthening.  We will await her call to see if she had any relief of shockwave therapy.  If she did we would recommend another 5 or 6 sessions once a week.  If she has no noticeable difference over the next 24-48 hours likely trial her on low-dose naltrexone.  She is unable to take amitriptyline secondary to rather medications.  Follow-up pending results from treatment today.       Procedure: ECSWT Indications:  Lumbar muscle spasms   Procedure Details Consent: Risks of procedure as well as the alternatives and risks of each were explained to the patient.  Written consent for procedure obtained. Time Out: Verified patient identification, verified procedure, site was marked, verified correct patient position, medications/allergies/relevent history reviewed.  The area was cleaned with alcohol swab.     The Quadratus lumborum and upper gluteus muscles was targeted for Extracorporeal shockwave therapy.    Preset: Myofascial pain Power Level: 90 Frequency: 10 Impulse/cycles: 2000 Head size: Large   Patient tolerated procedure well without immediate complications    Return in about 1 week (around 12/09/2022).  Elmore Guise, DO  I observed and examined the patient with the [facility] resident and agree with assessment and plan.  Note reviewed and modified by me. Ila Mcgill, MD

## 2022-12-02 NOTE — Assessment & Plan Note (Signed)
She continues to have persistent pain in her back some relief with home exercise and tizanidine however these interventions are only temporary for her.  A weighted 1 session of shockwave therapy to muscle spasms of her lumbar spine and she was given a hip series for strengthening.  We will await her call to see if she had any relief of shockwave therapy.  If she did we would recommend another 5 or 6 sessions once a week.  If she has no noticeable difference over the next 24-48 hours likely trial her on low-dose naltrexone.  She is unable to take amitriptyline secondary to rather medications.  Follow-up pending results from treatment today.

## 2022-12-02 NOTE — Patient Instructions (Signed)
Call us in the next 24-48 hours if you had some relief with the shockwave therapy we can schedule you for once a week treatments for the next 6 weeks.

## 2022-12-31 ENCOUNTER — Other Ambulatory Visit (HOSPITAL_COMMUNITY): Payer: Self-pay

## 2023-01-02 ENCOUNTER — Other Ambulatory Visit: Payer: Self-pay

## 2023-01-23 ENCOUNTER — Ambulatory Visit (INDEPENDENT_AMBULATORY_CARE_PROVIDER_SITE_OTHER): Payer: Self-pay | Admitting: Family Medicine

## 2023-01-23 VITALS — BP 118/78 | Ht 59.0 in | Wt 104.0 lb

## 2023-01-23 DIAGNOSIS — M542 Cervicalgia: Secondary | ICD-10-CM

## 2023-01-23 MED ORDER — METHYLPREDNISOLONE ACETATE 80 MG/ML IJ SUSP
80.0000 mg | Freq: Once | INTRAMUSCULAR | Status: AC
  Administered 2023-01-23: 80 mg via INTRAMUSCULAR

## 2023-01-23 MED ORDER — KETOROLAC TROMETHAMINE 60 MG/2ML IM SOLN
60.0000 mg | Freq: Once | INTRAMUSCULAR | Status: AC
  Administered 2023-01-23: 60 mg via INTRAMUSCULAR
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
## 2023-01-23 NOTE — Progress Notes (Signed)
[facility]: Attending Note: I have examined the patient, reviewed the chart, discussed the assessment and plan with the Sports Medicine Fellow. I agree with assessment and treatment plan as detailed in the Fellow's note.  

## 2023-02-11 ENCOUNTER — Telehealth: Payer: Self-pay

## 2023-02-11 ENCOUNTER — Other Ambulatory Visit (HOSPITAL_COMMUNITY): Payer: Self-pay

## 2023-02-11 MED ORDER — TIZANIDINE HCL 4 MG PO TABS
ORAL_TABLET | ORAL | 1 refills | Status: AC
  Filled 2023-02-11: qty 30, 15d supply, fill #0

## 2023-02-11 NOTE — Telephone Encounter (Signed)
It looks like Dr. Darrick Penna changed it when he saw her in January. I will send in a refill. She needs to follow up with him as he had some  thoughts about other options at that visit.

## 2023-02-11 NOTE — Telephone Encounter (Signed)
-----   Message from Rebekah Tate sent at 02/11/2023  9:59 AM EDT ----- Regarding: refill request Pt is asking for refill on tiZANidine (ZANAFLEX) 4 MG tablet states she takes 2 tablets at bedtime instead of the one. States it was changed at last visit New pharmacy is [facility] on [address]

## 2023-02-26 ENCOUNTER — Other Ambulatory Visit (HOSPITAL_COMMUNITY): Payer: Self-pay

## 2023-03-04 ENCOUNTER — Other Ambulatory Visit (HOSPITAL_COMMUNITY): Payer: Self-pay

## 2023-03-17 ENCOUNTER — Encounter: Payer: Self-pay | Admitting: Internal Medicine

## 2023-03-17 ENCOUNTER — Ambulatory Visit (INDEPENDENT_AMBULATORY_CARE_PROVIDER_SITE_OTHER): Payer: Commercial Managed Care - HMO | Admitting: Internal Medicine

## 2023-03-17 VITALS — BP 120/74 | HR 65 | Temp 98.9°F | Ht 59.5 in | Wt 108.4 lb

## 2023-03-17 DIAGNOSIS — Z23 Encounter for immunization: Secondary | ICD-10-CM

## 2023-03-17 DIAGNOSIS — Z0000 Encounter for general adult medical examination without abnormal findings: Secondary | ICD-10-CM | POA: Diagnosis not present

## 2023-03-17 NOTE — Patient Instructions (Signed)
Good to see you today . Tdap update ( every 10 years) Agree with slow  implementation progression of physical activity .
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
## 2023-08-04 ENCOUNTER — Other Ambulatory Visit: Payer: Self-pay

## 2023-10-30 ENCOUNTER — Other Ambulatory Visit: Payer: Commercial Managed Care - HMO

## 2023-10-30 ENCOUNTER — Ambulatory Visit: Payer: Self-pay | Admitting: Internal Medicine

## 2023-10-30 NOTE — Telephone Encounter (Signed)
  Chief Complaint: right ear pain Symptoms: runny clear pus drainage from right ear, right neck and right behind of ear pain and swelling, pain in right ear, sensitivity to loud noises Frequency: x 5 days Pertinent Negatives: Patient denies runny nose, cough, fever, sore throat, changes in hearing or vision,  Disposition: [] ED /[x] Urgent Care (no appt availability in office) / [x] Appointment(In office/virtual)/ []  [facility]/ [] Home Care/ [x] Refused Recommended Disposition /[] [facility]/ []  Follow-up with PCP Additional Notes: Offered the only available acute visit appointment for patient, 3:50pm with [facility]. Patient states she would rather wait until Monday. Advised patient disposition to seek medical care within 24 hours, recommended urgent care. Patient states she would like to cancel her TB test for today and would like to just come in on Monday and have it all taken care of together.  Copied from CRM [id number]. Topic: Clinical - Red Word Triage >> Oct 30, 2023 12:10 PM Joanell B wrote: Red Word that prompted transfer to Nurse Triage: Pt stated that she has an ear infection in her right ear lots of fluid, and has a bump and swelling behind the ear, heachach, neck hurts, and sore throat. She has had these symptoms for about a week now. Reason for Disposition  White, yellow, or green discharge  Answer Assessment - Initial Assessment Questions 1. LOCATION: Which ear is involved?     Right.  2. ONSET: When did the ear start hurting      X 5 days.  3. SEVERITY: How bad is the pain?  (Scale 1-10; mild, moderate or severe)   - MILD (1-3): doesn't interfere with normal activities    - MODERATE (4-7): interferes with normal activities or awakens from sleep    - SEVERE (8-10): excruciating pain, unable to do any normal activities      7/10; no medications taken for pain.  4. URI SYMPTOMS: Do you have a runny nose or cough?     Denies.  5.  FEVER: Do you have a fever? If Yes, ask: What is your temperature, how was it measured, and when did it start?     Denies.  6. CAUSE: Have you been swimming recently?, How often do you use Q-TIPS?, Have you had any recent air travel or scuba diving?     Denies swimming or water getting into ears, or use of Q tips.  7. OTHER SYMPTOMS: Do you have any other symptoms? (e.g., headache, stiff neck, dizziness, vomiting, runny nose, decreased hearing)     Behind right ear and down into right side of neck pain and swelling.  8. PREGNANCY: Is there any chance you are pregnant? When was your last menstrual period?     On continuous birth control and states has not had period in years.  Protocols used: [place]

## 2023-10-30 NOTE — Telephone Encounter (Signed)
 Spoke to pt. Schedule an appt with Dr. [place] for Monday at 3:30pm. Advise pt to proceed to urgent care if sx worsen. Pt verbalized understanding.

## 2023-11-02 ENCOUNTER — Ambulatory Visit: Payer: Self-pay | Admitting: Family Medicine

## 2023-11-02 VITALS — BP 118/70 | HR 60 | Temp 99.0°F | Resp 12 | Ht 59.5 in | Wt 111.0 lb

## 2023-11-02 DIAGNOSIS — H65191 Other acute nonsuppurative otitis media, right ear: Secondary | ICD-10-CM

## 2023-11-02 DIAGNOSIS — H60501 Unspecified acute noninfective otitis externa, right ear: Secondary | ICD-10-CM | POA: Diagnosis not present

## 2023-11-02 MED ORDER — CIPROFLOXACIN-DEXAMETHASONE 0.3-0.1 % OT SUSP: 4.0000 [drp] | Freq: Two times a day (BID) | OTIC | 0 refills | Status: AC

## 2023-11-02 MED ORDER — AMOXICILLIN-POT CLAVULANATE 875-125 MG PO TABS: 1.0000 | ORAL_TABLET | Freq: Two times a day (BID) | ORAL | 0 refills | Status: AC

## 2023-11-02 NOTE — Progress Notes (Signed)
 ACUTE VISIT Chief Complaint  Patient presents with   Ear Pain    Right ear, started last Monday   HPI: Rebekah Tate is a 28 y.o. female with a PMHx significant for MDD, ADD, low back pain, and insomnia, among some, who is here today with above complaint.  Patient complains of right ear pain and pain behind and around her ear since 2/3 She also had some headaches throughout last week.  Over the weekend, she started to have some right maxillary sinus pressure.  Otalgia  There is pain in the right ear. This is a new problem. The current episode started in the past 7 days. The problem occurs constantly. The problem has been unchanged. There has been no fever. The pain is moderate. Associated symptoms include headaches. Pertinent negatives include no abdominal pain, diarrhea, hearing loss, neck pain, rash or vomiting. She has tried NSAIDs for the symptoms. The treatment provided mild relief. Her past medical history is significant for a tympanostomy tube. There is no history of hearing loss.   She has had ear problems throughout her life, including ear tubes as a child.  She doesn't think she has had ear drainage or hearing changes. The pain has been unchanged since onset.   She took Dayquil yesterday and had been taking Aleve last week for frontal headache.  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   Pertinent negatives include recent URI, fever, chills, sore throat, rhinorrhea, body aches, cough, wheezing, SOB, or nausea.   Review of Systems  Constitutional:  Negative for activity change, appetite change and chills.  HENT:  Positive for ear pain. Negative for hearing loss, mouth sores, nosebleeds, sneezing and tinnitus.   Eyes:  Negative for discharge, redness and itching.  Gastrointestinal:  Negative for abdominal pain, diarrhea and vomiting.  Musculoskeletal:  Negative for gait problem and neck pain.  Skin:  Negative for rash.   Allergic/Immunologic: Positive for environmental allergies.  Neurological:  Positive for headaches. Negative for dizziness, facial asymmetry and weakness.  Hematological:  Positive for adenopathy.  See other pertinent positives and negatives in HPI.  Current Outpatient Medications on File Prior to Visit  Medication Sig Dispense Refill   albuterol  (VENTOLIN  HFA) 108 (90 Base) MCG/ACT inhaler Inhale 2 puffs into the lungs daily as needed for wheezing or shortness of breath. 18 g 2   dextroamphetamine  (DEXTROSTAT ) 5 MG tablet Take 1 tablet (5 mg total) by mouth in the morning and at noon. 60 tablet 0   Dextromethorphan -buPROPion  ER (AUVELITY ) 45-105 MG TBCR 1 tab by mouth twice per day 60 tablet 11   Dextromethorphan -buPROPion  ER (AUVELITY ) 45-105 MG TBCR Take 1 tablet by mouth 2 (two) times daily at least 8 hours apart 60 tablet 12   Dextromethorphan -buPROPion  ER (AUVELITY ) 45-105 MG TBCR Take 1 tablet by mouth 2 (two) times daily at least 8 hours apart 60 tablet 2   docusate sodium  (COLACE) 100 MG capsule Take 1 capsule (100 mg total) by mouth 2 (two) times daily. (Patient taking differently: Take 100 mg by mouth as needed.) 60 capsule 3   norethindrone -ethinyl estradiol -FE (BLISOVI  FE 1/20) 1-20 MG-MCG tablet Take 1 tablet by mouth daily. Take active tabs continuously , no placebos 112 tablet 3   tiZANidine  (ZANAFLEX ) 4 MG tablet Take one or two tabs at bedtime as directed if needed for muscle spasm 30 tablet 1   No current facility-administered medications on file prior to visit.  Past Medical History:  Diagnosis Date   Allergy    Anxiety    Asthma    As a child uses as needed albuterol  when flares exercise and respiratory infections.   Depression    meds since 9th grade  dr Hubert Madden   Scoliosis    Seasonal allergies    Allergies  Allergen Reactions   Cat Dander Other (See Comments)    Sneezing, rash, hives    Social History   Socioeconomic History   Marital status: Single     Spouse name: Not on file   Number of children: Not on file   Years of education: Not on file   Highest education level: Bachelor's degree (e.g., BA, AB, BS)  Occupational History   Occupation: Student  Tobacco Use   Smoking status: Never   Smokeless tobacco: Never  Vaping Use   Vaping status: Never Used  Substance and Sexual Activity   Alcohol use: No    Alcohol/week: 0.0 standard drinks of alcohol   Drug use: No   Sexual activity: Never    Birth control/protection: Pill  Other Topics Concern   Not on file  Social History Narrative   Adopted from [place] when she was [age] year old. Intact family.   Attended [employer]. Went to [employer] for 2 years and didn't like it although good grades early of education.   Applying to other colleges considering state and [employer]   Negative TAD does regular exercise reports 2-3 hours of sleep; lives with parents and a dog at this time feels like she has a healthy situation   GOP 0 last Pap 06/20/2016   Social Drivers of Health   Financial Resource Strain: Medium Risk (11/02/2023)   Overall Financial Resource Strain (CARDIA)    Difficulty of Paying Living Expenses: Somewhat hard  Food Insecurity: No Food Insecurity (11/02/2023)   Hunger Vital Sign    Worried About Running Out of Food in the Last Year: Never true    Ran Out of Food in the Last Year: Never true  Transportation Needs: No Transportation Needs (11/02/2023)   PRAPARE - Administrator, Civil Service (Medical): No    Lack of Transportation (Non-Medical): No  Physical Activity: Sufficiently Active (11/02/2023)   Exercise Vital Sign    Days of Exercise per Week: 5 days    Minutes of Exercise per Session: 60 min  Stress: No Stress Concern Present (11/02/2023)   [employer] of Occupational Health - Occupational Stress Questionnaire    Feeling of Stress : Only a little  Social Connections: Moderately Isolated (11/02/2023)   Social Connection and Isolation Panel  [NHANES]    Frequency of Communication with Friends and Family: More than three times a week    Frequency of Social Gatherings with Friends and Family: Three times a week    Attends Religious Services: Never    Active Member of Clubs or Organizations: No    Attends Banker Meetings: Never    Marital Status: Living with partner    Vitals:   11/02/23 1503  BP: 118/70  Pulse: 60  Resp: 12  Temp: 99 F (37.2 C)  SpO2: 97%   Body mass index is 22.04 kg/m.  Physical Exam Vitals and nursing note reviewed.  Constitutional:      General: She is not in acute distress.    Appearance: She is well-developed. She is not ill-appearing.  HENT:     Head: Normocephalic and atraumatic.  Right Ear: No middle ear effusion. No mastoid tenderness. Tympanic membrane is scarred and erythematous. Tympanic membrane is not bulging.     Left Ear: Tympanic membrane, ear canal and external ear normal.     Ears:     Comments: Right ear canal erythema and superficial excoriation around tragus. No drainage and no tenderness. Right TM mildly erythematous.    Nose:     Right Turbinates: Enlarged.     Left Turbinates: Enlarged.     Right Sinus: No maxillary sinus tenderness or frontal sinus tenderness.     Left Sinus: No maxillary sinus tenderness or frontal sinus tenderness.  Eyes:     Conjunctiva/sclera: Conjunctivae normal.  Cardiovascular:     Rate and Rhythm: Normal rate and regular rhythm.     Heart sounds: No murmur heard. Pulmonary:     Effort: Pulmonary effort is normal. No respiratory distress.     Breath sounds: Normal breath sounds. No stridor.  Musculoskeletal:     Cervical back: No edema or erythema. No muscular tenderness.  Lymphadenopathy:     Head:     Right side of head: Posterior auricular (Tender) adenopathy present.     Cervical: No cervical adenopathy.  Skin:    General: Skin is warm.     Findings: No erythema or rash.  Neurological:     Mental Status: She is  alert and oriented to person, place, and time.  Psychiatric:        Mood and Affect: Mood and affect normal.   ASSESSMENT AND PLAN:  Ms. Angermeier was seen today for possible ear infection.   Other non-recurrent acute nonsuppurative otitis media of right ear TM mildly erythematous, earache has been stable. She could hold on Augmentin  875-125 mg for for 48 hours and if not improved, she can start taking it. Some side effects discussed. Instructed about warning signs.  -     Amoxicillin -Pot Clavulanate; Take 1 tablet by mouth 2 (two) times daily for 7 days.  Dispense: 14 tablet; Refill: 0  Acute otitis externa of right ear, unspecified type With associated lymphadenopathy. Ciprodex  bid x 7-10 days recommended. F/u in 2-3 weeks if symptoms are not greatly improved, before if needed.  -     Ciprofloxacin -dexAMETHasone ; Place 4 drops into the right ear 2 (two) times daily.  Dispense: 7.5 mL; Refill: 0   Return if symptoms worsen or fail to improve.  I, Fritz Jewel Wierda, acting as a scribe for Niyana Chesbro [place], MD., have documented all relevant documentation on the behalf of Anshu Wehner [place], MD, as directed by  Jarryn Altland [place], MD while in the presence of Bunny Lowdermilk [place], MD.   I, Lucine Bilski [place], MD, have reviewed all documentation for this visit. The documentation on 11/02/23 for the exam, diagnosis, procedures, and orders are all accurate and complete.  Kiron Osmun G. [place], MD  [facility]. [place] office.

## 2023-11-02 NOTE — Patient Instructions (Addendum)
 A few things to remember from today's visit:  Earache on right  Other non-recurrent acute nonsuppurative otitis media of right ear - Plan: amoxicillin -clavulanate (AUGMENTIN ) 875-125 MG tablet  Acute otitis externa of right ear, unspecified type - Plan: ciprofloxacin -dexamethasone  (CIPRODEX ) OTIC suspension  Oral antibiotic for ear drum redness, it is very mild. Topical antibiotic for external canal inflammation. Monitor for new symptoms. F/U in 2-3 weeks if symptoms are not completely resolved.  Do not use My Chart to request refills or for acute issues that need immediate attention. If you send a my chart message, it may take a few days to be addressed, specially if I am not in the office.  Please be sure medication list is accurate. If a new problem present, please set up appointment sooner than planned today.

## 2023-12-09 DIAGNOSIS — F900 Attention-deficit hyperactivity disorder, predominantly inattentive type: Secondary | ICD-10-CM | POA: Diagnosis not present

## 2023-12-09 DIAGNOSIS — F411 Generalized anxiety disorder: Secondary | ICD-10-CM | POA: Diagnosis not present

## 2023-12-09 DIAGNOSIS — F3342 Major depressive disorder, recurrent, in full remission: Secondary | ICD-10-CM | POA: Diagnosis not present

## 2023-12-16 ENCOUNTER — Ambulatory Visit: Payer: Self-pay

## 2023-12-16 NOTE — Telephone Encounter (Signed)
 Chief Complaint: Chest pain Symptoms: tender to touch Frequency: x 4-5 days Pertinent Negatives: Patient denies fever, SOB, sweating, N/V Disposition: [] ED /[] Urgent Care (no appt availability in office) / [x] Appointment(In office/virtual)/ []  [facility]/ [] Home Care/ [] Refused Recommended Disposition /[] [facility]/ []  Follow-up with PCP Additional Notes: Pt reports left chest wall pain x 4-5 days. Denies SOB, vision changes, N/V. OV scheduled tomorrow. This RN educated pt on home care, new-worsening symptoms, when to call back/seek emergent care. Pt verbalized understanding and agrees to plan.   Reason for Disposition  [1] Chest pain(s) lasting a few seconds AND [2] persists > 3 days  Answer Assessment - Initial Assessment Questions 1. LOCATION: "Where does it hurt?"       Left chest under breast, rib 2. RADIATION: "Does the pain go anywhere else?" (e.g., into neck, jaw, arms, back)     Left jaw, doesn't feel it is radiating 3. ONSET: "When did the chest pain begin?" (Minutes, hours or days)      X 4-5 days 4. PATTERN: "Does the pain come and go, or has it been constant since it started?"  "Does it get worse with exertion?"      Intermittent, tender to touch 5. DURATION: "How long does it last" (e.g., seconds, minutes, hours)     Painful with touch 6. SEVERITY: "How bad is the pain?"  (e.g., Scale 1-10; mild, moderate, or severe)    - MILD (1-3): doesn't interfere with normal activities     - MODERATE (4-7): interferes with normal activities or awakens from sleep    - SEVERE (8-10): excruciating pain, unable to do any normal activities       7/10  9. CAUSE: "What do you think is causing the chest pain?"     Unknown 10. OTHER SYMPTOMS: "Do you have any other symptoms?" (e.g., dizziness, nausea, vomiting, sweating, fever, difficulty breathing, cough)       None  Protocols used: Chest Pain-A-AH

## 2023-12-17 ENCOUNTER — Ambulatory Visit: Admitting: Adult Health

## 2023-12-17 VITALS — BP 110/80 | HR 58 | Temp 99.1°F | Ht 59.5 in | Wt 107.0 lb

## 2023-12-17 DIAGNOSIS — R109 Unspecified abdominal pain: Secondary | ICD-10-CM

## 2023-12-17 DIAGNOSIS — S0340XA Sprain of jaw, unspecified side, initial encounter: Secondary | ICD-10-CM | POA: Diagnosis not present

## 2023-12-17 MED ORDER — METHYLPREDNISOLONE 4 MG PO TBPK: ORAL_TABLET | ORAL | 0 refills | Status: AC

## 2023-12-17 MED ORDER — CYCLOBENZAPRINE HCL 10 MG PO TABS: 10.0000 mg | ORAL_TABLET | Freq: Every day | ORAL | 0 refills | Status: AC

## 2023-12-17 NOTE — Progress Notes (Signed)
 Subjective:    Patient ID: Rebekah Tate, female    DOB: 1996/07/25, 28 y.o.   MRN: [id number]  HPI  28 year old female who  has a past medical history of Allergy, Anxiety, Asthma, Depression, Scoliosis, and Seasonal allergies.  She presents to the office today for an acute visit   Reports that about 5 to 6 days ago she developed pain to her left upper chest.  This is tender to touch and is worse with stretching exercises such as bringing her left arm over her head.  She has not noticed any discoloration or rashes or masses.  Denies injury or trauma.  She does ride a Building control surveyor bike but does not remember any pain after she rode her bike 5 to 6 days ago.  Additionally at about the same time she developed pain to the left side of her jaw to the point where she has difficulty opening her mouth.  She does feel a mild clicking sensation but does not really have any pain with chewing. She does grind her teeth.   Home she has been using Aleve and a heating pad without relief.  Review of Systems See HPI   Past Medical History:  Diagnosis Date   Allergy    Anxiety    Asthma    As a child uses as needed albuterol when flares exercise and respiratory infections.   Depression    meds since 9th grade  dr Lucianne Muss   Scoliosis    Seasonal allergies     Social History   Socioeconomic History   Marital status: Single    Spouse name: Not on file   Number of children: Not on file   Years of education: Not on file   Highest education level: Bachelor's degree (e.g., BA, AB, BS)  Occupational History   Occupation: Student  Tobacco Use   Smoking status: Never   Smokeless tobacco: Never  Vaping Use   Vaping status: Never Used  Substance and Sexual Activity   Alcohol use: No    Alcohol/week: 0.0 standard drinks of alcohol   Drug use: No   Sexual activity: Never    Birth control/protection: Pill  Other Topics Concern   Not on file  Social History Narrative   Adopted from [place] when she was  [age] year old. Intact family.   Attended [employer]. Went to [employer] for 2 years and didn't like it although good grades early of education.   Applying to other colleges considering state and [employer]   Negative TAD does regular exercise reports 2-3 hours of sleep; lives with parents and a dog at this time feels like she has a healthy situation   GOP 0 last Pap 06/20/2016   Social Drivers of Health   Financial Resource Strain: Medium Risk (11/02/2023)   Overall Financial Resource Strain (CARDIA)    Difficulty of Paying Living Expenses: Somewhat hard  Food Insecurity: No Food Insecurity (11/02/2023)   Hunger Vital Sign    Worried About Running Out of Food in the Last Year: Never true    Ran Out of Food in the Last Year: Never true  Transportation Needs: No Transportation Needs (11/02/2023)   PRAPARE - Administrator, Civil Service (Medical): No    Lack of Transportation (Non-Medical): No  Physical Activity: Sufficiently Active (11/02/2023)   Exercise Vital Sign    Days of Exercise per Week: 5 days    Minutes of Exercise per Session: 60 min  Stress:  No Stress Concern Present (11/02/2023)   [employer] of Occupational Health - Occupational Stress Questionnaire    Feeling of Stress : Only a little  Social Connections: Moderately Isolated (11/02/2023)   Social Connection and Isolation Panel [NHANES]    Frequency of Communication with Friends and Family: More than three times a week    Frequency of Social Gatherings with Friends and Family: Three times a week    Attends Religious Services: Never    Active Member of Clubs or Organizations: No    Attends Banker Meetings: Never    Marital Status: Living with partner  Intimate Partner Violence: Not At Risk (03/17/2023)   Humiliation, Afraid, Rape, and Kick questionnaire    Fear of Current or Ex-Partner: No    Emotionally Abused: No    Physically Abused: No    Sexually Abused: No    Past Surgical  History:  Procedure Laterality Date   ANAL RECTAL MANOMETRY N/A 10/10/2019   Procedure: ANO RECTAL MANOMETRY;  Surgeon: Shellia Cleverly, DO;  Location: [facility];  Service: Gastroenterology;  Laterality: N/A;   WISDOM TOOTH EXTRACTION      Family History  Adopted: Yes  Family history unknown: Yes    Allergies  Allergen Reactions   Cat Dander Other (See Comments)    Sneezing, rash, hives    Current Outpatient Medications on File Prior to Visit  Medication Sig Dispense Refill   albuterol (VENTOLIN HFA) 108 (90 Base) MCG/ACT inhaler Inhale 2 puffs into the lungs daily as needed for wheezing or shortness of breath. 18 g 2   ciprofloxacin-dexamethasone (CIPRODEX) OTIC suspension Place 4 drops into the right ear 2 (two) times daily. 7.5 mL 0   dextroamphetamine (DEXTROSTAT) 5 MG tablet Take 1 tablet (5 mg total) by mouth in the morning and at noon. 60 tablet 0   Dextromethorphan-buPROPion ER (AUVELITY) 45-105 MG TBCR 1 tab by mouth twice per day 60 tablet 11   Dextromethorphan-buPROPion ER (AUVELITY) 45-105 MG TBCR Take 1 tablet by mouth 2 (two) times daily at least 8 hours apart 60 tablet 12   Dextromethorphan-buPROPion ER (AUVELITY) 45-105 MG TBCR Take 1 tablet by mouth 2 (two) times daily at least 8 hours apart 60 tablet 2   docusate sodium (COLACE) 100 MG capsule Take 1 capsule (100 mg total) by mouth 2 (two) times daily. (Patient taking differently: Take 100 mg by mouth as needed.) 60 capsule 3   norethindrone-ethinyl estradiol-FE (BLISOVI FE 1/20) 1-20 MG-MCG tablet Take 1 tablet by mouth daily. Take active tabs continuously , no placebos 112 tablet 3   tiZANidine (ZANAFLEX) 4 MG tablet Take one or two tabs at bedtime as directed if needed for muscle spasm 30 tablet 1   No current facility-administered medications on file prior to visit.    BP 110/80   Pulse (!) 58   Temp 99.1 F (37.3 C) (Oral)   Ht 4' 11.5" (1.511 m)   Wt 107 lb (48.5 kg)   SpO2 98%   BMI 21.25 kg/m        Objective:   Physical Exam Vitals and nursing note reviewed.  Constitutional:      Appearance: Normal appearance.  HENT:     Head:     Jaw: Tenderness and pain on movement present. No swelling.      Comments: + clicking sensation  Cardiovascular:     Rate and Rhythm: Normal rate and regular rhythm.     Pulses: Normal pulses.  Heart sounds: Normal heart sounds.  Pulmonary:     Effort: Pulmonary effort is normal.     Breath sounds: Normal breath sounds.  Chest:       Comments: No redness, warmth, rash, or mass noted  Musculoskeletal:        General: Normal range of motion.  Skin:    General: Skin is warm and dry.  Neurological:     General: No focal deficit present.     Mental Status: She is alert and oriented to person, place, and time.  Psychiatric:        Mood and Affect: Mood normal.        Behavior: Behavior normal.        Thought Content: Thought content normal.        Judgment: Judgment normal.        Assessment & Plan:  1. Left flank pain (Primary) - Appears as costochondritis. Will send in Flexeril and medrol dose pack   - cyclobenzaprine (FLEXERIL) 10 MG tablet; Take 1 tablet (10 mg total) by mouth at bedtime.  Dispense: 15 tablet; Refill: 0 - methylPREDNISolone (MEDROL DOSEPAK) 4 MG TBPK tablet; Take as directed  Dispense: 21 tablet; Refill: 0 - Follow up if not improving in the next 2-3 days   2. Sprain of temporomandibular joint, initial encounter - Will treat suspected TMJ disorder with Flexeril and Medrol Dose pack. Can consider referral to ENT - She is not going to take her Tizanidine at night when she takes Flexeril.  - cyclobenzaprine (FLEXERIL) 10 MG tablet; Take 1 tablet (10 mg total) by mouth at bedtime.  Dispense: 15 tablet; Refill: 0 - methylPREDNISolone (MEDROL DOSEPAK) 4 MG TBPK tablet; Take as directed  Dispense: 21 tablet; Refill: 0  Shirline Frees, NP  Time spent with patient today was 32 minutes which consisted of chart  review, discussing Costochondritis and TMJ, work up, treatment answering questions and documentation.

## 2023-12-17 NOTE — Telephone Encounter (Signed)
 Disregard the 1st message, pt has appointment scheduled with Kandee Keen this afternoon at 3 pm.

## 2023-12-18 ENCOUNTER — Ambulatory Visit: Admitting: Family Medicine

## 2024-01-25 DIAGNOSIS — Q101 Congenital ectropion: Secondary | ICD-10-CM | POA: Diagnosis not present

## 2024-05-13 ENCOUNTER — Ambulatory Visit

## 2024-05-16 ENCOUNTER — Other Ambulatory Visit: Payer: Self-pay

## 2024-05-16 ENCOUNTER — Ambulatory Visit (INDEPENDENT_AMBULATORY_CARE_PROVIDER_SITE_OTHER)

## 2024-05-16 DIAGNOSIS — Z111 Encounter for screening for respiratory tuberculosis: Secondary | ICD-10-CM

## 2024-05-16 NOTE — Progress Notes (Signed)
 Pt is here for ppd placement for work. Pt would like to do TB gold quantiferon instead due to her working schedule.

## 2024-05-18 ENCOUNTER — Ambulatory Visit

## 2024-05-18 LAB — QUANTIFERON-TB GOLD PLUS
Mitogen-NIL: 7.92 [IU]/mL
NIL: 0.02 [IU]/mL
QuantiFERON-TB Gold Plus: NEGATIVE
TB1-NIL: 0 [IU]/mL
TB2-NIL: 0.01 [IU]/mL

## 2024-05-19 ENCOUNTER — Ambulatory Visit: Payer: Self-pay | Admitting: Internal Medicine

## 2024-05-19 NOTE — Progress Notes (Signed)
Negative tb screen

## 2024-06-01 DIAGNOSIS — F411 Generalized anxiety disorder: Secondary | ICD-10-CM | POA: Diagnosis not present

## 2024-06-01 DIAGNOSIS — F900 Attention-deficit hyperactivity disorder, predominantly inattentive type: Secondary | ICD-10-CM | POA: Diagnosis not present

## 2024-06-01 DIAGNOSIS — F3342 Major depressive disorder, recurrent, in full remission: Secondary | ICD-10-CM | POA: Diagnosis not present

## 2024-06-08 DIAGNOSIS — Z6822 Body mass index (BMI) 22.0-22.9, adult: Secondary | ICD-10-CM | POA: Diagnosis not present

## 2024-06-08 DIAGNOSIS — Z124 Encounter for screening for malignant neoplasm of cervix: Secondary | ICD-10-CM | POA: Diagnosis not present

## 2024-06-08 DIAGNOSIS — Z760 Encounter for issue of repeat prescription: Secondary | ICD-10-CM | POA: Diagnosis not present

## 2024-06-08 DIAGNOSIS — Z01419 Encounter for gynecological examination (general) (routine) without abnormal findings: Secondary | ICD-10-CM | POA: Diagnosis not present

## 2024-06-08 LAB — HM PAP SMEAR: HM Pap smear: NEGATIVE

## 2024-06-21 ENCOUNTER — Ambulatory Visit (INDEPENDENT_AMBULATORY_CARE_PROVIDER_SITE_OTHER): Admitting: Internal Medicine

## 2024-06-21 ENCOUNTER — Encounter: Payer: Self-pay | Admitting: Internal Medicine

## 2024-06-21 VITALS — BP 96/66 | HR 64 | Temp 98.8°F | Ht 58.82 in | Wt 109.8 lb

## 2024-06-21 DIAGNOSIS — Z0000 Encounter for general adult medical examination without abnormal findings: Secondary | ICD-10-CM

## 2024-06-21 MED ORDER — ALBUTEROL SULFATE HFA 108 (90 BASE) MCG/ACT IN AERS: 2.0000 | INHALATION_SPRAY | Freq: Every day | RESPIRATORY_TRACT | 2 refills | Status: AC | PRN

## 2024-06-21 NOTE — Progress Notes (Signed)
 Chief Complaint  Patient presents with   Annual Exam    HPI: Patient  Rebekah Tate  28 y.o. 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  No physical limtiation 'uses albuteral pre exercise  EIA if needed rare use but woul dlike refill.  Allergic to cats  Has had gyne check dr Latisha  Tendd to faint unless hydrated other for blood draws but  doing ok   Health Maintenance  Topic Date Due   HIV Screening  Never done   Hepatitis C Screening  Never done   Cervical Cancer Screening (Pap smear)  12/19/2024 (Originally 06/04/2016)   Pneumococcal Vaccine (1 of 2 - PCV) 06/21/2025 (Originally 06/04/2014)   DTaP/Tdap/Td (8 - Td or Tdap) 03/16/2033   Influenza Vaccine  Completed   HPV VACCINES  Completed   COVID-19 Vaccine  Completed   Meningococcal B Vaccine  Aged Out   Health Maintenance Review LIFESTYLE:  Exercise:  active  Tobacco/ETS:n Alcohol: n Sugar beverages: Sleep:7-8 Drug use: no Work:FT  ROS:  Controlled add and mood dep anx doing well in remission  hx of urticaria IMM/ Allergy: No unusual infections.  Allergy .   REST of 12 system review negative except as per HPI   Past Medical History:  Diagnosis Date   Allergy    Anxiety    Asthma    As a child uses as needed albuterol  when flares exercise and respiratory infections.   Depression    meds since 9th grade  dr von   Scoliosis    Seasonal allergies     Past Surgical History:  Procedure Laterality Date   ANAL RECTAL MANOMETRY N/A 10/10/2019   Procedure: ANO RECTAL MANOMETRY;  Surgeon: San Sandor GAILS, DO;  Location: [facility];  Service: Gastroenterology;  Laterality: N/A;   WISDOM TOOTH EXTRACTION      Family History  Adopted: Yes  Family history unknown: Yes    Social History   Socioeconomic History   Marital  status: Single    Spouse name: Not on file   Number of children: Not on file   Years of education: Not on file   Highest education level: Bachelor's degree (e.g., BA, AB, BS)  Occupational History   Occupation: Student  Tobacco Use   Smoking status: Never   Smokeless tobacco: Never  Vaping Use   Vaping status: Never Used  Substance and Sexual Activity   Alcohol use: No    Alcohol/week: 0.0 standard drinks of alcohol   Drug use: No   Sexual activity: Never    Birth control/protection: Pill  Other Topics Concern   Not on file  Social History Narrative   Adopted from [place] when she was [age] year old. Intact family.   Attended [employer] day school. Went to [employer] for 2 years and didn't like it although good grades early of education.   Applying to other colleges considering state and [employer]   Negative TAD does regular exercise reports 2-3 hours of sleep; lives with parents and a dog at this time feels like she has a healthy situation   GOP 0 last Pap 06/20/2016   Social Drivers of Health   Financial Resource Strain: Medium Risk (06/17/2024)   Overall Financial Resource Strain (CARDIA)  Difficulty of Paying Living Expenses: Somewhat hard  Food Insecurity: No Food Insecurity (06/17/2024)   Hunger Vital Sign    Worried About Running Out of Food in the Last Year: Never true    Ran Out of Food in the Last Year: Never true  Transportation Needs: No Transportation Needs (06/17/2024)   PRAPARE - Administrator, Civil Service (Medical): No    Lack of Transportation (Non-Medical): No  Physical Activity: Sufficiently Active (06/17/2024)   Exercise Vital Sign    Days of Exercise per Week: 5 days    Minutes of Exercise per Session: 60 min  Stress: Stress Concern Present (06/17/2024)   [employer] of Occupational Health - Occupational Stress Questionnaire    Feeling of Stress: To some extent  Social Connections: Moderately Isolated (06/17/2024)   Social Connection and  Isolation Panel    Frequency of Communication with Friends and Family: More than three times a week    Frequency of Social Gatherings with Friends and Family: Three times a week    Attends Religious Services: Never    Active Member of Clubs or Organizations: No    Attends Engineer, structural: Not on file    Marital Status: Living with partner    Outpatient Medications Prior to Visit  Medication Sig Dispense Refill   ciprofloxacin -dexamethasone  (CIPRODEX ) OTIC suspension Place 4 drops into the right ear 2 (two) times daily. 7.5 mL 0   dextroamphetamine  (DEXTROSTAT ) 5 MG tablet Take 1 tablet (5 mg total) by mouth in the morning and at noon. 60 tablet 0   Dextromethorphan -buPROPion  ER (AUVELITY ) 45-105 MG TBCR 1 tab by mouth twice per day 60 tablet 11   Dextromethorphan -buPROPion  ER (AUVELITY ) 45-105 MG TBCR Take 1 tablet by mouth 2 (two) times daily at least 8 hours apart 60 tablet 12   Dextromethorphan -buPROPion  ER (AUVELITY ) 45-105 MG TBCR Take 1 tablet by mouth 2 (two) times daily at least 8 hours apart 60 tablet 2   norethindrone -ethinyl estradiol -FE (BLISOVI  FE 1/20) 1-20 MG-MCG tablet Take 1 tablet by mouth daily. Take active tabs continuously , no placebos 112 tablet 3   tiZANidine  (ZANAFLEX ) 4 MG tablet Take one or two tabs at bedtime as directed if needed for muscle spasm 30 tablet 1   albuterol  (VENTOLIN  HFA) 108 (90 Base) MCG/ACT inhaler Inhale 2 puffs into the lungs daily as needed for wheezing or shortness of breath. 18 g 2   cyclobenzaprine  (FLEXERIL ) 10 MG tablet Take 1 tablet (10 mg total) by mouth at bedtime. (Patient not taking: Reported on 06/21/2024) 15 tablet 0   docusate sodium  (COLACE) 100 MG capsule Take 1 capsule (100 mg total) by mouth 2 (two) times daily. (Patient taking differently: Take 100 mg by mouth as needed.) 60 capsule 3   methylPREDNISolone  (MEDROL  DOSEPAK) 4 MG TBPK tablet Take as directed (Patient not taking: Reported on 06/21/2024) 21 tablet 0   No  facility-administered medications prior to visit.     EXAM:  BP 96/66 (BP Location: Left Arm, Patient Position: Sitting, Cuff Size: Normal)   Pulse 64   Temp 98.8 F (37.1 C) (Oral)   Ht 4' 10.82 (1.494 m)   Wt 109 lb 12.8 oz (49.8 kg)   SpO2 98%   BMI 22.31 kg/m   Body mass index is 22.31 kg/m. Wt Readings from Last 3 Encounters:  06/21/24 109 lb 12.8 oz (49.8 kg)  12/17/23 107 lb (48.5 kg)  11/02/23 111 lb (50.3 kg)    Physical Exam: Vital  signs reviewed HZW:Uypd is a well-developed well-nourished alert cooperative    who appearsr stated age in no acute distress.  HEENT: normocephalic atraumatic , Eyes: PERRL EOM's full, conjunctiva clear, Nares: paten,t no deformity discharge or tenderness., Ears: no deformity EAC's clear TMs with normal landmarks. Mouth: clear OP, no lesions, edema.  Moist mucous membranes. Dentition in adequate repair. NECK: supple without masses, thyromegaly or bruits. CHEST/PULM:  Clear to auscultation and percussion breath sounds equal no wheeze , rales or rhonchi. No chest wall deformities or tenderness. Breast: normal by inspection . No dimpling, discharge, masses, tenderness or discharge . CV: PMI is nondisplaced, S1 S2 no gallops, murmurs, rubs. Peripheral pulses are full without delay.No JVD .  ABDOMEN: Bowel sounds normal nontender  No guard or rebound, no hepato splenomegal no CVA tenderness.   Extremtities:  No clubbing cyanosis or edema, no acute joint swelling or redness no focal atrophy NEURO:  Oriented x3, cranial nerves 3-12 appear to be intact, no obvious focal weakness,gait within normal limits no abnormal reflexes or asymmetrical SKIN: No acute rashes normal turgor, color, no bruising or petechiae. Slight scoliosis   PSYCH: Oriented, good eye contact, no obvious depression anxiety, cognition and judgment appear normal. LN: no cervical axillary adenopathy  Lab Results  Component Value Date   WBC 5.4 07/26/2019   HGB 12.6 07/26/2019    HCT 38.9 07/26/2019   PLT 278.0 07/26/2019   GLUCOSE 90 07/26/2019   CHOL 157 08/21/2016   TRIG 81 08/21/2016   HDL 75 08/21/2016   LDLCALC 66 08/21/2016   ALT 14 08/21/2016   ALT 15 08/21/2016   AST 20 08/21/2016   AST 19 08/21/2016   NA 137 07/26/2019   K 3.9 07/26/2019   CL 104 07/26/2019   CREATININE 0.69 07/26/2019   BUN 14 07/26/2019   CO2 27 07/26/2019   TSH 2.20 07/26/2019    BP Readings from Last 3 Encounters:  06/21/24 96/66  12/17/23 110/80  11/02/23 118/70   ASSESSMENT AND PLAN:  Discussed the following assessment and plan:    ICD-10-CM   1. Visit for preventive health examination  Z00.00     Refill albuterol  and if need reg use then fu for  better controller management . Can take antihistamine before cat exposure.  Consider  lab screening  when wishes  call for orders .  For completed  no restrictions     Return for 1-2 years if  in with your gyne.  Patient Care Team: Waneta Fitting, Apolinar POUR, MD as PCP - General (Internal Medicine) Latisha Medford, MD as Consulting Physician (Obstetrics and Gynecology) Von Piggs, MD (Inactive) as Consulting Physician (Psychiatry) Vincente Grip, MD as Consulting Physician (Psychiatry) Patient Instructions  Good to see you today.  Continue lifestyle intervention healthy eating and exercise .   Back hygiene .   When you feel  you wish contact and we can order cholesterol blood sugar anemia check  screens .  And any other needed screens.    Ayanah Snader K. Oracio Galen M.D.

## 2024-06-21 NOTE — Patient Instructions (Addendum)
 Good to see you today.  Continue lifestyle intervention healthy eating and exercise .   Back hygiene .   When you feel  you wish contact and we can order cholesterol blood sugar anemia check  screens .  And any other needed screens.

## 2024-06-28 ENCOUNTER — Encounter: Payer: Self-pay | Admitting: Internal Medicine
